# Patient Record
Sex: Male | Born: 1960 | Race: White | Hispanic: No | Marital: Single | Smoking: Never smoker
Health system: Southern US, Community
[De-identification: ages and names within clinical notes are randomized; demographics above are authoritative.]

## PROBLEM LIST (undated history)

## (undated) DIAGNOSIS — J069 Acute upper respiratory infection, unspecified: Secondary | ICD-10-CM

## (undated) DIAGNOSIS — E785 Hyperlipidemia, unspecified: Secondary | ICD-10-CM

## (undated) DIAGNOSIS — K219 Gastro-esophageal reflux disease without esophagitis: Secondary | ICD-10-CM

## (undated) DIAGNOSIS — J45909 Unspecified asthma, uncomplicated: Secondary | ICD-10-CM

## (undated) DIAGNOSIS — E669 Obesity, unspecified: Secondary | ICD-10-CM

## (undated) DIAGNOSIS — I10 Essential (primary) hypertension: Secondary | ICD-10-CM

## (undated) DIAGNOSIS — R0789 Other chest pain: Secondary | ICD-10-CM

## (undated) DIAGNOSIS — I2510 Atherosclerotic heart disease of native coronary artery without angina pectoris: Secondary | ICD-10-CM

## (undated) DIAGNOSIS — E876 Hypokalemia: Secondary | ICD-10-CM

## (undated) HISTORY — DX: Obesity, unspecified: E66.9

## (undated) HISTORY — PX: ADENOIDECTOMY: SUR15

## (undated) HISTORY — DX: Other chest pain: R07.89

## (undated) HISTORY — DX: Unspecified asthma, uncomplicated: J45.909

## (undated) HISTORY — PX: CARDIAC CATHETERIZATION: SHX172

## (undated) HISTORY — DX: Hypokalemia: E87.6

## (undated) HISTORY — DX: Acute upper respiratory infection, unspecified: J06.9

## (undated) HISTORY — PX: HERNIA REPAIR: SHX51

---

## 2000-09-23 ENCOUNTER — Emergency Department (HOSPITAL_COMMUNITY): Admission: EM | Admit: 2000-09-23 | Discharge: 2000-09-23 | Payer: Self-pay | Admitting: Emergency Medicine

## 2007-02-01 ENCOUNTER — Encounter: Admission: RE | Admit: 2007-02-01 | Discharge: 2007-02-01 | Payer: Self-pay | Admitting: Allergy and Immunology

## 2012-10-17 ENCOUNTER — Emergency Department (HOSPITAL_COMMUNITY): Payer: Non-veteran care

## 2012-10-17 ENCOUNTER — Encounter (HOSPITAL_COMMUNITY): Payer: Self-pay | Admitting: Emergency Medicine

## 2012-10-17 ENCOUNTER — Observation Stay (HOSPITAL_COMMUNITY)
Admission: EM | Admit: 2012-10-17 | Discharge: 2012-10-18 | Disposition: A | Discharge status: Home / Self Care | Payer: Non-veteran care | Attending: Internal Medicine | Admitting: Internal Medicine

## 2012-10-17 DIAGNOSIS — R0789 Other chest pain: Secondary | ICD-10-CM | POA: Diagnosis present

## 2012-10-17 DIAGNOSIS — Z79899 Other long term (current) drug therapy: Secondary | ICD-10-CM | POA: Diagnosis not present

## 2012-10-17 DIAGNOSIS — J309 Allergic rhinitis, unspecified: Secondary | ICD-10-CM | POA: Diagnosis not present

## 2012-10-17 DIAGNOSIS — J45909 Unspecified asthma, uncomplicated: Secondary | ICD-10-CM | POA: Insufficient documentation

## 2012-10-17 DIAGNOSIS — Z7982 Long term (current) use of aspirin: Secondary | ICD-10-CM | POA: Diagnosis not present

## 2012-10-17 DIAGNOSIS — R079 Chest pain, unspecified: Secondary | ICD-10-CM | POA: Diagnosis present

## 2012-10-17 DIAGNOSIS — E669 Obesity, unspecified: Secondary | ICD-10-CM | POA: Insufficient documentation

## 2012-10-17 DIAGNOSIS — I10 Essential (primary) hypertension: Secondary | ICD-10-CM | POA: Diagnosis not present

## 2012-10-17 DIAGNOSIS — Z6837 Body mass index (BMI) 37.0-37.9, adult: Secondary | ICD-10-CM | POA: Insufficient documentation

## 2012-10-17 DIAGNOSIS — E876 Hypokalemia: Secondary | ICD-10-CM | POA: Insufficient documentation

## 2012-10-17 DIAGNOSIS — K219 Gastro-esophageal reflux disease without esophagitis: Secondary | ICD-10-CM | POA: Diagnosis not present

## 2012-10-17 HISTORY — DX: Gastro-esophageal reflux disease without esophagitis: K21.9

## 2012-10-17 HISTORY — DX: Essential (primary) hypertension: I10

## 2012-10-17 LAB — CBC
HCT: 38.7 % — ABNORMAL LOW (ref 39.0–52.0)
MCV: 85.6 fL (ref 78.0–100.0)
RDW: 12.9 % (ref 11.5–15.5)
WBC: 8.3 10*3/uL (ref 4.0–10.5)

## 2012-10-17 LAB — POCT I-STAT, CHEM 8
Chloride: 104 mEq/L (ref 96–112)
Creatinine, Ser: 1.2 mg/dL (ref 0.50–1.35)
Glucose, Bld: 144 mg/dL — ABNORMAL HIGH (ref 70–99)
Hemoglobin: 13.9 g/dL (ref 13.0–17.0)
Potassium: 3.4 mEq/L — ABNORMAL LOW (ref 3.5–5.1)

## 2012-10-17 LAB — TROPONIN I: Troponin I: <0.3 ng/mL (ref <0.30)

## 2012-10-17 MED ORDER — ACETAMINOPHEN 650 MG RE SUPP: 650.0000 mg | Freq: Four times a day (QID) | RECTAL | Status: DC | PRN

## 2012-10-17 MED ORDER — MORPHINE SULFATE 4 MG/ML IJ SOLN: 4.0000 mg | Freq: Once | INTRAMUSCULAR | Status: DC

## 2012-10-17 MED ORDER — ENOXAPARIN SODIUM 40 MG/0.4ML ~~LOC~~ SOLN
40.0000 mg | SUBCUTANEOUS | Status: DC
  Administered 2012-10-17: 40 mg via SUBCUTANEOUS
  Filled 2012-10-17 (×2): qty 0.4

## 2012-10-17 MED ORDER — MORPHINE SULFATE 2 MG/ML IJ SOLN: 2.0000 mg | INTRAMUSCULAR | Status: DC | PRN

## 2012-10-17 MED ORDER — POTASSIUM CHLORIDE CRYS ER 20 MEQ PO TBCR
40.0000 meq | EXTENDED_RELEASE_TABLET | Freq: Once | ORAL | Status: AC
  Administered 2012-10-17: 40 meq via ORAL
  Filled 2012-10-17: qty 2

## 2012-10-17 MED ORDER — ONDANSETRON HCL 4 MG PO TABS: 4.0000 mg | ORAL_TABLET | Freq: Four times a day (QID) | ORAL | Status: DC | PRN

## 2012-10-17 MED ORDER — OMEGA-3 FATTY ACIDS 1000 MG PO CAPS: 1.0000 g | ORAL_CAPSULE | Freq: Every day | ORAL | Status: DC

## 2012-10-17 MED ORDER — ACETAMINOPHEN 325 MG PO TABS: 650.0000 mg | ORAL_TABLET | Freq: Four times a day (QID) | ORAL | Status: DC | PRN

## 2012-10-17 MED ORDER — IOHEXOL 350 MG/ML SOLN
80.0000 mL | Freq: Once | INTRAVENOUS | Status: AC | PRN
  Administered 2012-10-17: 80 mL via INTRAVENOUS

## 2012-10-17 MED ORDER — HYDROCHLOROTHIAZIDE 25 MG PO TABS
12.5000 mg | ORAL_TABLET | Freq: Every day | ORAL | Status: DC
  Filled 2012-10-17: qty 0.5

## 2012-10-17 MED ORDER — ONDANSETRON HCL 4 MG/2ML IJ SOLN: 4.0000 mg | Freq: Four times a day (QID) | INTRAMUSCULAR | Status: DC | PRN

## 2012-10-17 MED ORDER — ALUM & MAG HYDROXIDE-SIMETH 200-200-20 MG/5ML PO SUSP: 30.0000 mL | Freq: Four times a day (QID) | ORAL | Status: DC | PRN

## 2012-10-17 MED ORDER — PANTOPRAZOLE SODIUM 40 MG PO TBEC
40.0000 mg | DELAYED_RELEASE_TABLET | Freq: Every day | ORAL | Status: DC
  Administered 2012-10-17 – 2012-10-18 (×2): 40 mg via ORAL
  Filled 2012-10-17 (×2): qty 1

## 2012-10-17 MED ORDER — ASPIRIN EC 325 MG PO TBEC
325.0000 mg | DELAYED_RELEASE_TABLET | Freq: Every day | ORAL | Status: DC
  Administered 2012-10-18: 325 mg via ORAL
  Filled 2012-10-17: qty 1

## 2012-10-17 MED ORDER — HYDROCHLOROTHIAZIDE 12.5 MG PO CAPS
12.5000 mg | ORAL_CAPSULE | Freq: Every day | ORAL | Status: DC
  Administered 2012-10-17 – 2012-10-18 (×2): 12.5 mg via ORAL
  Filled 2012-10-17 (×3): qty 1

## 2012-10-17 MED ORDER — OMEGA-3-ACID ETHYL ESTERS 1 G PO CAPS
1.0000 g | ORAL_CAPSULE | Freq: Every day | ORAL | Status: DC
  Administered 2012-10-17 – 2012-10-18 (×2): 1 g via ORAL
  Filled 2012-10-17 (×2): qty 1

## 2012-10-17 MED ORDER — IOHEXOL 350 MG/ML SOLN
100.0000 mL | Freq: Once | INTRAVENOUS | Status: AC | PRN
  Administered 2012-10-17: 100 mL via INTRAVENOUS

## 2012-10-17 MED ORDER — NITROGLYCERIN 0.4 MG SL SUBL
0.4000 mg | SUBLINGUAL_TABLET | SUBLINGUAL | Status: DC | PRN
  Filled 2012-10-17: qty 25

## 2012-10-17 MED ORDER — LORATADINE 10 MG PO TABS
10.0000 mg | ORAL_TABLET | Freq: Every day | ORAL | Status: DC
  Administered 2012-10-17 – 2012-10-18 (×2): 10 mg via ORAL
  Filled 2012-10-17 (×2): qty 1

## 2012-10-17 MED ORDER — SODIUM CHLORIDE 0.9 % IJ SOLN
3.0000 mL | Freq: Two times a day (BID) | INTRAMUSCULAR | Status: DC
  Administered 2012-10-17 – 2012-10-18 (×3): 3 mL via INTRAVENOUS

## 2012-10-17 MED ORDER — ASPIRIN 81 MG PO CHEW
324.0000 mg | CHEWABLE_TABLET | Freq: Once | ORAL | Status: AC
  Administered 2012-10-17: 324 mg via ORAL
  Filled 2012-10-17: qty 4

## 2012-10-17 NOTE — ED Notes (Signed)
Pt states that he gave platelets on the 21 of feb and was seen at the [place] after that and had blood drawn for his physical. States takes 1/2 HCTZ for bp med. Pt aaox4 MAE w/ purpose able to ambulate w/o diff

## 2012-10-17 NOTE — ED Notes (Signed)
Report given Lelon Mast, RN. Nurse has no further questions upon report given. Pt being transported to floor by Beaulah Dinning, EMT

## 2012-10-17 NOTE — ED Notes (Signed)
Pt states he was unable to sleep last night and thought it was just indigestion and tried throwing up but was unable to. Pt alert and resting in bed currently. Pt does not show signs of acute distress.

## 2012-10-17 NOTE — ED Notes (Signed)
Tightness in chest was uncomfortable trying to sleep lasr night could not sleep on back  Or side  Some nausea no sob

## 2012-10-17 NOTE — H&P (Addendum)
[facility] History and Physical  Harold Warner ZOX:[id number] DOB: [birth year] DOA: 10/17/2012  Referring physician: Dr. Eber Hong PCP: Jyl Heinz, MD /Dr. Lupita Raider Specialists:  None  Chief Complaint: Chest pain.  HPI: Harold Warner is a 52 y.o. male with history of HTN,? Asthma, self diagnosed reflux presented to ED on 10/17/12 with chest pain. 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 He went to bed at approximately midnight and then woke up 20 minutes later with midsternal tightness/pressure like sensation, which he rated as 4-5/10 in severity, nonradiating, associated with mild dyspnea but no diaphoresis. He felt like something was stuck in his throat and tried to vomit but nothing came out. He was restless all night with continued chest discomfort. He tried Pepto-Bismol which did not help. This morning he called the [place] and was advised to come to the ED for further evaluation. In the ED, EKG and troponin x1 were negative. Chest x-ray showed abnormality of cardiac silhouette. CTA chest was thereby done and was negative for PE and no other acute findings. Chest pain has resolved.  Review of Systems:  all systems reviewed and apart from history of presenting illness, pertinent for intermittent heart burns (once every 2 months), relieved by TUMS. All other systems were negative.    Past Medical History  Diagnosis Date  . Hypertension   . Acid reflux    Past Surgical History  Procedure Laterality Date  . Hernia repair    . Adenoidectomy     Social History:  reports that he has never smoked. He does not have any smokeless tobacco history on file. He reports that  drinks alcohol. He reports that he does not use illicit drugs.  He is single. Independent of activities of daily living. Occasionally drinks a beer.   Allergies  Allergen Reactions  . Adhesive (Tape) Rash    Family History  Problem Relation Age of  Onset  . Hypertension Mother     Prior to Admission medications   Medication Sig Start Date End Date Taking? Authorizing Provider  aspirin EC 81 MG tablet Take 81 mg by mouth daily.   Yes Historical Provider, MD  cetirizine (ZYRTEC) 10 MG tablet Take 10 mg by mouth daily.   Yes Historical Provider, MD  fish oil-omega-3 fatty acids 1000 MG capsule Take 1 g by mouth daily.   Yes Historical Provider, MD  hydrochlorothiazide (HYDRODIURIL) 25 MG tablet Take 12.5 mg by mouth daily.   Yes Historical Provider, MD   Physical Exam: Filed Vitals:   10/17/12 0733 10/17/12 1039 10/17/12 1235 10/17/12 1306  BP: 140/81 130/74 133/88 145/90  Pulse: 96 88 84 87  Temp: 98.1 F (36.7 C)   98.3 F (36.8 C)  TempSrc: Oral   Oral  Resp: 17 15 18 16   Height:  5\' 7"  (1.702 m)    Weight:  109.77 kg (242 lb)    SpO2: 97% 96% 95% 96%     General exam: Moderately built and  obese male patient, lying comfortably supine on the gurney in no obvious distress.  Head, eyes and ENT: Nontraumatic and normocephalic. Pupils equally reacting to light and accommodation. Oral mucosa moist.  Neck: Supple. No JVD, carotid bruit or thyromegaly.  Lymphatics: No lymphadenopathy.  Respiratory system: Clear to auscultation. No increased work of breathing.  Cardiovascular system: S1 and S2 heard, RRR. No JVD, murmurs, gallops, clicks or pedal edema.  Gastrointestinal system: Abdomen is  obese/nondistended,  soft and nontender. Normal bowel sounds heard. No organomegaly or masses appreciated.  Central nervous system: Alert and oriented. No focal neurological deficits.  Extremities: Symmetric 5 x 5 power. Peripheral pulses symmetrically felt.  Skin: No rashes or acute findings.  Musculoskeletal system: Negative exam.  Psychiatry: Pleasant and cooperative.   Labs on Admission:  Basic Metabolic Panel:  Recent Labs Lab 10/17/12 0802  NA 142  K 3.4*  CL 104  GLUCOSE 144*  BUN 9  CREATININE 1.20   Liver  Function Tests: No results found for this basename: AST, ALT, ALKPHOS, BILITOT, PROT, ALBUMIN,  in the last 168 hours  Recent Labs Lab 10/17/12 0805  LIPASE 51   No results found for this basename: AMMONIA,  in the last 168 hours CBC:  Recent Labs Lab 10/17/12 0802 10/17/12 0805  WBC  --  8.3  HGB 13.9 14.0  HCT 41.0 38.7*  MCV  --  85.6  PLT  --  270   Cardiac Enzymes: No results found for this basename: CKTOTAL, CKMB, CKMBINDEX, TROPONINI,  in the last 168 hours  BNP (last 3 results) No results found for this basename: PROBNP,  in the last 8760 hours CBG: No results found for this basename: GLUCAP,  in the last 168 hours  Radiological Exams on Admission: Dg Chest 2 View  10/17/2012  *RADIOLOGY REPORT*  Clinical Data: Chest pain and shortness of breath.  CHEST - 2 VIEW  Comparison: Chest x-ray 02/01/2007.  Findings: Again noted is an unusual contour of the cardiac silhouette with prominence of the right atrial contour on the frontal projection.  No acute consolidative airspace disease.  No pleural effusions.  Pulmonary vasculature is normal.  The upper mediastinal contours are within normal limits.  IMPRESSION: 1.  No radiographic evidence of acute cardiopulmonary disease. 2.  Unusual appearance of the cardiac silhouette suggests right atrial dilatation.  This could be better evaluated with echocardiography if clinically indicated.   Original Report Authenticated By: Trudie Reed, M.D.    Ct Angio Chest Pe W/cm &/or Wo Cm  10/17/2012  *RADIOLOGY REPORT*  Clinical Data: Chest pain.  CT ANGIOGRAPHY CHEST  Technique:  Multidetector CT imaging of the chest using the standard protocol during bolus administration of intravenous contrast. Multiplanar reconstructed images including MIPs were obtained and reviewed to evaluate the vascular anatomy.  Contrast: 180 ml Omnipaque 300 IV.  Comparison: Chest x-ray earlier today.  Findings: Initial study demonstrated poor opacification of the  pulmonary arteries.  Therefore, the study was repeated.  No filling defects in the pulmonary arteries to suggest pulmonary emboli. Heart is normal size.  Prominence of the mediastinum on chest x-ray likely related to prominent mediastinal fat seen on this CT. No mediastinal, hilar, or axillary adenopathy.  Lungs are clear.  No effusions. Imaging into the upper abdomen shows no acute findings.  IMPRESSION: No evidence of pulmonary embolus.  No acute cardiopulmonary disease.  Prominence of the cardiac silhouette on chest x-ray related to prominent mediastinal fat.   Original Report Authenticated By: Charlett Nose, M.D.     EKG: Independently reviewed.  sinus rhythm without acute changes   Assessment/Plan Principal Problem:   Atypical chest pain Active Problems:   Obesity   GERD (gastroesophageal reflux disease)   HTN (hypertension)   Hypokalemia   1. Atypical chest pain: DD-ACS or GERD. Admit to telemetry for observation. Cycle cardiac enzymes. Continue aspirin. When necessary NTG. [facility] cardiology consulted for possible stress test-in versus outpatient. 2. Hypertension: Continue HCTZ. Mildly uncontrolled.  3. Hypokalemia: Likely secondary to HCTZ. Replete 4. GERD: PPI. 5. Obesity: Counseled regarding weight loss. 6. Asthma: Stable.     Code Status:  Full   Family Communication:  discussed with patient's mother at bedside.   Disposition Plan:  home when medically stable    Time spent: 45 minutes.  [facility] Pager [phone]  If 7PM-7AM, please contact night-coverage [email or web address] Password [facility] 10/17/2012, 1:41 PM

## 2012-10-17 NOTE — ED Notes (Signed)
Verified with Dr Hyacinth Meeker that we can hold morphine as PRN due to pt is not currently having chest pain.

## 2012-10-17 NOTE — ED Notes (Signed)
Patient transported to X-ray 

## 2012-10-17 NOTE — ED Provider Notes (Signed)
History     CSN: [id number]  Arrival date & time 10/17/12  0725   First MD Initiated Contact with Patient 10/17/12 [phone]      Chief Complaint  Patient presents with  . Chest Pain    (Consider location/radiation/quality/duration/timing/severity/associated sxs/prior treatment) HPI Comments: 52 yo male hx seasonal allergies, ?hypertension, hernia repair, non smoker, minimal ETOH, no illicit drugs presents with chest pain. Began last night after eating [place] food. Located lower sternal region, tight in nature. Is not exertional and non pleuritic. Patient had trouble sleeping with pain last night. Has not had this in the past. Pain does not radiate, no back pain. He has no history of ACS, never been stressed or cathed, no history of pulmonary embolism. No FH of early MI or pulmonary embolism. Patient has not had recent surgeries, and no unilateral leg swelling.   The history is provided by the patient. No language interpreter was used.    Past Medical History  Diagnosis Date  . Hypertension   . Acid reflux     Past Surgical History  Procedure Laterality Date  . Hernia repair      No family history on file.  History  Substance Use Topics  . Smoking status: Never Smoker   . Smokeless tobacco: Not on file  . Alcohol Use: Yes      Review of Systems  Constitutional: Negative for fever and chills.  HENT: Negative for sore throat and neck pain.   Eyes: Negative for visual disturbance.  Respiratory: Negative for cough and shortness of breath.   Cardiovascular: Positive for chest pain.  Gastrointestinal: Negative for nausea, vomiting, abdominal pain and diarrhea.  Genitourinary: Negative for dysuria and frequency.  Musculoskeletal: Negative for back pain.  Skin: Negative for rash.  Neurological: Negative for weakness, numbness and headaches.  Hematological: Negative for adenopathy.  Psychiatric/Behavioral: Negative for behavioral problems.    Allergies  Adhesive  Home  Medications   Current Outpatient Rx  Name  Route  Sig  Dispense  Refill  . aspirin EC 81 MG tablet   Oral   Take 81 mg by mouth daily.         . cetirizine (ZYRTEC) 10 MG tablet   Oral   Take 10 mg by mouth daily.         . fish oil-omega-3 fatty acids 1000 MG capsule   Oral   Take 1 g by mouth daily.         . hydrochlorothiazide (HYDRODIURIL) 25 MG tablet   Oral   Take 12.5 mg by mouth daily.           BP 133/88  Pulse 84  Temp(Src) 98.1 F (36.7 C) (Oral)  Resp 18  Ht 5\' 7"  (1.702 m)  Wt 242 lb (109.77 kg)  BMI 37.89 kg/m2  SpO2 95%  Physical Exam  Nursing note and vitals reviewed. Constitutional: He appears well-developed and well-nourished. No distress.  Obese male in no acute distress. Pulse 90's.  HENT:  Head: Normocephalic and atraumatic.  Mouth/Throat: Oropharynx is clear and moist. No oropharyngeal exudate.  Eyes: Conjunctivae and EOM are normal. Pupils are equal, round, and reactive to light. Right eye exhibits no discharge. Left eye exhibits no discharge. No scleral icterus.  Neck: Normal range of motion. Neck supple. No JVD present. No thyromegaly present.  Cardiovascular: Normal rate, regular rhythm, normal heart sounds and intact distal pulses.  Exam reveals no gallop and no friction rub.   No murmur heard. Pulmonary/Chest: Effort normal  and breath sounds normal. No respiratory distress. He has no wheezes. He has no rales.  Lungs are clear. Chest wall is non tender.  Abdominal: Soft. Bowel sounds are normal. He exhibits no distension and no mass. There is no tenderness.  Musculoskeletal: Normal range of motion. He exhibits no edema and no tenderness.  No unilateral leg swelling, no pitting edema.  Lymphadenopathy:    He has no cervical adenopathy.  Neurological: He is alert. Coordination normal.  Skin: Skin is warm and dry. No rash noted. No erythema.  Psychiatric: He has a normal mood and affect. His behavior is normal.    ED Course   Procedures (including critical care time)  Labs Reviewed  CBC - Abnormal; Notable for the following:    HCT 38.7 (*)    MCHC 36.2 (*)    All other components within normal limits  POCT I-STAT, CHEM 8 - Abnormal; Notable for the following:    Potassium 3.4 (*)    Glucose, Bld 144 (*)    All other components within normal limits  LIPASE, BLOOD  POCT I-STAT TROPONIN I   Dg Chest 2 View  10/17/2012  *RADIOLOGY REPORT*  Clinical Data: Chest pain and shortness of breath.  CHEST - 2 VIEW  Comparison: Chest x-ray 02/01/2007.  Findings: Again noted is an unusual contour of the cardiac silhouette with prominence of the right atrial contour on the frontal projection.  No acute consolidative airspace disease.  No pleural effusions.  Pulmonary vasculature is normal.  The upper mediastinal contours are within normal limits.  IMPRESSION: 1.  No radiographic evidence of acute cardiopulmonary disease. 2.  Unusual appearance of the cardiac silhouette suggests right atrial dilatation.  This could be better evaluated with echocardiography if clinically indicated.   Original Report Authenticated By: Trudie Reed, M.D.    Ct Angio Chest Pe W/cm &/or Wo Cm  10/17/2012  *RADIOLOGY REPORT*  Clinical Data: Chest pain.  CT ANGIOGRAPHY CHEST  Technique:  Multidetector CT imaging of the chest using the standard protocol during bolus administration of intravenous contrast. Multiplanar reconstructed images including MIPs were obtained and reviewed to evaluate the vascular anatomy.  Contrast: 180 ml Omnipaque 300 IV.  Comparison: Chest x-ray earlier today.  Findings: Initial study demonstrated poor opacification of the pulmonary arteries.  Therefore, the study was repeated.  No filling defects in the pulmonary arteries to suggest pulmonary emboli. Heart is normal size.  Prominence of the mediastinum on chest x-ray likely related to prominent mediastinal fat seen on this CT. No mediastinal, hilar, or axillary adenopathy.   Lungs are clear.  No effusions. Imaging into the upper abdomen shows no acute findings.  IMPRESSION: No evidence of pulmonary embolus.  No acute cardiopulmonary disease.  Prominence of the cardiac silhouette on chest x-ray related to prominent mediastinal fat.   Original Report Authenticated By: Charlett Nose, M.D.      1. Chest pain       MDM  52 yo male presents with chest tightness. Has obesity, age, and sex as ACS risk factors, and does use ASA daily. No risk factors for PE, would not CTA or order d-dimer on him as do not suspect this clinically. DDx: ACS, GERD, costochondritis, PUD, aortic dissection. Will give ASA, Nitro SL, labs, troponin I, and CXR. If CXR, enzymes are negative will need inpatient admission for serial enzymes and stress test.   The patient is a negative CT scan of the chest that does not show any signs of dissection or pulmonary  embolism, he is currently chest pain-free though it has been gradual improvement. I have discussed his care with the hospitalist who will admit the patient to telemetry for a rule out.  Trop neg  ED ECG REPORT  I personally interpreted this EKG   Date: 10/17/2012   Rate: 94  Rhythm: normal sinus rhythm  QRS Axis: normal  Intervals: normal  ST/T Wave abnormalities: normal  Conduction Disutrbances:none  Narrative Interpretation:   Old EKG Reviewed: none available    Vida Roller, MD 10/17/12 1241

## 2012-10-17 NOTE — ED Notes (Signed)
To ct

## 2012-10-17 NOTE — ED Notes (Signed)
Internal medicine at bedside

## 2012-10-17 NOTE — Consult Note (Signed)
Admit date: 10/17/2012 Referring Physician   Marcellus Scott Primary Physician  K. Clelia Croft, MD Primary Cardiologist  HWBSmith, III, MD Reason for Consultation  Chest Pain  ASSESSMENT: !. Chest pain, with MI ruled out 2. Hypertension, controlled. 3. Seasonal allergies  PLAN:  1. Stress cardiolite 2. Serial cardiac markers and EKG. If unremarkable by a.m. we will proceed with #1.   HPI: After the [employer] overtime basketball game he developed severe pressure in his chest that lasted through the night. He feels better now. He has never had similar discomfort. He denies dyspnea and exertional complaints but admits to significant sedentary lifestyle.   PMH:   Past Medical History  Diagnosis Date  . Hypertension   . Acid reflux      PSH:   Past Surgical History  Procedure Laterality Date  . Hernia repair    . Adenoidectomy      Allergies:  Adhesive Prior to Admit Meds:   Prescriptions prior to admission  Medication Sig Dispense Refill  . aspirin EC 81 MG tablet Take 81 mg by mouth daily.      . cetirizine (ZYRTEC) 10 MG tablet Take 10 mg by mouth daily.      . fish oil-omega-3 fatty acids 1000 MG capsule Take 1 g by mouth daily.      . hydrochlorothiazide (HYDRODIURIL) 25 MG tablet Take 12.5 mg by mouth daily.       Fam HX:    Family History  Problem Relation Age of Onset  . Hypertension Mother    Social HX:    History   Social History  . Marital Status: Single    Spouse Name: N/A    Number of Children: N/A  . Years of Education: N/A   Occupational History  . Not on file.   Social History Main Topics  . Smoking status: Never Smoker   . Smokeless tobacco: Not on file  . Alcohol Use: Yes  . Drug Use: No  . Sexually Active: Not on file   Other Topics Concern  . Not on file   Social History Narrative  . No narrative on file     Review of Systems: Colon polyps, obesity, no DM, no lipid problems, no claudication, and no lung or kidney  disease.  Physical Exam: Blood pressure 135/85, pulse 81, temperature 98.3 F (36.8 C), temperature source Oral, resp. rate 18, height 5\' 7"  (1.702 m), weight 109.77 kg (242 lb), SpO2 98.00%. Weight change:    Overall appearance is unremarkable. He is comfortable. Lying flat on his bed.  HEENT exam is unremarkable.  NECK exam reveals no JVD or unusual pulsatile masses. No carotid bruits are heard. The thyroid is not palpable.  CHEST exam is unremarkable. No wheezing or rales are heard. There is no chest wall tenderness  CARDIAC exam reveals no murmur, gallop, rub, or click. Rhythm is regular.  ABDOMINAL exam is unremarkable. No tenderness, ascites, or bruits are heard. No masses are noted.  EXTREMITIES reveal no edema. Pulses are 2+ and symmetric.  NEUROLOGICAL exam reveals no obvious motor sensory deficits. Cranial nerves are intact. Speech pattern is normal.   Labs:   Lab Results  Component Value Date   WBC 8.3 10/17/2012   HGB 14.0 10/17/2012   HCT 38.7* 10/17/2012   MCV 85.6 10/17/2012   PLT 270 10/17/2012    Recent Labs Lab 10/17/12 0802  NA 142  K 3.4*  CL 104  BUN 9  CREATININE 1.20  GLUCOSE  144*    Radiology:  Dg Chest 2 View  10/17/2012  *RADIOLOGY REPORT*  Clinical Data: Chest pain and shortness of breath.  CHEST - 2 VIEW  Comparison: Chest x-ray 02/01/2007.  Findings: Again noted is an unusual contour of the cardiac silhouette with prominence of the right atrial contour on the frontal projection.  No acute consolidative airspace disease.  No pleural effusions.  Pulmonary vasculature is normal.  The upper mediastinal contours are within normal limits.  IMPRESSION: 1.  No radiographic evidence of acute cardiopulmonary disease. 2.  Unusual appearance of the cardiac silhouette suggests right atrial dilatation.  This could be better evaluated with echocardiography if clinically indicated.   Original Report Authenticated By: Trudie Reed, M.D.    Ct Angio Chest Pe  W/cm &/or Wo Cm  10/17/2012  *RADIOLOGY REPORT*  Clinical Data: Chest pain.  CT ANGIOGRAPHY CHEST  Technique:  Multidetector CT imaging of the chest using the standard protocol during bolus administration of intravenous contrast. Multiplanar reconstructed images including MIPs were obtained and reviewed to evaluate the vascular anatomy.  Contrast: 180 ml Omnipaque 300 IV.  Comparison: Chest x-ray earlier today.  Findings: Initial study demonstrated poor opacification of the pulmonary arteries.  Therefore, the study was repeated.  No filling defects in the pulmonary arteries to suggest pulmonary emboli. Heart is normal size.  Prominence of the mediastinum on chest x-ray likely related to prominent mediastinal fat seen on this CT. No mediastinal, hilar, or axillary adenopathy.  Lungs are clear.  No effusions. Imaging into the upper abdomen shows no acute findings.  IMPRESSION: No evidence of pulmonary embolus.  No acute cardiopulmonary disease.  Prominence of the cardiac silhouette on chest x-ray related to prominent mediastinal fat.   Original Report Authenticated By: Charlett Nose, M.D.    EKG:   NSR and no acute changes    Lesleigh Noe 10/17/2012 4:10 PM

## 2012-10-17 NOTE — ED Notes (Signed)
Report taken from [place], [place]. Pt remains in Radiology

## 2012-10-17 NOTE — ED Notes (Signed)
Pt states he does not have chest pain currently; pt alert and mentating appropriately; pt states "I just couldn't sleep last night."

## 2012-10-18 ENCOUNTER — Encounter (HOSPITAL_COMMUNITY): Payer: Self-pay | Admitting: *Deleted

## 2012-10-18 ENCOUNTER — Observation Stay (HOSPITAL_COMMUNITY): Payer: Non-veteran care

## 2012-10-18 DIAGNOSIS — K219 Gastro-esophageal reflux disease without esophagitis: Secondary | ICD-10-CM

## 2012-10-18 DIAGNOSIS — R079 Chest pain, unspecified: Secondary | ICD-10-CM

## 2012-10-18 LAB — TROPONIN I: Troponin I: <0.3 ng/mL (ref <0.30)

## 2012-10-18 MED ORDER — TECHNETIUM TC 99M SESTAMIBI - CARDIOLITE
30.0000 | Freq: Once | INTRAVENOUS | Status: AC | PRN
  Administered 2012-10-18: 30 via INTRAVENOUS

## 2012-10-18 MED ORDER — PANTOPRAZOLE SODIUM 40 MG PO TBEC: 40.0000 mg | DELAYED_RELEASE_TABLET | Freq: Every day | ORAL | Status: DC

## 2012-10-18 MED ORDER — TECHNETIUM TC 99M SESTAMIBI - CARDIOLITE
10.0000 | Freq: Once | INTRAVENOUS | Status: AC | PRN
  Administered 2012-10-18: 10 via INTRAVENOUS

## 2012-10-18 NOTE — Progress Notes (Signed)
D/c orders received;IV removed with gauze on, pt remains in stable condition, pt meds and instructions reviewed and given to pt; pt d/c to home 

## 2012-10-18 NOTE — Progress Notes (Signed)
Patient Name: Harold Warner Date of Encounter: 10/18/2012    SUBJECTIVE:   He is doing well and denies chest pain overnight.  TELEMETRY:  NSR Filed Vitals:   10/17/12 1306 10/17/12 1359 10/17/12 2100 10/18/12 0600  BP: 145/90 135/85 139/92 131/78  Pulse: 87 81 80 75  Temp: 98.3 F (36.8 C) 98.3 F (36.8 C) 98.1 F (36.7 C) 98 F (36.7 C)  TempSrc: Oral Oral    Resp: 16 18 18 15   Height:      Weight:    108.364 kg (238 lb 14.4 oz)  SpO2: 96% 98% 98% 96%    Intake/Output Summary (Last 24 hours) at 10/18/12 1610 Last data filed at 10/17/12 1746  Gross per 24 hour  Intake    240 ml  Output      0 ml  Net    240 ml    LABS: Basic Metabolic Panel:  Recent Labs  96/04/54 0802  NA 142  K 3.4*  CL 104  GLUCOSE 144*  BUN 9  CREATININE 1.20   CBC:  Recent Labs  10/17/12 0802 10/17/12 0805  WBC  --  8.3  HGB 13.9 14.0  HCT 41.0 38.7*  MCV  --  85.6  PLT  --  270   Cardiac Enzymes:  Recent Labs  10/17/12 1627 10/17/12 1959 10/18/12 0150  TROPONINI <0.30 <0.30 <0.30   Radiology/Studies:  Unremarkable  ECG: normal Physical Exam: Blood pressure 131/78, pulse 75, temperature 98 F (36.7 C), temperature source Oral, resp. rate 15, height 5\' 7"  (1.702 m), weight 108.364 kg (238 lb 14.4 oz), SpO2 96.00%. Weight change:    Chest is clear and there are no wheezes  Cardiac is unremarkable   ASSESSMENT:  1. No evidence MI and denies recurrent chest pain.  Plan:  1. Nuclear this AM and home if he has no ischemia on nuclear.  Selinda Eon 10/18/2012, 8:22 AM

## 2012-10-18 NOTE — Discharge Summary (Signed)
Physician Discharge Summary  Patient ID: Harold Warner MRN: [id number] DOB/AGE: 1960/11/14 52 y.o.  Admit date: 10/17/2012 Discharge date: 10/18/2012  Primary Care Physician:  Jyl Heinz, MD  Discharge Diagnoses:    . Atypical chest pain . Obesity . GERD (gastroesophageal reflux disease) . HTN (hypertension) . Hypokalemia  Consults:  [facility]  Discharge Instructions:   Discharge Medications:   Medication List    TAKE these medications       aspirin EC 81 MG tablet  Take 81 mg by mouth daily.     cetirizine 10 MG tablet  Commonly known as:  ZYRTEC  Take 10 mg by mouth daily.     fish oil-omega-3 fatty acids 1000 MG capsule  Take 1 g by mouth daily.     hydrochlorothiazide 25 MG tablet  Commonly known as:  HYDRODIURIL  Take 12.5 mg by mouth daily.     pantoprazole 40 MG tablet  Commonly known as:  PROTONIX  Take 1 tablet (40 mg total) by mouth daily with breakfast.         Brief H and P: For complete details please refer to admission H and P, but in brief Harold Warner is a 52 y.o. male with history of HTN,? Asthma, self diagnosed reflux presented to ED on 10/17/12 with chest pain. 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 He went to bed at approximately midnight and then woke up 20 minutes later with midsternal tightness/pressure like sensation, which he rated as 4-5/10 in severity, nonradiating, associated with mild dyspnea but no diaphoresis. He felt like something was stuck in his throat and tried to vomit but nothing came out. He was restless all night with continued chest discomfort. He tried Pepto-Bismol which did not help. This morning he called the [place] and was advised to come to the ED for further evaluation. In the ED, EKG and troponin x1 were negative. Chest x-ray showed abnormality of cardiac silhouette. CTA chest was thereby done and was negative for PE and no other acute findings. Chest  pain has resolved.   Hospital Course:  Atypical chest pain: DD-ACS or GERD. Patient was admitted for observation, 3 sets of cardiac enzymes were normal.  [facility] consulted, EKG did not show any acute ST-T wave changes. CT angio chest was negative for acute PE. Patient underwent stress test today which showed no scintigraphic evidence of prior infarction or exercise- induced ischemia. Tthe patient was able to achieve 101% of maximum, age predicted heart rate. Normal wall motion. Ejection fraction - 68%. Hypertension: Continue HCTZ.  Hypokalemia: Likely secondary to HCTZ, replaced GERD: placed on PPI, patient had been taking tums PRN. Obesity: Counseled regarding weight loss. Asthma: Stable.   Day of Discharge BP 112/50  Pulse 75  Temp(Src) 98 F (36.7 C) (Oral)  Resp 15  Ht 5\' 7"  (1.702 m)  Wt 108.364 kg (238 lb 14.4 oz)  BMI 37.41 kg/m2  SpO2 96%  Physical Exam: General: Alert and awake oriented x3 not in any acute distress. HEENT: anicteric sclera, pupils reactive to light and accommodation CVS: S1-S2 clear no murmur rubs or gallops Chest: clear to auscultation bilaterally, no wheezing rales or rhonchi Abdomen: soft nontender, nondistended, normal bowel sounds, no organomegaly Extremities: no cyanosis, clubbing or edema noted bilaterally Neuro: Cranial nerves II-XII intact, no focal neurological deficits   The results of significant diagnostics from this hospitalization (including imaging, microbiology, ancillary and laboratory) are listed below for reference.  LAB RESULTS: Basic Metabolic Panel:  Recent Labs Lab 10/17/12 0802  NA 142  K 3.4*  CL 104  GLUCOSE 144*  BUN 9  CREATININE 1.20    Recent Labs Lab 10/17/12 0805  LIPASE 51   CBC:  Recent Labs Lab 10/17/12 0802 10/17/12 0805  WBC  --  8.3  HGB 13.9 14.0  HCT 41.0 38.7*  MCV  --  85.6  PLT  --  270   Cardiac Enzymes:  Recent Labs Lab 10/17/12 1959 10/18/12 0150  TROPONINI <0.30  <0.30   BNP: No components found with this basename: POCBNP,  CBG: No results found for this basename: GLUCAP,  in the last 168 hours  Significant Diagnostic Studies:  Dg Chest 2 View  10/17/2012  *RADIOLOGY REPORT*  Clinical Data: Chest pain and shortness of breath.  CHEST - 2 VIEW  Comparison: Chest x-ray 02/01/2007.  Findings: Again noted is an unusual contour of the cardiac silhouette with prominence of the right atrial contour on the frontal projection.  No acute consolidative airspace disease.  No pleural effusions.  Pulmonary vasculature is normal.  The upper mediastinal contours are within normal limits.  IMPRESSION: 1.  No radiographic evidence of acute cardiopulmonary disease. 2.  Unusual appearance of the cardiac silhouette suggests right atrial dilatation.  This could be better evaluated with echocardiography if clinically indicated.   Original Report Authenticated By: Trudie Reed, M.D.    Ct Angio Chest Pe W/cm &/or Wo Cm  10/17/2012  *RADIOLOGY REPORT*  Clinical Data: Chest pain.  CT ANGIOGRAPHY CHEST  Technique:  Multidetector CT imaging of the chest using the standard protocol during bolus administration of intravenous contrast. Multiplanar reconstructed images including MIPs were obtained and reviewed to evaluate the vascular anatomy.  Contrast: 180 ml Omnipaque 300 IV.  Comparison: Chest x-ray earlier today.  Findings: Initial study demonstrated poor opacification of the pulmonary arteries.  Therefore, the study was repeated.  No filling defects in the pulmonary arteries to suggest pulmonary emboli. Heart is normal size.  Prominence of the mediastinum on chest x-ray likely related to prominent mediastinal fat seen on this CT. No mediastinal, hilar, or axillary adenopathy.  Lungs are clear.  No effusions. Imaging into the upper abdomen shows no acute findings.  IMPRESSION: No evidence of pulmonary embolus.  No acute cardiopulmonary disease.  Prominence of the cardiac silhouette on  chest x-ray related to prominent mediastinal fat.   Original Report Authenticated By: Charlett Nose, M.D.        Disposition and Follow-up:     Discharge Orders   Future Appointments Provider Department Dept Phone   10/19/2012 7:05 AM [facility] [phone]   10/19/2012 8:05 AM [facility] [phone]   Future Orders Complete By Expires     Diet - low sodium heart healthy  As directed     Increase activity slowly  As directed         DISPOSITION: home DIET: heart healthy diet  ACTIVITY: as tolerated   DISCHARGE FOLLOW-UP Follow-up Information   Follow up with ARMOUR, ROSS B, MD. Schedule an appointment as soon as possible for a visit in 2 weeks. (for hospital follow-up)    Contact information:   [address] [place] [address] [phone]       Time spent on Discharge: 35 mins  Signed:   RAI,RIPUDEEP M.D. [facility] 10/18/2012, 12:30 PM Pager: [phone]

## 2012-10-19 ENCOUNTER — Encounter (HOSPITAL_COMMUNITY): Payer: Non-veteran care

## 2014-02-11 ENCOUNTER — Encounter: Payer: Self-pay | Admitting: Interventional Cardiology

## 2014-02-11 ENCOUNTER — Encounter: Payer: Self-pay | Admitting: *Deleted

## 2014-02-11 DIAGNOSIS — E876 Hypokalemia: Secondary | ICD-10-CM | POA: Insufficient documentation

## 2014-02-11 DIAGNOSIS — J45909 Unspecified asthma, uncomplicated: Secondary | ICD-10-CM | POA: Insufficient documentation

## 2017-08-01 ENCOUNTER — Encounter (HOSPITAL_BASED_OUTPATIENT_CLINIC_OR_DEPARTMENT_OTHER): Payer: Self-pay | Admitting: *Deleted

## 2017-08-01 ENCOUNTER — Emergency Department (HOSPITAL_BASED_OUTPATIENT_CLINIC_OR_DEPARTMENT_OTHER)
Admission: EM | Admit: 2017-08-01 | Discharge: 2017-08-01 | Disposition: A | Discharge status: Home / Self Care | Payer: Worker's Compensation | Attending: Emergency Medicine | Admitting: Emergency Medicine

## 2017-08-01 ENCOUNTER — Emergency Department (HOSPITAL_BASED_OUTPATIENT_CLINIC_OR_DEPARTMENT_OTHER): Payer: Worker's Compensation

## 2017-08-01 ENCOUNTER — Other Ambulatory Visit: Payer: Self-pay

## 2017-08-01 DIAGNOSIS — J45909 Unspecified asthma, uncomplicated: Secondary | ICD-10-CM | POA: Insufficient documentation

## 2017-08-01 DIAGNOSIS — Z042 Encounter for examination and observation following work accident: Secondary | ICD-10-CM | POA: Insufficient documentation

## 2017-08-01 DIAGNOSIS — M545 Low back pain, unspecified: Secondary | ICD-10-CM

## 2017-08-01 DIAGNOSIS — W009XXA Unspecified fall due to ice and snow, initial encounter: Secondary | ICD-10-CM | POA: Insufficient documentation

## 2017-08-01 DIAGNOSIS — Z79899 Other long term (current) drug therapy: Secondary | ICD-10-CM | POA: Insufficient documentation

## 2017-08-01 DIAGNOSIS — I10 Essential (primary) hypertension: Secondary | ICD-10-CM | POA: Diagnosis not present

## 2017-08-01 DIAGNOSIS — Z7982 Long term (current) use of aspirin: Secondary | ICD-10-CM | POA: Diagnosis not present

## 2017-08-01 MED ORDER — METHOCARBAMOL 500 MG PO TABS: 500.0000 mg | ORAL_TABLET | Freq: Two times a day (BID) | ORAL | 0 refills | Status: DC

## 2017-08-01 MED ORDER — METHOCARBAMOL 500 MG PO TABS
500.0000 mg | ORAL_TABLET | Freq: Once | ORAL | Status: AC
  Administered 2017-08-01: 500 mg via ORAL
  Filled 2017-08-01: qty 1

## 2017-08-01 NOTE — ED Provider Notes (Signed)
[facility] EMERGENCY DEPARTMENT Provider Note   CSN: [id number] Arrival date & time: 08/01/17  1759     History   Chief Complaint Chief Complaint  Patient presents with  . Fall    HPI Harold Warner is a 56 y.o. male.  HPI  [age] y.o. male with a hx of HTN, Asthma, presents to the Emergency Department today due to right lower back pain. This occurred x 12 hours ago at work. Notes slipping on ice and landing on right lower back. No head trauma or LOC. No headache or visual changes. Notes focal pain to right lower back. No numbness/tingling. No saddle anesthesia. No loss of bowel or bladder function. Rates pain 3/10. Describes as tightness. No CP/SOB. No other symptoms noted   Past Medical History:  Diagnosis Date  . Acid reflux   . Asthma   . Atypical chest pain   . Hypertension   . Hypokalemia   . Hypopotassemia   . Obesity     Patient Active Problem List   Diagnosis Date Noted  . Asthma   . Hypopotassemia   . Atypical chest pain 10/17/2012  . Obesity 10/17/2012  . GERD (gastroesophageal reflux disease) 10/17/2012  . HTN (hypertension) 10/17/2012  . Hypokalemia 10/17/2012    Past Surgical History:  Procedure Laterality Date  . ADENOIDECTOMY    . HERNIA REPAIR         Home Medications    Prior to Admission medications   Medication Sig Start Date End Date Taking? Authorizing Provider  montelukast (SINGULAIR) 10 MG tablet Take 10 mg by mouth at bedtime.   Yes [provider]  aspirin EC 81 MG tablet Take 81 mg by mouth daily.    [provider]  cetirizine (ZYRTEC) 10 MG tablet Take 10 mg by mouth daily.    [provider]  fish oil-omega-3 fatty acids 1000 MG capsule Take 1 g by mouth daily.    [provider]  hydrochlorothiazide (HYDRODIURIL) 25 MG tablet Take 12.5 mg by mouth daily.    [provider]  pantoprazole (PROTONIX) 40 MG tablet Take 1 tablet (40 mg total) by mouth daily with breakfast.  10/18/12   Rai, Delene Ruffiniipudeep K, MD    Family History Family History  Problem Relation Age of Onset  . Hypertension Mother     Social History Social History   Tobacco Use  . Smoking status: Never Smoker  Substance Use Topics  . Alcohol use: No  . Drug use: No     Allergies   Adhesive [tape]   Review of Systems Review of Systems ROS reviewed and all are negative for acute change except as noted in the HPI.  Physical Exam Updated Vital Signs BP (!) 135/92 (BP Location: Right Arm)   Pulse 82   Temp 98.5 F (36.9 C) (Oral)   Resp 18   Ht 5\' 7"  (1.702 m)   Wt 112 kg (247 lb)   SpO2 97%   BMI 38.69 kg/m   Physical Exam  Constitutional: He is oriented to person, place, and time. He appears well-developed and well-nourished. No distress.  HENT:  Head: Normocephalic and atraumatic.  Right Ear: Tympanic membrane, external ear and ear canal normal.  Left Ear: Tympanic membrane, external ear and ear canal normal.  Nose: Nose normal.  Mouth/Throat: Uvula is midline, oropharynx is clear and moist and mucous membranes are normal. No trismus in the jaw. No oropharyngeal exudate, posterior oropharyngeal erythema or tonsillar abscesses.  Eyes: EOM  are normal. Pupils are equal, round, and reactive to light.  Neck: Normal range of motion. Neck supple. No tracheal deviation present.  Cardiovascular: Normal rate, regular rhythm, S1 normal, S2 normal, normal heart sounds, intact distal pulses and normal pulses.  Pulmonary/Chest: Effort normal and breath sounds normal. No respiratory distress. He has no decreased breath sounds. He has no wheezes. He has no rhonchi. He has no rales.  Abdominal: Normal appearance and bowel sounds are normal. There is no tenderness.  Musculoskeletal: Normal range of motion.  TTP right lower lumbar musculature. No midline tenderness. Ambulates without gait abnormality   Neurological: He is alert and oriented to person, place, and time.  Skin: Skin is warm and  dry.  Psychiatric: He has a normal mood and affect. His speech is normal and behavior is normal. Thought content normal.  Nursing note and vitals reviewed.    ED Treatments / Results  Labs (all labs ordered are listed, but only abnormal results are displayed) Labs Reviewed - No data to display  EKG  EKG Interpretation None       Radiology Dg Lumbar Spine Complete  Result Date: 08/01/2017 CLINICAL DATA:  Larey SeatFell on ice.  Lumbar spine pain. EXAM: LUMBAR SPINE - COMPLETE 4+ VIEW COMPARISON:  None. FINDINGS: There is no evidence of lumbar spine fracture. Alignment is normal. Intervertebral disc spaces are maintained. Lower lumbar facet arthropathy without pars defects. IMPRESSION: Negative. Electronically Signed   By: Elsie StainJohn T Curnes M.D.   On: 08/01/2017 18:53    Procedures Procedures (including critical care time)  Medications Ordered in ED Medications - No data to display   Initial Impression / Assessment and Plan / ED Course  I have reviewed the triage vital signs and the nursing notes.  Pertinent labs & imaging results that were available during my care of the patient were reviewed by me and considered in my medical decision making (see chart for details).  Final Clinical Impressions(s) / ED Diagnoses   {I have reviewed and evaluated the relevant imaging studies.  {I have reviewed the relevant previous healthcare records.  {I obtained HPI from historian.   ED Course:  Assessment: Patient is a 56 y.o. male with a hx of HTN, Asthma, presents to the Emergency Department today due to right lower back pain. This occurred x 12 hours ago at work. Notes slipping on ice and landing on right lower back. No head trauma or LOC. No headache or visual changes. Notes focal pain to right lower back. No numbness/tingling. No saddle anesthesia. No loss of bowel or bladder function. Rates pain 3/10. Describes as tightness. No CP/SOB. No neurological deficits appreciated. Patient is ambulatory. No  warning symptoms of back pain including: fecal incontinence, urinary retention or overflow incontinence, night sweats, waking from sleep with back pain, unexplained fevers or weight loss, h/o cancer, IVDU, recent trauma. No concern for cauda equina, epidural abscess, or other serious cause of back pain. Conservative measures such as rest, ice/heat and pain medicine indicated with PCP follow-up if no improvement with conservative management.   Disposition/Plan:  DC Home Additional Verbal discharge instructions given and discussed with patient.  Pt Instructed to f/u with PCP in the next week for evaluation and treatment of symptoms. Return precautions given Pt acknowledges and agrees with plan  Supervising Physician Arby BarrettePfeiffer, [place], MD  Final diagnoses:  Acute right-sided low back pain without sciatica    ED Discharge Orders    None       Audry PiliMohr, Ethyl Vila, PA-C 08/01/17 2043  Arby Barrette, MD 08/03/17 [phone]

## 2017-08-01 NOTE — Discharge Instructions (Signed)
Please read and follow all provided instructions.  Your diagnoses today include:  1. Acute right-sided low back pain without sciatica     Tests performed today include: Vital signs - see below for your results today  Medications prescribed:   Take any prescribed medications only as directed.  Home care instructions:  Follow any educational materials contained in this packet Please rest, use ice or heat on your back for the next several days Do not lift, push, pull anything more than 10 pounds for the next week  Follow-up instructions: Please follow-up with your primary care provider in the next 1 week for further evaluation of your symptoms.   Return instructions:  SEEK IMMEDIATE MEDICAL ATTENTION IF YOU HAVE: New numbness, tingling, weakness, or problem with the use of your arms or legs Severe back pain not relieved with medications Loss control of your bowels or bladder Increasing pain in any areas of the body (such as chest or abdominal pain) Shortness of breath, dizziness, or fainting.  Worsening nausea (feeling sick to your stomach), vomiting, fever, or sweats Any other emergent concerns regarding your health   Additional Information:  Your vital signs today were: BP (!) 135/92 (BP Location: Right Arm)    Pulse 82    Temp 98.5 F (36.9 C) (Oral)    Resp 18    Ht 5\' 7"  (1.702 m)    Wt 112 kg (247 lb)    SpO2 97%    BMI 38.69 kg/m  If your blood pressure (BP) was elevated above 135/85 this visit, please have this repeated by your doctor within one month. --------------

## 2017-08-01 NOTE — ED Triage Notes (Signed)
Pt c/o fall from standing landing on ice x 12 hrs ago at work ,  C/o right lower back pain

## 2017-09-26 ENCOUNTER — Telehealth: Payer: Self-pay | Admitting: Allergy and Immunology

## 2017-09-26 NOTE — Telephone Encounter (Signed)
Pt called and would like to talk with dr Lucie Leatherkozlow   Has some questions and would like for you to call him tomorrow morning about 8.00 or 8.30 am.  [phone].

## 2017-10-10 NOTE — Telephone Encounter (Signed)
Called patient no answer at number provided no message left.

## 2017-10-10 NOTE — Telephone Encounter (Signed)
Please contact patient and inform him that this message was during my vacation. What is the detail about his issue?

## 2017-10-15 NOTE — Telephone Encounter (Signed)
Called number provided no answer unable to leave message

## 2018-06-04 ENCOUNTER — Encounter: Payer: Self-pay | Admitting: Allergy and Immunology

## 2018-06-04 ENCOUNTER — Ambulatory Visit: Payer: Self-pay | Admitting: Allergy and Immunology

## 2018-06-04 VITALS — BP 142/86 | HR 80 | Temp 98.1°F | Resp 20 | Ht 67.5 in | Wt 247.0 lb

## 2018-06-04 DIAGNOSIS — J3089 Other allergic rhinitis: Secondary | ICD-10-CM

## 2018-06-04 DIAGNOSIS — J4540 Moderate persistent asthma, uncomplicated: Secondary | ICD-10-CM

## 2018-06-04 DIAGNOSIS — K219 Gastro-esophageal reflux disease without esophagitis: Secondary | ICD-10-CM

## 2018-06-04 MED ORDER — ALBUTEROL SULFATE HFA 108 (90 BASE) MCG/ACT IN AERS: INHALATION_SPRAY | RESPIRATORY_TRACT | 2 refills | Status: DC

## 2018-06-04 MED ORDER — BUDESONIDE-FORMOTEROL FUMARATE 160-4.5 MCG/ACT IN AERO: INHALATION_SPRAY | RESPIRATORY_TRACT | 5 refills | Status: DC

## 2018-06-04 NOTE — Patient Instructions (Addendum)
  1.  Continue Symbicort 160 - 2 inhalations 1-2 times per day depending on asthma activity  2.  Continue montelukast 10 mg daily  3.  Continue ranitidine 150 mg 1-2 times per day depending on reflux activity  4.  Use albuterol MDI 2 inhalations every 4-6 hours if needed  5.  Use OTC cetirizine 10mg  1 tablet 1 time per day if needed  6.  Return to clinic in 6 months or earlier if problem  7.  Consider consolidating all caffeine and chocolate consumption to treat reflux

## 2018-06-04 NOTE — Progress Notes (Signed)
Dear Dr. Mack Guise,  Thank you for referring Billey Chang Yamashiro to the [facility] [place] on 06/04/2018.   Below is a summation of this patient's evaluation and recommendations.  Thank you for your referral. I will keep you informed about this patient's response to treatment.   If you have any questions please do not hesitate to contact me.   Sincerely,  Jessica Priest, MD Allergy / Immunology [facility]   ______________________________________________________________________    NEW PATIENT NOTE  Referring Provider: Jyl Heinz, MD Primary Provider: Jyl Heinz, MD Date of office visit: 06/04/2018    Subjective:   Chief Complaint:  Harold Warner (DOB: 1961-04-29) is a 57 y.o. male who presents to the clinic on 06/04/2018 with a chief complaint of Cough (since July 2019 zpack x2 ) .     HPI: Harold Warner presents to this clinic in evaluation of asthma and allergic rhinitis and reflux.  I have not seen him in this clinic since 2016.  Apparently he did quite well while consistently using Symbicort and montelukast and therapy directed against reflux and rarely required the use of systemic steroids or antibiotics for respiratory tract issue and rarely required the use of a short acting bronchodilator.  His requirement for Symbicort was not a daily requirement and there were days when he would not use this medication but still apparently did quite well.  Unfortunately, this summer he had a prolonged event of wheezing and coughing and he visited with Dr. Blanchard Mane at the [facility] and was treated with 2 systemic steroids and 2 antibiotics and restarted his Symbicort and he has done very well since that point in time.  Currently he is using Symbicort 160 - 2 inhalations twice a day and albuterol MDI twice a day.  He does not really know if he has a requirement for albuterol but it is just part of his daily  schedule that he uses this medication.  He is had very little issues with his nose.  He does continue on montelukast.  He does not really use a nasal steroid at this point.  He has used some Nasonex in the past.  Other than this rather prolonged event that occurred this summer he has really done relatively well and feels as though most of his respiratory tract issues are under pretty good control at this point.  He is very careful about exposures and uses a mask at work at the [employer] and uses a mask whenever he mows the grass or if he knows that he will be exposed to particulate matter.  He does have reflux disease which he is currently treated with ranitidine.  He feels as though this is resulting in very good control of his reflux and it only gets out of control when he eats chocolate or spicy food.  He does consume 2 Pepsi's per day.  He did receive the flu vaccine this year.  Past Medical History:  Diagnosis Date  . Acid reflux   . Asthma   . Atypical chest pain   . Hypertension   . Hypokalemia   . Hypopotassemia   . Obesity   . Recurrent upper respiratory infection (URI)     Past Surgical History:  Procedure Laterality Date  . ADENOIDECTOMY    . HERNIA REPAIR      Allergies as of 06/04/2018      Reactions   Adhesive [tape]  Rash      Medication List      aspirin EC 81 MG tablet Take 81 mg by mouth daily.   cetirizine 10 MG tablet Commonly known as:  ZYRTEC Take 10 mg by mouth daily.   hydrochlorothiazide 25 MG tablet Commonly known as:  HYDRODIURIL Take 12.5 mg by mouth daily.   montelukast 10 MG tablet Commonly known as:  SINGULAIR Take 10 mg by mouth at bedtime.   ranitidine 150 MG tablet Commonly known as:  ZANTAC Take 150 mg by mouth 2 (two) times daily.       Review of systems negative except as noted in HPI / PMHx or noted below:  Review of Systems  Constitutional: Negative.   HENT: Negative.   Eyes: Negative.   Respiratory: Negative.     Cardiovascular: Negative.   Gastrointestinal: Negative.   Genitourinary: Negative.   Musculoskeletal: Negative.   Skin: Negative.   Neurological: Negative.   Endo/Heme/Allergies: Negative.   Psychiatric/Behavioral: Negative.     Family History  Problem Relation Age of Onset  . Hypertension Mother   . Allergic rhinitis Neg Hx   . Asthma Neg Hx   . Eczema Neg Hx   . Urticaria Neg Hx   . Angioedema Neg Hx     Social History   Socioeconomic History  . Marital status: Single    Spouse name: Not on file  . Number of children: Not on file  . Years of education: Not on file  . Highest education level: Not on file  Occupational History  . Not on file  Social Needs  . Financial resource strain: Not on file  . Food insecurity:    Worry: Not on file    Inability: Not on file  . Transportation needs:    Medical: Not on file    Non-medical: Not on file  Tobacco Use  . Smoking status: Never Smoker  . Smokeless tobacco: Never Used  Substance and Sexual Activity  . Alcohol use: No  . Drug use: No  . Sexual activity: Not Currently  Lifestyle  . Physical activity:    Days per week: Not on file    Minutes per session: Not on file  . Stress: Not on file  Relationships  . Social connections:    Talks on phone: Not on file    Gets together: Not on file    Attends religious service: Not on file    Active member of club or organization: Not on file    Attends meetings of clubs or organizations: Not on file    Relationship status: Not on file  . Intimate partner violence:    Fear of current or ex partner: Not on file    Emotionally abused: Not on file    Physically abused: Not on file    Forced sexual activity: Not on file  Other Topics Concern  . Not on file  Social History Narrative  . Not on file    Environmental and Social history  Lives in a house with a dry environment, no animals located inside the household, carpet in the bedroom, no plastic on the bed, no  plastic on the pillow, no smokers located inside the household.  Objective:   Vitals:   06/04/18 1334  BP: (!) 142/86  Pulse: 80  Resp: 20  Temp: 98.1 F (36.7 C)   Height: 5' 7.5" (171.5 cm) Weight: 247 lb (112 kg)  Physical Exam  HENT:  Head: Normocephalic.  Right Ear: Tympanic  membrane, external ear and ear canal normal.  Left Ear: Tympanic membrane, external ear and ear canal normal.  Nose: Nose normal. No mucosal edema or rhinorrhea.  Mouth/Throat: Uvula is midline, oropharynx is clear and moist and mucous membranes are normal. No oropharyngeal exudate.  Eyes: Conjunctivae are normal.  Neck: Trachea normal. No tracheal tenderness present. No tracheal deviation present. No thyromegaly present.  Cardiovascular: Normal rate, regular rhythm, S1 normal, S2 normal and normal heart sounds.  No murmur heard. Pulmonary/Chest: Breath sounds normal. No stridor. No respiratory distress. He has no wheezes. He has no rales.  Musculoskeletal: He exhibits no edema.  Lymphadenopathy:       Head (right side): No tonsillar adenopathy present.       Head (left side): No tonsillar adenopathy present.    He has no cervical adenopathy.  Neurological: He is alert.  Skin: No rash noted. He is not diaphoretic. No erythema. Nails show no clubbing.    Diagnostics: Allergy skin tests were not performed.   Spirometry was performed and demonstrated an FEV1 of 3.78 @ 111 % of predicted. FEV1/FVC = 0.86  Results of a CT angio chest obtained 17 October 2012 identified the following:  No filling defects in the pulmonary arteries to suggest pulmonary emboli. Heart is normal size.  Prominence of the mediastinum on chest x-ray likely related to prominent mediastinal fat seen on this CT. No mediastinal, hilar, or axillary adenopathy.  Lungs are clear.  No effusions. Imaging into the upper abdomen shows no acute findings.  Results of a myocardial nuclear medicine study obtained 18 October 2012  identified the following:  1.  No scintigraphic evidence of prior infarction or exercise- induced ischemia.  Note, the patient was able to achieve 101% of maximum, age predicted heart rate. 2.  Normal wall motion.  Ejection fraction - 68%.  Assessment and Plan:    1. Asthma, moderate persistent, well-controlled   2. Perennial allergic rhinitis   3. Gastroesophageal reflux disease, esophagitis presence not specified     1.  Continue Symbicort 160 - 2 inhalations 1-2 times per day depending on asthma activity  2.  Continue montelukast 10 mg daily  3.  Continue ranitidine 150 mg 1-2 times per day depending on reflux activity  4.  Use albuterol MDI 2 inhalations every 4-6 hours if needed  5.  Use OTC cetirizine 10mg  1 tablet 1 time per day if needed  6.  Return to clinic in 6 months or earlier if problem  7.  Consider consolidating all caffeine and chocolate consumption to treat reflux  Overall Harold Warner appears to be doing relatively well while utilizing a combination of anti-inflammatory medications for his airway and therapy directed against reflux.  He appears to have a very good understanding of his disease process and how the medications work.  We will be available to see him in this clinic should he develop a significant problem in the future but otherwise he will return for a scheduled appointment in 6 months or earlier if there is a problem.  Jessica Priest, MD Allergy / Immunology  [facility]

## 2018-06-05 ENCOUNTER — Encounter: Payer: Self-pay | Admitting: Allergy and Immunology

## 2018-06-05 NOTE — Addendum Note (Signed)
Addended by: Mliss Fritz I on: 06/05/2018 07:40 AM   Modules accepted: Orders

## 2018-06-18 ENCOUNTER — Ambulatory Visit: Payer: Self-pay | Admitting: Allergy & Immunology

## 2018-12-03 ENCOUNTER — Ambulatory Visit: Payer: Self-pay | Admitting: Allergy and Immunology

## 2019-07-29 ENCOUNTER — Ambulatory Visit: Payer: Self-pay | Admitting: Allergy and Immunology

## 2019-11-06 ENCOUNTER — Ambulatory Visit: Payer: Self-pay

## 2019-11-06 ENCOUNTER — Ambulatory Visit: Payer: Non-veteran care | Attending: Internal Medicine

## 2019-11-06 DIAGNOSIS — Z23 Encounter for immunization: Secondary | ICD-10-CM

## 2019-11-06 NOTE — Progress Notes (Signed)
   Covid-19 Vaccination Clinic  Name:  Harold Warner    MRN: [id number] DOB: 05-22-1961  11/06/2019  [name] was observed post Covid-19 immunization for 15 minutes without incident. He was provided with Vaccine Information Sheet and instruction to access the V-Safe system.   [name] was instructed to call 911 with any severe reactions post vaccine: Marland Kitchen Difficulty breathing  . Swelling of face and throat  . A fast heartbeat  . A bad rash all over body  . Dizziness and weakness   Immunizations Administered    Name Date Dose VIS Date Route   Pfizer COVID-19 Vaccine 11/06/2019  4:31 PM 0.3 mL 08/01/2019 Intramuscular   Manufacturer: [employer], [employer]   Lot: [id number]   NDC: [address]-7

## 2019-12-02 ENCOUNTER — Ambulatory Visit: Payer: Non-veteran care | Attending: Internal Medicine

## 2019-12-02 DIAGNOSIS — Z23 Encounter for immunization: Secondary | ICD-10-CM

## 2019-12-02 NOTE — Progress Notes (Signed)
   Covid-19 Vaccination Clinic  Name:  Harold Warner    MRN: [id number] DOB: 1961/04/24  12/02/2019  [name] was observed post Covid-19 immunization for 15 minutes without incident. He was provided with Vaccine Information Sheet and instruction to access the V-Safe system.   [name] was instructed to call 911 with any severe reactions post vaccine: Marland Kitchen Difficulty breathing  . Swelling of face and throat  . A fast heartbeat  . A bad rash all over body  . Dizziness and weakness   Immunizations Administered    Name Date Dose VIS Date Route   Pfizer COVID-19 Vaccine 12/02/2019  4:26 PM 0.3 mL 08/01/2019 Intramuscular   Manufacturer: [employer], [employer]   Lot: [id number]   NDC: [address]-3

## 2020-07-31 ENCOUNTER — Ambulatory Visit (HOSPITAL_COMMUNITY)
Admission: EM | Admit: 2020-07-31 | Discharge: 2020-07-31 | Disposition: A | Discharge status: Home / Self Care | Payer: Worker's Compensation | Attending: Physician Assistant | Admitting: Physician Assistant

## 2020-07-31 ENCOUNTER — Other Ambulatory Visit: Payer: Self-pay

## 2020-07-31 ENCOUNTER — Encounter (HOSPITAL_COMMUNITY): Payer: Self-pay

## 2020-07-31 DIAGNOSIS — S76211A Strain of adductor muscle, fascia and tendon of right thigh, initial encounter: Secondary | ICD-10-CM

## 2020-07-31 DIAGNOSIS — S46911A Strain of unspecified muscle, fascia and tendon at shoulder and upper arm level, right arm, initial encounter: Secondary | ICD-10-CM

## 2020-07-31 MED ORDER — NAPROXEN 500 MG PO TABS: 500.0000 mg | ORAL_TABLET | Freq: Two times a day (BID) | ORAL | 0 refills | Status: AC

## 2020-07-31 NOTE — Discharge Instructions (Signed)
Apply ice and heat to shoulder 15 minutes 4 times per day Take medication as prescribed.  Follow up with occupational health.

## 2020-07-31 NOTE — ED Provider Notes (Signed)
[facility]    CSN: [id number] Arrival date & time: 07/31/20  1313      History   Chief Complaint Chief Complaint  Patient presents with   Groin Pain   Shoulder Pain    HPI Harold Warner is a 59 y.o. male.   Patient here c/w R shoulder and R groin pain x 3 days ago . He was at work lifting ~40 lbs tote of magazines (works at [employer]) and felt pull of R shoulder and sharp pain in groin.  States he has had b/l hernia repair with mesh several years ago.    Shoulder - admits pain, tenderness, RROM, weakness, denies swelling, ecchymosis.  Denies prior history of shoulder injury.  Groin - admits pain, tenderness R inguinal canal, denies mass, bulge, testicular pain/swelling.  He has taken OTC pain medication w/o relief.     Past Medical History:  Diagnosis Date   Acid reflux    Asthma    Atypical chest pain    Hypertension    Hypokalemia    Hypopotassemia    Obesity    Recurrent upper respiratory infection (URI)     Patient Active Problem List   Diagnosis Date Noted   Asthma    Hypopotassemia    Atypical chest pain 10/17/2012   Obesity 10/17/2012   GERD (gastroesophageal reflux disease) 10/17/2012   HTN (hypertension) 10/17/2012   Hypokalemia 10/17/2012    Past Surgical History:  Procedure Laterality Date   ADENOIDECTOMY     HERNIA REPAIR         Home Medications    Prior to Admission medications   Medication Sig Start Date End Date Taking? Authorizing Provider  albuterol (PROVENTIL HFA;VENTOLIN HFA) 108 (90 Base) MCG/ACT inhaler Take 2 puffs every 4-6 hours if needed 06/04/18   Kozlow, Alvira Philips, MD  aspirin EC 81 MG tablet Take 81 mg by mouth daily.    [provider]  budesonide-formoterol [facility]) 160-4.5 MCG/ACT inhaler Take 2 puffs 1-2 times daily 06/04/18   Kozlow, Alvira Philips, MD  cetirizine (ZYRTEC) 10 MG tablet Take 10 mg by mouth daily.    [provider]  hydrochlorothiazide (HYDRODIURIL) 25 MG  tablet Take 12.5 mg by mouth daily.    [provider]  montelukast (SINGULAIR) 10 MG tablet Take 10 mg by mouth at bedtime.    [provider]  naproxen (NAPROSYN) 500 MG tablet Take 1 tablet (500 mg total) by mouth 2 (two) times daily for 10 days. 07/31/20 08/10/20  Evern Core, PA-C  ranitidine (ZANTAC) 150 MG tablet Take 150 mg by mouth 2 (two) times daily.    [provider]    Family History Family History  Problem Relation Age of Onset   Hypertension Mother    Allergic rhinitis Neg Hx    Asthma Neg Hx    Eczema Neg Hx    Urticaria Neg Hx    Angioedema Neg Hx     Social History Social History   Tobacco Use   Smoking status: Never Smoker   Smokeless tobacco: Never Used  Substance Use Topics   Alcohol use: No   Drug use: No     Allergies   Adhesive [tape]   Review of Systems Review of Systems  Constitutional: Negative for chills and fever.  HENT: Negative for ear pain and sore throat.   Respiratory: Negative for cough and shortness of breath.   Cardiovascular: Negative for chest pain and palpitations.  Gastrointestinal: Negative for abdominal pain, nausea  and vomiting.  Genitourinary: Negative for dysuria, hematuria, scrotal swelling and testicular pain.  Musculoskeletal: Positive for arthralgias and myalgias. Negative for back pain, gait problem, neck pain and neck stiffness.  Skin: Negative for color change and rash.  Neurological: Positive for weakness. Negative for numbness.  Hematological: Negative for adenopathy. Does not bruise/bleed easily.  Psychiatric/Behavioral: Negative for sleep disturbance.  All other systems reviewed and are negative.    Physical Exam Triage Vital Signs ED Triage Vitals  Enc Vitals Group     BP 07/31/20 1502 (!) 153/98     Pulse Rate 07/31/20 1502 88     Resp 07/31/20 1502 15     Temp 07/31/20 1502 98.1 F (36.7 C)     Temp Source 07/31/20 1502 Oral     SpO2 07/31/20 1502 94 %      Weight --      Height --      Head Circumference --      Peak Flow --      Pain Score 07/31/20 1459 5     Pain Loc --      Pain Edu? --      Excl. in GC? --    No data found.  Updated Vital Signs BP (!) 153/98 (BP Location: Left Wrist)    Pulse 88    Temp 98.1 F (36.7 C) (Oral)    Resp 15    SpO2 94%   Visual Acuity Right Eye Distance:   Left Eye Distance:   Bilateral Distance:    Right Eye Near:   Left Eye Near:    Bilateral Near:     Physical Exam Vitals and nursing note reviewed.  Constitutional:      Appearance: Normal appearance. He is well-developed.  HENT:     Head: Normocephalic and atraumatic.     Nose: Nose normal.     Mouth/Throat:     Mouth: Mucous membranes are moist.  Eyes:     General: No scleral icterus.    Conjunctiva/sclera: Conjunctivae normal.  Pulmonary:     Effort: Pulmonary effort is normal. No respiratory distress.  Abdominal:     Hernia: There is no hernia in the left inguinal area or right inguinal area.  Genitourinary:    Penis: Normal.      Testes:        Right: Mass or tenderness not present.        Left: Mass or tenderness not present.     Epididymis:     Right: Normal.     Left: Normal.  Musculoskeletal:     Right shoulder: Tenderness present. No swelling, deformity, laceration or bony tenderness. Decreased range of motion (abduction reduced to 130 degrees). Decreased strength (4+/5 ).     Cervical back: Normal range of motion and neck supple. No rigidity.     Right hip: No deformity, tenderness or bony tenderness. Normal range of motion. Normal strength.     Comments: Positive empty can and full can test Negative lift off, Yergason, and whipple test  Lymphadenopathy:     Lower Body: No right inguinal adenopathy. No left inguinal adenopathy.  Skin:    General: Skin is warm and dry.     Capillary Refill: Capillary refill takes less than 2 seconds.  Neurological:     General: No focal deficit present.     Mental Status: He is  alert and oriented to person, place, and time.     Gait: Gait normal.  Psychiatric:  Mood and Affect: Mood normal.      UC Treatments / Results  Labs (all labs ordered are listed, but only abnormal results are displayed) Labs Reviewed - No data to display  EKG   Radiology No results found.  Procedures Procedures (including critical care time)  Medications Ordered in UC Medications - No data to display  Initial Impression / Assessment and Plan / UC Course  I have reviewed the triage vital signs and the nursing notes.  Pertinent labs & imaging results that were available during my care of the patient were reviewed by me and considered in my medical decision making (see chart for details).     Follow up with occupational health Final Clinical Impressions(s) / UC Diagnoses   Final diagnoses:  Strain of right shoulder, initial encounter  Inguinal strain, right, initial encounter     Discharge Instructions     Apply ice and heat to shoulder 15 minutes 4 times per day Take medication as prescribed.  Follow up with occupational health.      ED Prescriptions    Medication Sig Dispense Auth. Provider   naproxen (NAPROSYN) 500 MG tablet Take 1 tablet (500 mg total) by mouth 2 (two) times daily for 10 days. 20 tablet Evern Core, PA-C     PDMP not reviewed this encounter.   Evern Core, PA-C 07/31/20 1605

## 2020-07-31 NOTE — ED Triage Notes (Signed)
Pt c/o of a pulling pain in groin and right shoulder X 3 days. Pt denies having pain anywhere else on his body. Pt states he was lifting a tub with magazines in it when he started to feel the pulling pain.

## 2020-08-24 ENCOUNTER — Other Ambulatory Visit: Payer: Self-pay | Admitting: Family Medicine

## 2020-08-24 ENCOUNTER — Ambulatory Visit: Payer: Self-pay

## 2020-08-24 ENCOUNTER — Other Ambulatory Visit: Payer: Self-pay

## 2020-08-24 DIAGNOSIS — M25511 Pain in right shoulder: Secondary | ICD-10-CM

## 2020-08-25 ENCOUNTER — Encounter (HOSPITAL_COMMUNITY): Payer: Self-pay

## 2020-08-27 ENCOUNTER — Ambulatory Visit: Payer: Self-pay

## 2020-09-08 ENCOUNTER — Ambulatory Visit: Payer: Non-veteran care

## 2020-09-14 ENCOUNTER — Encounter: Payer: Self-pay | Admitting: Allergy and Immunology

## 2020-09-14 ENCOUNTER — Ambulatory Visit (INDEPENDENT_AMBULATORY_CARE_PROVIDER_SITE_OTHER): Payer: No Typology Code available for payment source | Admitting: Allergy and Immunology

## 2020-09-14 ENCOUNTER — Other Ambulatory Visit: Payer: Self-pay

## 2020-09-14 VITALS — BP 126/80 | HR 106 | Temp 98.1°F | Resp 16 | Ht 67.0 in | Wt 259.6 lb

## 2020-09-14 DIAGNOSIS — U071 COVID-19: Secondary | ICD-10-CM

## 2020-09-14 DIAGNOSIS — J3089 Other allergic rhinitis: Secondary | ICD-10-CM

## 2020-09-14 DIAGNOSIS — K219 Gastro-esophageal reflux disease without esophagitis: Secondary | ICD-10-CM

## 2020-09-14 DIAGNOSIS — J4540 Moderate persistent asthma, uncomplicated: Secondary | ICD-10-CM

## 2020-09-14 MED ORDER — ALBUTEROL SULFATE HFA 108 (90 BASE) MCG/ACT IN AERS: INHALATION_SPRAY | RESPIRATORY_TRACT | 2 refills | Status: DC

## 2020-09-14 MED ORDER — FAMOTIDINE 40 MG PO TABS: 40.0000 mg | ORAL_TABLET | Freq: Two times a day (BID) | ORAL | 2 refills | Status: DC

## 2020-09-14 MED ORDER — BUDESONIDE-FORMOTEROL FUMARATE 160-4.5 MCG/ACT IN AERO: INHALATION_SPRAY | RESPIRATORY_TRACT | 5 refills | Status: DC

## 2020-09-14 MED ORDER — MONTELUKAST SODIUM 10 MG PO TABS: 10.0000 mg | ORAL_TABLET | Freq: Every day | ORAL | 5 refills | Status: DC

## 2020-09-14 MED ORDER — DM-GUAIFENESIN ER 30-600 MG PO TB12: 2.0000 | ORAL_TABLET | Freq: Two times a day (BID) | ORAL | 1 refills | Status: DC

## 2020-09-14 NOTE — Patient Instructions (Signed)
  1.  Increase famotidine to 40 mg twice a day  2.  Consistently use Symbicort 160-2 inhalations twice a day  3.  Consistently use montelukast 10 mg - 1 tablet once a day  4.  Start prednisone 10 mg - 1 tablet 1 time per day for 10 days  5.  If needed:   A.  Albuterol HFA -2 inhalations every 4-6 hours  B.  Mucinex DM-2 tablets twice a day  6.  Further evaluation?  Go to emergency room if worse.

## 2020-09-14 NOTE — Progress Notes (Signed)
Harold Warner - Harold Warner - Harold Warner - Harold Warner - Harold Warner   Follow-up Note  Referring Provider: Jyl Heinz, MD Primary Provider: Anson Fret, MD Date of Office Visit: 09/14/2020  Subjective:   Harold Warner (DOB: Jul 10, 1961) is a 60 y.o. male [facility] in re-evaluation of the following:  HPI: Harold Warner presents to this clinic in evaluation of a respiratory event that occurred over the course of the past several days.  I have not seen him in this clinic since 04 June 2018.  We see him in his clinic for an issue with asthma and allergic rhinitis and reflux.  Apparently he was doing relatively well but developed some type of respiratory tract infection on 30 August 2020 and received therapy at the [facility] which included systemic steroids and apparently was doing relatively well with that treatment until this past Friday. 5 days ago, he felt really rundown.  This continued through the weekend and then he started coughing yesterday.  He has unrelenting coughing associated with posttussive emesis.  He does not really have that much shortness of breath or chest tightness but it just the coughing that is very disturbing for him.  He does not think that he has had any fever, significant upper airway symptoms, anosmia, ugly nasal discharge, sputum production, or chest pain.  He has received 2 Pfizer Covid vaccines.  Allergies as of 09/14/2020      Reactions   Adhesive [tape] Rash      Medication List      albuterol 108 (90 Base) MCG/ACT inhaler Commonly known as: VENTOLIN HFA Take 2 puffs every 4-6 hours if needed   aspirin EC 81 MG tablet Take 81 mg by mouth daily.   budesonide-formoterol 160-4.5 MCG/ACT inhaler Commonly known as: Symbicort Take 2 puffs 1-2 times daily   cetirizine 10 MG tablet Commonly known as: ZYRTEC Take 10 mg by mouth daily.   famotidine 10 MG tablet Commonly known as: PEPCID Take 10 mg by mouth 2  (two) times daily.   hydrochlorothiazide 25 MG tablet Commonly known as: HYDRODIURIL Take 12.5 mg by mouth daily.   montelukast 10 MG tablet Commonly known as: SINGULAIR Take 10 mg by mouth at bedtime.       Past Medical History:  Diagnosis Date  . Acid reflux   . Asthma   . Atypical chest pain   . Hypertension   . Hypokalemia   . Hypopotassemia   . Obesity   . Recurrent upper respiratory infection (URI)     Past Surgical History:  Procedure Laterality Date  . ADENOIDECTOMY    . HERNIA REPAIR      Review of systems negative except as noted in HPI / PMHx or noted below:  Review of Systems  Constitutional: Negative.   HENT: Negative.   Eyes: Negative.   Respiratory: Negative.   Cardiovascular: Negative.   Gastrointestinal: Negative.   Genitourinary: Negative.   Musculoskeletal: Negative.   Skin: Negative.   Neurological: Negative.   Endo/Heme/Allergies: Negative.   Psychiatric/Behavioral: Negative.      Objective:   Vitals:   09/14/20 1123  BP: 126/80  Pulse: (!) 106  Resp: 16  Temp: 98.1 F (36.7 C)  SpO2: 94%   Height: 5\' 7"  (170.2 cm)  Weight: 259 lb 9.6 oz (117.8 kg)   Physical Exam Constitutional:      Appearance: He is not diaphoretic.  HENT:     Head: Normocephalic.  Right Ear: Tympanic membrane, ear canal and external ear normal.     Left Ear: Tympanic membrane, ear canal and external ear normal.     Nose: Nose normal. No mucosal edema or rhinorrhea.     Mouth/Throat:     Mouth: Oropharynx is clear and moist and mucous membranes are normal.     Pharynx: Uvula midline. No oropharyngeal exudate.  Eyes:     Conjunctiva/sclera: Conjunctivae normal.  Neck:     Thyroid: No thyromegaly.     Trachea: Trachea normal. No tracheal tenderness or tracheal deviation.  Cardiovascular:     Rate and Rhythm: Normal rate and regular rhythm.     Heart sounds: Normal heart sounds, S1 normal and S2 normal. No murmur heard.   Pulmonary:      Effort: No respiratory distress.     Breath sounds: Normal breath sounds. No stridor. No wheezing or rales.  Musculoskeletal:        General: No edema.  Lymphadenopathy:     Head:     Right side of head: No tonsillar adenopathy.     Left side of head: No tonsillar adenopathy.     Cervical: No cervical adenopathy.  Skin:    Findings: No erythema or rash.     Nails: There is no clubbing.  Neurological:     Mental Status: He is alert.     Diagnostics:    Spirometry was not performed  The patient had an Asthma Control Test with the following results: ACT Total Score: 10.    Harold Warner Covid Swab POSITIVE  Assessment and Plan:   1. COVID-19 virus infection   2. Not well controlled moderate persistent asthma   3. Other allergic rhinitis   4. Gastroesophageal reflux disease, unspecified whether esophagitis present     1.  Increase famotidine to 40 mg twice a day  2.  Consistently use Symbicort 160-2 inhalations twice a day  3.  Consistently use montelukast 10 mg - 1 tablet once a day  4.  Start prednisone 10 mg - 1 tablet 1 time per day for 10 days  5.  If needed:   A.  Albuterol HFA -2 inhalations every 4-6 hours  B.  Mucinex DM-2 tablets twice a day  6.  Further evaluation?  Go to emergency room if worse.  Harold Warner is infected with Covid-19.  He is a vaccinated individual and currently he looks clinically stable with a relatively good oxygenation.  We are going to treat him with anti-inflammatory agents as noted above for his airway given the fact that he has posttussive emesis we will increase his famotidine to 40 mg twice a day.  We have given him the phone number for the Covid clinic should he get worse.  We have also given him a pamphlet with Covid testing sites so his mom, who lives with him, can get tested.  Harold Schimke, MD Allergy / Immunology [facility]

## 2020-09-15 ENCOUNTER — Encounter: Payer: Self-pay | Admitting: Allergy and Immunology

## 2020-09-23 ENCOUNTER — Telehealth: Payer: Self-pay

## 2020-09-23 NOTE — Telephone Encounter (Signed)
Patient was notified and verbalized understanding will call back if patients job will require a Covid testing

## 2020-09-23 NOTE — Telephone Encounter (Signed)
Dr Kozlow please advise 

## 2020-09-23 NOTE — Telephone Encounter (Signed)
Patient called to see if he should get COVID Tested again before going back to work tomorrow? Today makes 10 days since he had COVID and his letter from [place] states he can go back to work on tomorrow.   Please Advise.

## 2020-09-23 NOTE — Telephone Encounter (Signed)
Please inform Harold Warner that tomorrow will be 14 days from the onset of his symptoms and that should be okay for him to return to work.  If his work requires him to have a negative Covid swab before returning to work then he is welcome to come and get a Covid swab in our clinic.

## 2020-09-24 ENCOUNTER — Ambulatory Visit (INDEPENDENT_AMBULATORY_CARE_PROVIDER_SITE_OTHER): Payer: No Typology Code available for payment source | Admitting: Allergy

## 2020-09-24 ENCOUNTER — Other Ambulatory Visit: Payer: Self-pay

## 2020-09-24 DIAGNOSIS — U071 COVID-19: Secondary | ICD-10-CM

## 2020-09-24 DIAGNOSIS — Z1152 Encounter for screening for COVID-19: Secondary | ICD-10-CM

## 2020-09-24 NOTE — Patient Instructions (Addendum)
Rapid Covid antigen test is positive.  Test done 09/24/2020 in the office today.   He has been advised that he could remain positive for weeks after initial infection even if asymptomatic.Marland Kitchen  He will need to see what his job requires for when he can return back to work.

## 2020-09-24 NOTE — Progress Notes (Signed)
Patient had Covid in January.  Per patient needs Covid test to be able to return to work on Monday.  He presents to the clinic today for rapid Covid test.

## 2020-09-28 NOTE — Telephone Encounter (Signed)
Pt still has deep cough and headache, he thinks from coughing, no fever and has phlegm in his throat in the morning. Pt is still taking prednisone, he has two 5mg  from the and 10 days worth from Dr. [place]. Pt would like to know if there's anything else he can do or if he has to wait it out.

## 2020-09-28 NOTE — Telephone Encounter (Signed)
Called and spoke to patient and he stated that he is still coughing badly and wants to know what to do to relive his cough. I asked if patient was taking Symbicort 2 inhalations BID and he denied that he was. Patient stated that he was taking liquid mucinex and still doing the prednisone. I informed patient that the prednisone helps open things up so that mucus can exit the body. Patient expressed understanding. Please advise on what can help patient with cough.

## 2020-09-28 NOTE — Telephone Encounter (Signed)
Yes he needs to be taking his Symbicort 2 puffs twice a day as directed by Dr. Lucie Leather as well as the singulair daily.   Use albuterol as needed.   Since he is still symptomatic he may benefit from the post-covid care clinic.

## 2020-09-28 NOTE — Telephone Encounter (Signed)
Called and spoke to patient to inform him to stay on the regimen per Dr. Lucie Leather. Patient agreed.

## 2020-09-30 ENCOUNTER — Telehealth: Payer: Self-pay | Admitting: Allergy

## 2020-09-30 NOTE — Telephone Encounter (Signed)
Patient called and stated that he is still having a persistent raspy cough since post COVID. He asked about the Monoclonal antibodies infusion but I advised that since he tested positive 09/14/20 it may be too late to get the infusion. He is using his albuterol and Symbicort and has finished the prednisone as prescribed by the [place] and is starting the round of Prednisone as prescribed by Dr. Lucie Leather today. He does state that his symptoms are improving he just has the lingering cough. His work is aware and the letter that was given to them has sufficed. Please advise any other recommendations.

## 2020-09-30 NOTE — Telephone Encounter (Signed)
Called patient.  Instructed patient on Dr. Delorse Lek instructions.  Patient was appreciative and stated he would get more mucinex

## 2020-09-30 NOTE — Telephone Encounter (Signed)
Yes would continue supportive care at home.  Teas with lemon and honey may help cough and soothe throat and help with voice changes.  He could also try cough medications like delsym or mucinex dm.

## 2020-10-05 ENCOUNTER — Other Ambulatory Visit: Payer: Self-pay

## 2020-10-05 MED ORDER — HYDROCOD POLST-CPM POLST ER 10-8 MG/5ML PO SUER: 5.0000 mL | Freq: Two times a day (BID) | ORAL | 0 refills | Status: DC | PRN

## 2020-10-05 NOTE — Telephone Encounter (Signed)
Tried calling pt at number listed and the lady who answered hung up on me

## 2020-10-05 NOTE — Telephone Encounter (Signed)
Please see if Dr. Lucie Leather has any recommendations for this pt as he normally sees him.  I provided the test when Dr. Lucie Leather was out of office.   It appears since he is still having issues post-covid that he may benefit from evaluation at the post-covid care clinic.   He has already been on 2 rounds of prednisone at this time.

## 2020-10-05 NOTE — Telephone Encounter (Signed)
Please sign rx that is pended to this encounter thank you

## 2020-10-05 NOTE — Telephone Encounter (Signed)
Please make sure that Harold Warner is performing the following:  1.  famotidine to 40 mg twice a day   2.  Symbicort 160-2 inhalations twice a day   3.  montelukast 10 mg - 1 tablet once a day  We can add Tussionex -2.5-5.0 mL every 12 hours as needed for cough.  Narcotic warning.  60 mL.  No refill  Have him obtain a chest x-ray.

## 2020-10-05 NOTE — Telephone Encounter (Signed)
Pt informed of the tussionex and pt is doing all meds as listed please sign order for tussionex thank you

## 2020-10-05 NOTE — Telephone Encounter (Signed)
Patient called and said that he is still coughing and he is throwing up from coughing. He feels clamming And was tested on 09/24/2020 and still had covid. He is taking all the meds and taking mucinex dm [facility] [place] [phone].

## 2020-10-05 NOTE — Telephone Encounter (Signed)
Please advise 

## 2020-10-08 ENCOUNTER — Telehealth: Payer: Self-pay

## 2020-10-08 NOTE — Telephone Encounter (Signed)
Patient called and stated that he was feeling better and wanted to know if we could do a rapid Covid test because patient states that he is feeling better and wants to go back to work. I advised patient that we do rapid Covid test in office for those who presume to have the symptoms. I informed patient that he could try [facility] or contact his PCP at the [facility] for a rapid test. Patient verbalized understanding and agreed to do so.

## 2020-10-19 ENCOUNTER — Telehealth: Payer: Self-pay

## 2020-10-19 NOTE — Telephone Encounter (Signed)
Patient called stating he received a bill from our office and he didn't understand why because he was supposed to have a [facility] referral.  I spoke with Fredric Mare and she stated she informed him we have never had a [facility] referral for him the day of his visit. She also explained to him that he would be self pay as he doesn't have insurance and he could try contacting the [facility] to see if they could cover the visit.   I informed the patient of this and he agreed he was told that & he would call the [facility] today to see what they  Could do.

## 2020-10-21 NOTE — Telephone Encounter (Signed)
I highly doubt they will back date but let me know if a referral does come in.  Thank you, Candice

## 2021-01-29 ENCOUNTER — Other Ambulatory Visit: Payer: Self-pay | Admitting: Allergy

## 2021-01-29 MED ORDER — PAXLOVID 20 X 150 MG & 10 X 100MG PO TBPK: 3.0000 | ORAL_TABLET | Freq: Two times a day (BID) | ORAL | 0 refills | Status: AC

## 2021-01-29 MED ORDER — PREDNISONE 10 MG PO TABS: ORAL_TABLET | ORAL | 0 refills | Status: AC

## 2021-01-29 NOTE — Progress Notes (Signed)
Message relayed by Dr Dellis Anes who took call.  Pt tested positive for Covid.   eRX for the following sent to pharamcy: Paxlovid (pt reports denies kidney disease) and prednisone 20mg  bid x2 day, 10mg  bid x 2day, 10mg  x 1 day and stop.

## 2021-06-19 ENCOUNTER — Encounter (HOSPITAL_COMMUNITY): Payer: Self-pay | Admitting: Emergency Medicine

## 2021-06-19 ENCOUNTER — Emergency Department (HOSPITAL_COMMUNITY): Payer: No Typology Code available for payment source

## 2021-06-19 ENCOUNTER — Inpatient Hospital Stay (HOSPITAL_COMMUNITY)
Admission: EM | Admit: 2021-06-19 | Discharge: 2021-06-22 | DRG: 246 | Disposition: A | Discharge status: Home / Self Care | Payer: No Typology Code available for payment source | Attending: Internal Medicine | Admitting: Internal Medicine

## 2021-06-19 ENCOUNTER — Other Ambulatory Visit: Payer: Self-pay

## 2021-06-19 ENCOUNTER — Inpatient Hospital Stay (HOSPITAL_COMMUNITY)
Admission: EM | Disposition: A | Discharge status: Home / Self Care | Payer: Self-pay | Source: Home / Self Care | Attending: Internal Medicine

## 2021-06-19 DIAGNOSIS — Z23 Encounter for immunization: Secondary | ICD-10-CM | POA: Diagnosis not present

## 2021-06-19 DIAGNOSIS — K219 Gastro-esophageal reflux disease without esophagitis: Secondary | ICD-10-CM | POA: Diagnosis present

## 2021-06-19 DIAGNOSIS — Z7982 Long term (current) use of aspirin: Secondary | ICD-10-CM | POA: Diagnosis not present

## 2021-06-19 DIAGNOSIS — I2109 ST elevation (STEMI) myocardial infarction involving other coronary artery of anterior wall: Secondary | ICD-10-CM | POA: Diagnosis present

## 2021-06-19 DIAGNOSIS — Z7951 Long term (current) use of inhaled steroids: Secondary | ICD-10-CM

## 2021-06-19 DIAGNOSIS — I2102 ST elevation (STEMI) myocardial infarction involving left anterior descending coronary artery: Secondary | ICD-10-CM | POA: Diagnosis not present

## 2021-06-19 DIAGNOSIS — Z8249 Family history of ischemic heart disease and other diseases of the circulatory system: Secondary | ICD-10-CM

## 2021-06-19 DIAGNOSIS — E876 Hypokalemia: Secondary | ICD-10-CM | POA: Diagnosis present

## 2021-06-19 DIAGNOSIS — Z955 Presence of coronary angioplasty implant and graft: Secondary | ICD-10-CM | POA: Diagnosis not present

## 2021-06-19 DIAGNOSIS — I10 Essential (primary) hypertension: Secondary | ICD-10-CM | POA: Diagnosis not present

## 2021-06-19 DIAGNOSIS — I252 Old myocardial infarction: Secondary | ICD-10-CM

## 2021-06-19 DIAGNOSIS — E785 Hyperlipidemia, unspecified: Secondary | ICD-10-CM

## 2021-06-19 DIAGNOSIS — I110 Hypertensive heart disease with heart failure: Secondary | ICD-10-CM | POA: Diagnosis present

## 2021-06-19 DIAGNOSIS — R159 Full incontinence of feces: Secondary | ICD-10-CM | POA: Diagnosis not present

## 2021-06-19 DIAGNOSIS — I4901 Ventricular fibrillation: Secondary | ICD-10-CM

## 2021-06-19 DIAGNOSIS — I469 Cardiac arrest, cause unspecified: Secondary | ICD-10-CM | POA: Diagnosis not present

## 2021-06-19 DIAGNOSIS — I2510 Atherosclerotic heart disease of native coronary artery without angina pectoris: Secondary | ICD-10-CM

## 2021-06-19 DIAGNOSIS — I213 ST elevation (STEMI) myocardial infarction of unspecified site: Secondary | ICD-10-CM | POA: Diagnosis not present

## 2021-06-19 DIAGNOSIS — R7303 Prediabetes: Secondary | ICD-10-CM | POA: Diagnosis present

## 2021-06-19 DIAGNOSIS — Z79899 Other long term (current) drug therapy: Secondary | ICD-10-CM

## 2021-06-19 DIAGNOSIS — Z20822 Contact with and (suspected) exposure to covid-19: Secondary | ICD-10-CM | POA: Diagnosis present

## 2021-06-19 DIAGNOSIS — I5041 Acute combined systolic (congestive) and diastolic (congestive) heart failure: Secondary | ICD-10-CM | POA: Diagnosis present

## 2021-06-19 DIAGNOSIS — Z6841 Body Mass Index (BMI) 40.0 and over, adult: Secondary | ICD-10-CM | POA: Diagnosis not present

## 2021-06-19 DIAGNOSIS — Z91048 Other nonmedicinal substance allergy status: Secondary | ICD-10-CM

## 2021-06-19 DIAGNOSIS — G4733 Obstructive sleep apnea (adult) (pediatric): Secondary | ICD-10-CM | POA: Diagnosis present

## 2021-06-19 DIAGNOSIS — R079 Chest pain, unspecified: Secondary | ICD-10-CM | POA: Diagnosis not present

## 2021-06-19 DIAGNOSIS — I462 Cardiac arrest due to underlying cardiac condition: Secondary | ICD-10-CM | POA: Diagnosis not present

## 2021-06-19 HISTORY — PX: CORONARY/GRAFT ACUTE MI REVASCULARIZATION: CATH118305

## 2021-06-19 HISTORY — PX: LEFT HEART CATH AND CORONARY ANGIOGRAPHY: CATH118249

## 2021-06-19 LAB — LIPID PANEL
Cholesterol: 192 mg/dL (ref 0–200)
HDL: 42 mg/dL (ref >40)
LDL Cholesterol: 102 mg/dL — ABNORMAL HIGH (ref 0–99)
Total CHOL/HDL Ratio: 4.6 RATIO
Triglycerides: 240 mg/dL — ABNORMAL HIGH (ref <150)
VLDL: 48 mg/dL — ABNORMAL HIGH (ref 0–40)

## 2021-06-19 LAB — CBC WITH DIFFERENTIAL/PLATELET
Abs Immature Granulocytes: 0.2 10*3/uL — ABNORMAL HIGH (ref 0.00–0.07)
Basophils Absolute: 0.2 10*3/uL — ABNORMAL HIGH (ref 0.0–0.1)
Basophils Relative: 1 %
Eosinophils Absolute: 0.5 10*3/uL (ref 0.0–0.5)
Eosinophils Relative: 3 %
HCT: 50.7 % (ref 39.0–52.0)
Hemoglobin: 17.2 g/dL — ABNORMAL HIGH (ref 13.0–17.0)
Lymphocytes Relative: 50 %
Lymphs Abs: 8 10*3/uL — ABNORMAL HIGH (ref 0.7–4.0)
MCH: 30.5 pg (ref 26.0–34.0)
MCHC: 33.9 g/dL (ref 30.0–36.0)
MCV: 89.9 fL (ref 80.0–100.0)
Monocytes Absolute: 1 10*3/uL (ref 0.1–1.0)
Monocytes Relative: 6 %
Myelocytes: 1 %
Neutro Abs: 6.2 10*3/uL (ref 1.7–7.7)
Neutrophils Relative %: 39 %
Platelets: 327 10*3/uL (ref 150–400)
RBC: 5.64 MIL/uL (ref 4.22–5.81)
RDW: 12.2 % (ref 11.5–15.5)
WBC: 15.9 10*3/uL — ABNORMAL HIGH (ref 4.0–10.5)
nRBC: 0 % (ref 0.0–0.2)
nRBC: 0 /100 WBC

## 2021-06-19 LAB — COMPREHENSIVE METABOLIC PANEL
ALT: 34 U/L (ref 0–44)
AST: 46 U/L — ABNORMAL HIGH (ref 15–41)
Albumin: 3.9 g/dL (ref 3.5–5.0)
Alkaline Phosphatase: 90 U/L (ref 38–126)
Anion gap: 16 — ABNORMAL HIGH (ref 5–15)
BUN: 9 mg/dL (ref 8–23)
CO2: 17 mmol/L — ABNORMAL LOW (ref 22–32)
Calcium: 9.1 mg/dL (ref 8.9–10.3)
Chloride: 104 mmol/L (ref 98–111)
Creatinine, Ser: 1.38 mg/dL — ABNORMAL HIGH (ref 0.61–1.24)
GFR, Estimated: 58 mL/min — ABNORMAL LOW (ref >60)
Glucose, Bld: 201 mg/dL — ABNORMAL HIGH (ref 70–99)
Potassium: 4.2 mmol/L (ref 3.5–5.1)
Sodium: 137 mmol/L (ref 135–145)
Total Bilirubin: 1.8 mg/dL — ABNORMAL HIGH (ref 0.3–1.2)
Total Protein: 7 g/dL (ref 6.5–8.1)

## 2021-06-19 LAB — POCT ACTIVATED CLOTTING TIME
Activated Clotting Time: 347 seconds
Activated Clotting Time: 561 seconds

## 2021-06-19 LAB — RESP PANEL BY RT-PCR (FLU A&B, COVID) ARPGX2
Influenza A by PCR: NEGATIVE
Influenza B by PCR: NEGATIVE
SARS Coronavirus 2 by RT PCR: NEGATIVE

## 2021-06-19 LAB — HEMOGLOBIN A1C
Hgb A1c MFr Bld: 6.1 % — ABNORMAL HIGH (ref 4.8–5.6)
Mean Plasma Glucose: 128.37 mg/dL

## 2021-06-19 LAB — PROTIME-INR
INR: 1 (ref 0.8–1.2)
Prothrombin Time: 13.2 seconds (ref 11.4–15.2)

## 2021-06-19 LAB — TROPONIN I (HIGH SENSITIVITY): Troponin I (High Sensitivity): 39 ng/L — ABNORMAL HIGH (ref <18)

## 2021-06-19 LAB — GLUCOSE, CAPILLARY: Glucose-Capillary: 177 mg/dL — ABNORMAL HIGH (ref 70–99)

## 2021-06-19 LAB — APTT: aPTT: 29 seconds (ref 24–36)

## 2021-06-19 SURGERY — CORONARY/GRAFT ACUTE MI REVASCULARIZATION
Anesthesia: LOCAL

## 2021-06-19 MED ORDER — HEPARIN (PORCINE) 25000 UT/250ML-% IV SOLN
1300.0000 [IU]/h | INTRAVENOUS | Status: DC
  Administered 2021-06-19: 1300 [IU]/h via INTRAVENOUS

## 2021-06-19 MED ORDER — SODIUM CHLORIDE 0.9 % IV SOLN: 250.0000 mL | INTRAVENOUS | Status: DC | PRN

## 2021-06-19 MED ORDER — ACETAMINOPHEN 325 MG PO TABS: 650.0000 mg | ORAL_TABLET | ORAL | Status: DC | PRN

## 2021-06-19 MED ORDER — HEPARIN (PORCINE) IN NACL 1000-0.9 UT/500ML-% IV SOLN
INTRAVENOUS | Status: DC | PRN
  Administered 2021-06-19 (×2): 500 mL

## 2021-06-19 MED ORDER — ASPIRIN 81 MG PO CHEW
81.0000 mg | CHEWABLE_TABLET | Freq: Every day | ORAL | Status: DC
  Administered 2021-06-20 – 2021-06-22 (×3): 81 mg via ORAL
  Filled 2021-06-19 (×3): qty 1

## 2021-06-19 MED ORDER — IOHEXOL 350 MG/ML SOLN
INTRAVENOUS | Status: DC | PRN
  Administered 2021-06-19: 115 mL via INTRA_ARTERIAL

## 2021-06-19 MED ORDER — HEPARIN SODIUM (PORCINE) 1000 UNIT/ML IJ SOLN
INTRAMUSCULAR | Status: AC
  Filled 2021-06-19: qty 1

## 2021-06-19 MED ORDER — SODIUM CHLORIDE 0.9 % IV SOLN: INTRAVENOUS | Status: DC

## 2021-06-19 MED ORDER — LABETALOL HCL 5 MG/ML IV SOLN: 10.0000 mg | INTRAVENOUS | Status: AC | PRN

## 2021-06-19 MED ORDER — TICAGRELOR 90 MG PO TABS
90.0000 mg | ORAL_TABLET | Freq: Two times a day (BID) | ORAL | Status: DC
  Administered 2021-06-20 – 2021-06-22 (×5): 90 mg via ORAL
  Filled 2021-06-19 (×5): qty 1

## 2021-06-19 MED ORDER — AMIODARONE HCL IN DEXTROSE 360-4.14 MG/200ML-% IV SOLN
60.0000 mg/h | INTRAVENOUS | Status: DC
  Administered 2021-06-19: 60 mg/h via INTRAVENOUS
  Filled 2021-06-19: qty 200

## 2021-06-19 MED ORDER — MIDAZOLAM HCL 2 MG/2ML IJ SOLN
INTRAMUSCULAR | Status: AC
  Filled 2021-06-19: qty 2

## 2021-06-19 MED ORDER — HEPARIN SODIUM (PORCINE) 5000 UNIT/ML IJ SOLN
4000.0000 [IU] | Freq: Once | INTRAMUSCULAR | Status: AC
  Administered 2021-06-19: 4000 [IU] via INTRAVENOUS

## 2021-06-19 MED ORDER — MIDAZOLAM HCL 2 MG/2ML IJ SOLN
INTRAMUSCULAR | Status: DC | PRN
  Administered 2021-06-19: 1 mg via INTRAVENOUS

## 2021-06-19 MED ORDER — LIDOCAINE HCL (PF) 1 % IJ SOLN
INTRAMUSCULAR | Status: AC
  Filled 2021-06-19: qty 30

## 2021-06-19 MED ORDER — ASPIRIN 81 MG PO CHEW
324.0000 mg | CHEWABLE_TABLET | Freq: Once | ORAL | Status: AC
  Administered 2021-06-19: 324 mg via ORAL

## 2021-06-19 MED ORDER — SODIUM CHLORIDE 0.9% FLUSH
3.0000 mL | Freq: Two times a day (BID) | INTRAVENOUS | Status: DC
  Administered 2021-06-20 – 2021-06-22 (×6): 3 mL via INTRAVENOUS

## 2021-06-19 MED ORDER — VERAPAMIL HCL 2.5 MG/ML IV SOLN
INTRAVENOUS | Status: AC
  Filled 2021-06-19: qty 2

## 2021-06-19 MED ORDER — HYDRALAZINE HCL 20 MG/ML IJ SOLN: 10.0000 mg | INTRAMUSCULAR | Status: AC | PRN

## 2021-06-19 MED ORDER — ATORVASTATIN CALCIUM 80 MG PO TABS
80.0000 mg | ORAL_TABLET | Freq: Every day | ORAL | Status: DC
  Administered 2021-06-20 – 2021-06-22 (×4): 80 mg via ORAL
  Filled 2021-06-19 (×4): qty 1

## 2021-06-19 MED ORDER — TICAGRELOR 90 MG PO TABS
ORAL_TABLET | ORAL | Status: AC
  Filled 2021-06-19: qty 2

## 2021-06-19 MED ORDER — ONDANSETRON HCL 4 MG/2ML IJ SOLN
4.0000 mg | Freq: Four times a day (QID) | INTRAMUSCULAR | Status: DC | PRN
  Administered 2021-06-20: 4 mg via INTRAVENOUS
  Filled 2021-06-19: qty 2

## 2021-06-19 MED ORDER — VERAPAMIL HCL 2.5 MG/ML IV SOLN
INTRAVENOUS | Status: DC | PRN
  Administered 2021-06-19: 10 mL via INTRA_ARTERIAL

## 2021-06-19 MED ORDER — FUROSEMIDE 10 MG/ML IJ SOLN
20.0000 mg | Freq: Every day | INTRAMUSCULAR | Status: DC
  Administered 2021-06-20 – 2021-06-21 (×3): 20 mg via INTRAVENOUS
  Filled 2021-06-19 (×3): qty 2

## 2021-06-19 MED ORDER — HEPARIN SODIUM (PORCINE) 1000 UNIT/ML IJ SOLN
INTRAMUSCULAR | Status: DC | PRN
  Administered 2021-06-19: 5000 [IU] via INTRAVENOUS
  Administered 2021-06-19: 4000 [IU] via INTRAVENOUS

## 2021-06-19 MED ORDER — HEPARIN (PORCINE) IN NACL 1000-0.9 UT/500ML-% IV SOLN
INTRAVENOUS | Status: AC
  Filled 2021-06-19: qty 1000

## 2021-06-19 MED ORDER — TICAGRELOR 90 MG PO TABS
ORAL_TABLET | ORAL | Status: DC | PRN
  Administered 2021-06-19: 180 mg via ORAL

## 2021-06-19 MED ORDER — CHLORHEXIDINE GLUCONATE CLOTH 2 % EX PADS
6.0000 | MEDICATED_PAD | Freq: Every day | CUTANEOUS | Status: DC
  Administered 2021-06-19 – 2021-06-20 (×2): 6 via TOPICAL

## 2021-06-19 MED ORDER — AMIODARONE IV BOLUS ONLY 150 MG/100ML
150.0000 mg | Freq: Once | INTRAVENOUS | Status: AC
  Administered 2021-06-19: 150 mg via INTRAVENOUS

## 2021-06-19 MED ORDER — SODIUM CHLORIDE 0.9% FLUSH: 3.0000 mL | INTRAVENOUS | Status: DC | PRN

## 2021-06-19 MED ORDER — AMIODARONE HCL IN DEXTROSE 360-4.14 MG/200ML-% IV SOLN
30.0000 mg/h | INTRAVENOUS | Status: DC
  Administered 2021-06-20: 30 mg/h via INTRAVENOUS

## 2021-06-19 MED ORDER — MORPHINE SULFATE (PF) 2 MG/ML IV SOLN
INTRAVENOUS | Status: AC
  Filled 2021-06-19: qty 1

## 2021-06-19 MED ORDER — MORPHINE SULFATE (PF) 2 MG/ML IV SOLN
2.0000 mg | INTRAVENOUS | Status: DC | PRN
Start: 2021-06-19 — End: 2021-06-22
  Administered 2021-06-19: 2 mg via INTRAVENOUS

## 2021-06-19 MED ORDER — FENTANYL CITRATE (PF) 100 MCG/2ML IJ SOLN
INTRAMUSCULAR | Status: AC
  Filled 2021-06-19: qty 2

## 2021-06-19 MED ORDER — FENTANYL CITRATE (PF) 100 MCG/2ML IJ SOLN
INTRAMUSCULAR | Status: DC | PRN
  Administered 2021-06-19: 25 ug via INTRAVENOUS

## 2021-06-19 SURGICAL SUPPLY — 21 items
BALLN SAPPHIRE 2.5X12 (BALLOONS) ×2
BALLN SAPPHIRE 2.5X20 (BALLOONS) ×2
BALLN SAPPHIRE ~~LOC~~ 2.75X18 (BALLOONS) ×1 IMPLANT
BALLOON SAPPHIRE 2.5X12 (BALLOONS) IMPLANT
BALLOON SAPPHIRE 2.5X20 (BALLOONS) IMPLANT
CATH DIAG 6FR JR4 (CATHETERS) ×1 IMPLANT
CATH DIAG 6FR PIGTAIL ANGLED (CATHETERS) ×1 IMPLANT
CATH INFINITI 6F FL3.5 (CATHETERS) ×1 IMPLANT
CATH LAUNCHER 6FR EBU3.5 (CATHETERS) ×1 IMPLANT
DEVICE RAD COMP TR BAND LRG (VASCULAR PRODUCTS) ×1 IMPLANT
GLIDESHEATH SLEND SS 6F .021 (SHEATH) ×1 IMPLANT
GUIDEWIRE INQWIRE 1.5J.035X260 (WIRE) IMPLANT
GUIDEWIRE VAS SION BLUE 190 (WIRE) ×1 IMPLANT
INQWIRE 1.5J .035X260CM (WIRE) ×2
KIT ENCORE 26 ADVANTAGE (KITS) ×1 IMPLANT
KIT HEART LEFT (KITS) ×2 IMPLANT
PACK CARDIAC CATHETERIZATION (CUSTOM PROCEDURE TRAY) ×2 IMPLANT
STENT ONYX FRONTIER 2.75X30 (Permanent Stent) ×1 IMPLANT
TRANSDUCER W/STOPCOCK (MISCELLANEOUS) ×2 IMPLANT
TUBING CIL FLEX 10 FLL-RA (TUBING) ×2 IMPLANT
WIRE EMERALD 3MM-J .035X260CM (WIRE) ×1 IMPLANT

## 2021-06-19 NOTE — ED Provider Notes (Signed)
[facility] EMERGENCY DEPARTMENT Provider Note   CSN: [id number] Arrival date & time: 06/19/21  2055     History Chief Complaint  Patient presents with   Chest Pain    Harold Warner is a 60 y.o. male. Level 5 caveat due to cardiac arrest.   Chest Pain Patient came in with chest pain.  Had had chest pain for the last 3 days.  Anterior chest.  EKG showed some changes.  Discussed with cardiology initially and they thought could tolerate a cardiac work-up.  However brought back to the room and went into a V. fib arrest.  1 shock and epi brought him back.  Started on amiodarone bolus.  Woke up.  Was incontinent of stool     Past Medical History:  Diagnosis Date   Acid reflux    Asthma    Atypical chest pain    Hypertension    Hypokalemia    Hypopotassemia    Obesity    Recurrent upper respiratory infection (URI)     Patient Active Problem List   Diagnosis Date Noted   STEMI (ST elevation myocardial infarction) (HCC) 06/19/2021   Asthma    Hypopotassemia    Atypical chest pain 10/17/2012   Obesity 10/17/2012   GERD (gastroesophageal reflux disease) 10/17/2012   HTN (hypertension) 10/17/2012   Hypokalemia 10/17/2012    Past Surgical History:  Procedure Laterality Date   ADENOIDECTOMY     HERNIA REPAIR         Family History  Problem Relation Age of Onset   Hypertension Mother    Allergic rhinitis Neg Hx    Asthma Neg Hx    Eczema Neg Hx    Urticaria Neg Hx    Angioedema Neg Hx     Social History   Tobacco Use   Smoking status: Never   Smokeless tobacco: Never  Substance Use Topics   Alcohol use: No   Drug use: No    Home Medications Prior to Admission medications   Medication Sig Start Date End Date Taking? Authorizing Provider  albuterol (VENTOLIN HFA) 108 (90 Base) MCG/ACT inhaler Take 2 puffs every 4-6 hours if needed 09/14/20   Kozlow, Alvira Philips, MD  aspirin EC 81 MG tablet Take 81 mg by mouth daily. Patient not taking:  Reported on 09/14/2020    [provider]  budesonide-formoterol [facility]) 160-4.5 MCG/ACT inhaler Take 2 puffs 1-2 times daily 09/14/20   Kozlow, Alvira Philips, MD  cetirizine (ZYRTEC) 10 MG tablet Take 10 mg by mouth daily.    [provider]  chlorpheniramine-HYDROcodone (TUSSIONEX PENNKINETIC ER) 10-8 MG/5ML SUER Take 5 mLs by mouth every 12 (twelve) hours as needed for cough. 10/05/20   Kozlow, Alvira Philips, MD  dextromethorphan-guaiFENesin (MUCINEX DM) 30-600 MG 12hr tablet Take 2 tablets by mouth 2 (two) times daily. 09/14/20   Kozlow, Alvira Philips, MD  famotidine (PEPCID) 40 MG tablet Take 1 tablet (40 mg total) by mouth 2 (two) times daily. 09/14/20 10/14/20  Kozlow, Alvira Philips, MD  hydrochlorothiazide (HYDRODIURIL) 25 MG tablet Take 12.5 mg by mouth daily.    [provider]  montelukast (SINGULAIR) 10 MG tablet Take 1 tablet (10 mg total) by mouth at bedtime. 09/14/20   Kozlow, Alvira Philips, MD  ranitidine (ZANTAC) 150 MG tablet Take 150 mg by mouth 2 (two) times daily. Patient not taking: Reported on 09/14/2020    [provider]    Allergies    Adhesive [tape]  Review of Systems  Review of Systems  Unable to perform ROS: Patient unresponsive  Cardiovascular:  Positive for chest pain.   Physical Exam Updated Vital Signs BP 110/68   Pulse 89   Temp 98.2 F (36.8 C) (Oral)   Resp (!) 21   Ht 5\' 7"  (1.702 m)   Wt 135 kg   SpO2 98%   BMI 46.61 kg/m   Physical Exam Vitals and nursing note reviewed.  Constitutional:      Appearance: He is diaphoretic.  Cardiovascular:     Rate and Rhythm: Tachycardia present.  Chest:     Chest wall: No tenderness.  Abdominal:     Tenderness: There is no abdominal tenderness.  Musculoskeletal:     Cervical back: Normal range of motion.     Right lower leg: No edema.     Left lower leg: No edema.  Neurological:     Mental Status: He is alert.     Comments: Patient initially unresponsive.  Improved and was more awake and making  jokes.  Psychiatric:        Mood and Affect: Mood normal.    ED Results / Procedures / Treatments   Labs (all labs ordered are listed, but only abnormal results are displayed) Labs Reviewed  HEMOGLOBIN A1C - Abnormal; Notable for the following components:      Result Value   Hgb A1c MFr Bld 6.1 (*)    All other components within normal limits  CBC WITH DIFFERENTIAL/PLATELET - Abnormal; Notable for the following components:   WBC 15.9 (*)    Hemoglobin 17.2 (*)    Lymphs Abs 8.0 (*)    Basophils Absolute 0.2 (*)    Abs Immature Granulocytes 0.20 (*)    All other components within normal limits  COMPREHENSIVE METABOLIC PANEL - Abnormal; Notable for the following components:   CO2 17 (*)    Glucose, Bld 201 (*)    Creatinine, Ser 1.38 (*)    AST 46 (*)    Total Bilirubin 1.8 (*)    GFR, Estimated 58 (*)    Anion gap 16 (*)    All other components within normal limits  LIPID PANEL - Abnormal; Notable for the following components:   Triglycerides 240 (*)    VLDL 48 (*)    LDL Cholesterol 102 (*)    All other components within normal limits  GLUCOSE, CAPILLARY - Abnormal; Notable for the following components:   Glucose-Capillary 177 (*)    All other components within normal limits  TROPONIN I (HIGH SENSITIVITY) - Abnormal; Notable for the following components:   Troponin I (High Sensitivity) 39 (*)    All other components within normal limits  RESP PANEL BY RT-PCR (FLU A&B, COVID) ARPGX2  MRSA NEXT GEN BY PCR, NASAL  PROTIME-INR  APTT  CBC  POCT ACTIVATED CLOTTING TIME  POCT ACTIVATED CLOTTING TIME  TROPONIN I (HIGH SENSITIVITY)    EKG EKG Interpretation  Date/Time:  Sunday June 19 2021 21:04:07 EDT Ventricular Rate:  90 PR Interval:  164 QRS Duration: 92 QT Interval:  340 QTC Calculation: 415 R Axis:   38 Text Interpretation: Normal sinus rhythm Marked ST abnormality, possible inferior subendocardial injury Abnormal ECG When compared to prior, significant  concern for ischemia. Spoke to cardiology who reviewed this ECG and does not feel this is a STEMI at this time. Confirmed by [id number] (Theda Belfast) on 06/19/2021 9:43:09 PM  Radiology CARDIAC CATHETERIZATION  Result Date: 06/19/2021   Mid LM lesion is 20% stenosed.  Prox LAD lesion is 99% stenosed.   A stent was successfully placed.   Post intervention, there is a 0% residual stenosis.   LV end diastolic pressure is severely elevated. 1.  High-grade proximal LAD lesion treated with 1 drug-eluting stent. 2.  Elevated LVEDP of 27 mmHg. Recommendations: DAPT for 1 year, continue amiodarone until the morning and start beta-blocker and high-dose atorvastatin.  An echocardiogram will be performed in the morning.  Telemetric monitoring for 48 hours.   DG Chest Port 1 View  Result Date: 06/19/2021 CLINICAL DATA:  Chest pain EXAM: PORTABLE CHEST 1 VIEW.  Patient is rotated. COMPARISON:  Chest x-ray 10/17/2012, CT chest 10/17/2012 FINDINGS: The heart and mediastinal contours are unchanged. No focal consolidation. No pulmonary edema. No pleural effusion. No pneumothorax. No acute osseous abnormality. IMPRESSION: No active disease. Electronically Signed   By: Tish Frederickson M.D.   On: 06/19/2021 22:12    Procedures Procedures   Medications Ordered in ED Medications  0.9 %  sodium chloride infusion ( Intravenous New Bag/Given 06/19/21 2301)  amiodarone (NEXTERONE PREMIX) 360-4.14 MG/200ML-% (1.8 mg/mL) IV infusion (60 mg/hr Intravenous Infusion Verify 06/19/21 2300)  amiodarone (NEXTERONE PREMIX) 360-4.14 MG/200ML-% (1.8 mg/mL) IV infusion (has no administration in time range)  Chlorhexidine Gluconate Cloth 2 % PADS 6 each (6 each Topical Given 06/19/21 2320)  aspirin chewable tablet 324 mg (324 mg Oral Given 06/19/21 2144)  heparin injection 4,000 Units (4,000 Units Intravenous Given 06/19/21 2147)  amiodarone (NEXTERONE) IV bolus only 150 mg/100 mL (0 mg Intravenous Stopped 06/19/21 2147)    ED  Course  I have reviewed the triage vital signs and the nursing notes.  Pertinent labs & imaging results that were available during my care of the patient were reviewed by me and considered in my medical decision making (see chart for details).    MDM Rules/Calculators/A&P                           Patient came with chest pain.  Abnormal but nondiagnostic EKG initially.  Then had V. fib arrest.  Defibrillated at 200 J.  Heparin bolus given.  Aspirin given.  Heparin drip started.  Taken emergently to Cath Lab.  Did have mental status back to normal after the arrest.  CRITICAL CARE Performed by: Benjiman Core Total critical care time: 30 minutes Critical care time was exclusive of separately billable procedures and treating other patients. Critical care was necessary to treat or prevent imminent or life-threatening deterioration. Critical care was time spent personally by me on the following activities: development of treatment plan with patient and/or surrogate as well as nursing, discussions with consultants, evaluation of patient's response to treatment, examination of patient, obtaining history from patient or surrogate, ordering and performing treatments and interventions, ordering and review of laboratory studies, ordering and review of radiographic studies, pulse oximetry and re-evaluation of patient's condition.  Final Clinical Impression(s) / ED Diagnoses Final diagnoses:  ST elevation myocardial infarction (STEMI), unspecified artery [facility]  Ventricular fibrillation [facility])    Rx / DC Orders ED Discharge Orders          Ordered    AMB Referral to Cardiac Rehabilitation - Phase II        06/19/21 2306             Benjiman Core, MD 06/19/21 2339

## 2021-06-19 NOTE — ED Notes (Signed)
PT w/ purposeful movement, including tugging at BVM and kicking this RN while inserting IO.

## 2021-06-19 NOTE — ED Notes (Signed)
EMT Crawford continuing high quality CPR after defibrillation

## 2021-06-19 NOTE — ED Provider Notes (Signed)
I walked in the room to find patient unresponsive.  He appeared to have just gone unresponsive and was starting to have tonic type movements.  I called for additional help.  He did not have a pulse and was apneic.  Compressions were immediately started by ed staff at my direction.  He was carefully lowered to the floor protecting his head out of the chair he was in to allow for safe and effective compressions.  He didn't fall.  I began ventilating with a BVM attached to high flow oxygen.  Dr. Rush Landmark to bedside.  I/O was established by Charity fundraiser.  Pads were placed, initial rhythm was V. fib.  He received 1 shock and CPR was immediately resumed.  After this, before a second cycle could be completed, he had spontaneous movements.  He was found to have a pulse at this point.  He reacted to IO placement and flushing, was able to open his eyes and follow commands.  Patient moved to bed on back board.  Patient taken to room 28 for further evaluation.    Cristina Gong, Cordelia Poche 06/19/21 2145    Tegeler, Canary Brim, MD 06/22/21 [phone]

## 2021-06-19 NOTE — ED Provider Notes (Signed)
I was given EKG from triage and I was concerned about the appearance.  When compared to prior, some of the ST changes appear new.  Although initially it did not appear to meet STEMI criteria, I went to assess the patient.  He reports that yesterday he had some chest pressure that improved.  He reports today, the chest pressure waxed and waned but was more severe in his central chest.  He reports it was very exertional and not pleuritic.  It went down his left arm with some tingling.  He reports some shortness of breath, lightheadedness, and diaphoresis.  He has never had this before and reports no history of heart disease.  He denies any family history of heart disease either.  He denies any recent trauma or any fevers, chills, ingestion, or cough.  Denies any abdominal pain or back pain.  Denies any new leg pain or leg swelling.  Denies any other complaints.  I spoke to the STEMI doctor on-call to review the EKG.  After discussion with him, he did not feel that the patient met STEMI criteria either at this time.  He agreed with aspirin, nitro if needed, and consulting the cardiology fellow to see the patient.  If symptoms were to change or escalate, we would call back.  As I was walking back to tell the patient the plan, patient was in cardiac arrest.  Patient reportedly was getting further evaluated in triage when he laid on the ground and went unresponsive.  CPR was started and he had agonal breathing.  Patient was found to be in V. fib arrest and received 1 shock.  IO was in the process of being placed for access.  After defibrillation and more compressions, patient began to wake up and respond.  ROSC was achieved and patient taken to exam room for further management.  Care transferred to Dr. Alvino Chapel for further ED care prior to cardiology management.  I spoke to the STEMI doctor again who agrees with activating code STEMI now.  Subsequent EKG does appear to show STEMI.  Anticipate cardiology  admission after going to the Cath Lab.   Xuan Mateus, Gwenyth Allegra, MD 06/19/21 [phone]

## 2021-06-19 NOTE — ED Notes (Signed)
Pt noted to be in vfib, MD tegeler ordered defibrillation at 200J. 200J delivered via defibrillation. CPR resumed immediately following defibrillation

## 2021-06-19 NOTE — H&P (Signed)
Cardiology Admission History and Physical:   Patient ID: Harold Warner MRN: [id number]; DOB: 22-Oct-1960   Admission date: 06/19/2021  PCP:  Anson Fret, MD   [facility] Providers Cardiologist:  None        Chief Complaint:  Chest pain  Patient Profile:   Harold Warner is a 60 y.o. male with pmh sx for HTN and obesity who is being seen 06/19/2021 for the evaluation of STEMI.   History of Present Illness:   Harold Warner is a 60 y.o. male with pmh sx for HTN and obesity who is being seen 06/19/2021 for the evaluation of STEMI. He came to the ED today because of chest pain. He stated that he had chest pressure yesterday which improved on its own. Then the chest pressure recurred today, and it was more severe in the central pain. He felt SOB as well which was exertional. Pain was radiating to left side; was sweating as well diffusely. In the ED triage, he had diffuse ST depressions and AVR elevation concerning for left main disease. At that time, the patient did not meet STEMI criteria. However subsequently, patient became unresponsive, agonal breathing and was in cardiac arrest. CPR was started- he was in Vfib he was defibrillated with 200 J. After 1 round of compression, patient began to wake up and respond.  ROSC was achieved. He was following commands and his breathing was back to baseline. Subsequent EKG showed ST elevations in V1-V2 concerning for LAD occlusion. STEMI was activated. He was given Heparin bolus, Amio bolus and aspirin load. Then [facility] was taken to the [facility].    Past Medical History:  Diagnosis Date   Acid reflux    Asthma    Atypical chest pain    Hypertension    Hypokalemia    Hypopotassemia    Obesity    Recurrent upper respiratory infection (URI)     Past Surgical History:  Procedure Laterality Date   ADENOIDECTOMY     HERNIA REPAIR       Medications Prior to Admission: Prior to Admission medications   Medication Sig Start Date End Date  Taking? Authorizing Provider  albuterol (VENTOLIN HFA) 108 (90 Base) MCG/ACT inhaler Take 2 puffs every 4-6 hours if needed 09/14/20   Kozlow, Alvira Philips, MD  aspirin EC 81 MG tablet Take 81 mg by mouth daily. Patient not taking: Reported on 09/14/2020    [provider]  budesonide-formoterol [facility]) 160-4.5 MCG/ACT inhaler Take 2 puffs 1-2 times daily 09/14/20   Kozlow, Alvira Philips, MD  cetirizine (ZYRTEC) 10 MG tablet Take 10 mg by mouth daily.    [provider]  chlorpheniramine-HYDROcodone (TUSSIONEX PENNKINETIC ER) 10-8 MG/5ML SUER Take 5 mLs by mouth every 12 (twelve) hours as needed for cough. 10/05/20   Kozlow, Alvira Philips, MD  dextromethorphan-guaiFENesin (MUCINEX DM) 30-600 MG 12hr tablet Take 2 tablets by mouth 2 (two) times daily. 09/14/20   Kozlow, Alvira Philips, MD  famotidine (PEPCID) 40 MG tablet Take 1 tablet (40 mg total) by mouth 2 (two) times daily. 09/14/20 10/14/20  Kozlow, Alvira Philips, MD  hydrochlorothiazide (HYDRODIURIL) 25 MG tablet Take 12.5 mg by mouth daily.    [provider]  montelukast (SINGULAIR) 10 MG tablet Take 1 tablet (10 mg total) by mouth at bedtime. 09/14/20   Kozlow, Alvira Philips, MD  ranitidine (ZANTAC) 150 MG tablet Take 150 mg by mouth 2 (two) times daily. Patient not taking: Reported on 09/14/2020    [provider]  Allergies:    Allergies  Allergen Reactions   Adhesive [Tape] Rash    Social History:   Social History   Socioeconomic History   Marital status: Unknown    Spouse name: Not on file   Number of children: Not on file   Years of education: Not on file   Highest education level: Not on file  Occupational History   Not on file  Tobacco Use   Smoking status: Never   Smokeless tobacco: Never  Substance and Sexual Activity   Alcohol use: No   Drug use: No   Sexual activity: Not Currently  Other Topics Concern   Not on file  Social History Narrative   ** Merged History Encounter **       Social Determinants of Health    Financial Resource Strain: Not on file  Food Insecurity: Not on file  Transportation Needs: Not on file  Physical Activity: Not on file  Stress: Not on file  Social Connections: Not on file  Intimate Partner Violence: Not on file    Family History:  No sudden cardiac deaths.  The patient's family history includes Hypertension in his mother. There is no history of Allergic rhinitis, Asthma, Eczema, Urticaria, or Angioedema.    ROS:  Please see the history of present illness.  All other ROS reviewed and negative.     Physical Exam/Data:   Vitals:   06/19/21 2059 06/19/21 2118 06/19/21 2118  BP: (!) 155/100  (!) 145/93  Pulse: 93  86  Resp: 18  15  Temp: 98.3 F (36.8 C)    TempSrc: Oral    SpO2: 99%  99%  Weight:  135 kg   Height:  5\' 7"  (1.702 m)     Intake/Output Summary (Last 24 hours) at 06/19/2021 2156 Last data filed at 06/19/2021 2147 Gross per 24 hour  Intake 100 ml  Output --  Net 100 ml   Last 3 Weights 06/19/2021 09/14/2020 06/04/2018  Weight (lbs) 297 lb 9.9 oz 259 lb 9.6 oz 247 lb  Weight (kg) 135 kg 117.754 kg 112.038 kg     Body mass index is 46.61 kg/m.  General:  Well nourished, well developed, in moderate distress HEENT: normal Neck: no JVD Vascular: No carotid bruits; Distal pulses 2+ bilaterally   Cardiac:  normal S1, S2; RRR; no murmur  Lungs:  clear to auscultation bilaterally, no wheezing, rhonchi or rales  Abd: soft, nontender, no hepatomegaly  Ext: no edema Musculoskeletal:  No deformities, BUE and BLE strength normal and equal Skin: warm and dry  Neuro:  CNs 2-12 intact, no focal abnormalities noted Psych:  Normal affect    EKG:  The ECG that was done showed ST elevation in V1-V2 and AVR. ST depressions noted diffusely as well.    Laboratory Data:  High Sensitivity Troponin:  No results for input(s): TROPONINIHS in the last 720 hours.    ChemistryNo results for input(s): NA, K, CL, CO2, GLUCOSE, BUN, CREATININE, CALCIUM, MG,  GFRNONAA, GFRAA, ANIONGAP in the last 168 hours.  No results for input(s): PROT, ALBUMIN, AST, ALT, ALKPHOS, BILITOT in the last 168 hours. Lipids No results for input(s): CHOL, TRIG, HDL, LABVLDL, LDLCALC, CHOLHDL in the last 168 hours. HematologyNo results for input(s): WBC, RBC, HGB, HCT, MCV, MCH, MCHC, RDW, PLT in the last 168 hours. Thyroid No results for input(s): TSH, FREET4 in the last 168 hours. BNPNo results for input(s): BNP, PROBNP in the last 168 hours.  DDimer No results for input(s):  DDIMER in the last 168 hours.   Radiology/Studies:  No results found.   Assessment and Plan:   # Anterior STEMI # Cardiac Arrest/Vfib  -Patient presenting with chest pain and concerning EKG changes. Had a subsequent code requiring 1 round of compression/defibrillation -Will do Amio bolus followed by Amio Drip -Heparin bolus followed by heparin drip -Aspirin 325 followed by Aspirin 81 mg daily -Atorvastatin 80 mg -Low dose BB -Echo in AM -Emergent LHC. Suspect Left main. If that's the case, will do IABP and consult CTS. If LAD culprit- will consider PCI.   For questions or updates, please contact [facility] Please consult [email or web address] for contact info under     Signed, Hermelinda Dellen, MD  06/19/2021 9:56 PM

## 2021-06-19 NOTE — ED Notes (Signed)
Paged Stemi 

## 2021-06-19 NOTE — ED Notes (Signed)
Arrived to Rm 6 in response to code blue in triage area. Pt noted to be on the floor pulseless and apneic. CPR initiated immediately by triage EMT Joselyn Glassman. MD Tegeler also present. Pt placed immediately on Zoll.

## 2021-06-19 NOTE — ED Triage Notes (Signed)
Patient reports central chest pain with mild SOB , nausea and diaphoresis onset yesterday , his cardiologist is Dr. Mendel Ryder .

## 2021-06-19 NOTE — Progress Notes (Signed)
ANTICOAGULATION CONSULT NOTE - Initial Consult  Pharmacy Consult for heparin Indication: chest pain/ACS  Allergies  Allergen Reactions   Adhesive [Tape] Rash    Patient Measurements: Height: 5\' 7"  (170.2 cm) Weight: 135 kg (297 lb 9.9 oz) IBW/kg (Calculated) : 66.1 Heparin Dosing Weight: 98.3 kg  Vital Signs: Temp: 98.3 F (36.8 C) (10/30 2059) Temp Source: Oral (10/30 2059) BP: 145/93 (10/30 2118) Pulse Rate: 86 (10/30 2118)  Labs: No results for input(s): HGB, HCT, PLT, APTT, LABPROT, INR, HEPARINUNFRC, HEPRLOWMOCWT, CREATININE, CKTOTAL, CKMB, TROPONINIHS in the last 72 hours.  CrCl cannot be calculated (Patient's most recent lab result is older than the maximum 21 days allowed.).   Medical History: Past Medical History:  Diagnosis Date   Acid reflux    Asthma    Atypical chest pain    Hypertension    Hypokalemia    Hypopotassemia    Obesity    Recurrent upper respiratory infection (URI)     Medications: see MAR  Assessment: 60 yo M - Vfib arrest in triage 2/2 STEMI likely > heparin consult per cards. No baseline CBC - MD told me to dose and give heparin either way.   No AC PTA from my review.   Goal of Therapy:  Heparin level 0.3-0.7 units/ml Monitor platelets by anticoagulation protocol: Yes   Plan:  Give 4000 units bolus x 1 Start heparin infusion at 1300 units/hr Check anti-Xa level in 6 hours and daily while on heparin Continue to monitor H&H and platelets  66, PharmD, [facility] Emergency Medicine Clinical Pharmacist ED RPh Phone: [phone] Main RX: [phone]

## 2021-06-19 NOTE — ED Notes (Addendum)
Responded to triage along with other ED staff for a code blue alarm. Pt was on the floor and compressions were started by EMT. Zoll and pads were placed on patient. Shock was delivered by RN with MD order. Compressions were resumed by this EMT. Pt had approximately 3 minutes of hands on CPR. Pt had pulses and spontaneous movement. Pt was then moved to backboard and lifted by multiple staff members. Pt was placed on ED stretcher and moved to room. All STEMI procedures were followed and pt was placed in radiolucent pads. Repeat EKG was performed in room. Appropriate sites were shaved for cath procedure for precautions. Pt placed in gown and all clothes given to pts mother. Pt was taken to cath lab by Pattricia Boss, RN, Onalee Hua, Rapid Response RN, and myself.

## 2021-06-19 NOTE — ED Notes (Addendum)
PT noted to be unresponsive by triage provider. PT was in a recliner at the time, and was noted to have absent pulse. Myself and other triage staff assisted PT from recliner to floor where I initiated chest compressions. Other ED staff arrived a moment later and Zoll Pads were applied to PT. One round of defibrillation was administered and chest compressions were resumed while I ventilated PT with a BVM. ROSC obtained about a minute later.

## 2021-06-19 NOTE — ED Notes (Signed)
MD Tegeler verbal order for IO placement

## 2021-06-20 ENCOUNTER — Encounter (HOSPITAL_COMMUNITY): Payer: Self-pay | Admitting: Internal Medicine

## 2021-06-20 ENCOUNTER — Other Ambulatory Visit (HOSPITAL_COMMUNITY): Payer: Self-pay

## 2021-06-20 ENCOUNTER — Inpatient Hospital Stay (HOSPITAL_COMMUNITY): Payer: No Typology Code available for payment source

## 2021-06-20 DIAGNOSIS — I5041 Acute combined systolic (congestive) and diastolic (congestive) heart failure: Secondary | ICD-10-CM

## 2021-06-20 DIAGNOSIS — I2102 ST elevation (STEMI) myocardial infarction involving left anterior descending coronary artery: Secondary | ICD-10-CM | POA: Diagnosis not present

## 2021-06-20 DIAGNOSIS — R079 Chest pain, unspecified: Secondary | ICD-10-CM | POA: Diagnosis not present

## 2021-06-20 DIAGNOSIS — I469 Cardiac arrest, cause unspecified: Secondary | ICD-10-CM | POA: Diagnosis not present

## 2021-06-20 DIAGNOSIS — I10 Essential (primary) hypertension: Secondary | ICD-10-CM | POA: Diagnosis not present

## 2021-06-20 DIAGNOSIS — G4733 Obstructive sleep apnea (adult) (pediatric): Secondary | ICD-10-CM

## 2021-06-20 DIAGNOSIS — R7303 Prediabetes: Secondary | ICD-10-CM

## 2021-06-20 LAB — BASIC METABOLIC PANEL
Anion gap: 12 (ref 5–15)
BUN: 8 mg/dL (ref 8–23)
CO2: 23 mmol/L (ref 22–32)
Calcium: 9 mg/dL (ref 8.9–10.3)
Chloride: 103 mmol/L (ref 98–111)
Creatinine, Ser: 1.15 mg/dL (ref 0.61–1.24)
GFR, Estimated: >60 mL/min (ref >60)
Glucose, Bld: 142 mg/dL — ABNORMAL HIGH (ref 70–99)
Potassium: 3.7 mmol/L (ref 3.5–5.1)
Sodium: 138 mmol/L (ref 135–145)

## 2021-06-20 LAB — ECHOCARDIOGRAM COMPLETE
Area-P 1/2: 5.27 cm2
Height: 67 in
Weight: 4761.94 oz

## 2021-06-20 LAB — CBC
HCT: 47.1 % (ref 39.0–52.0)
Hemoglobin: 15.9 g/dL (ref 13.0–17.0)
MCH: 30 pg (ref 26.0–34.0)
MCHC: 33.8 g/dL (ref 30.0–36.0)
MCV: 88.9 fL (ref 80.0–100.0)
Platelets: 296 10*3/uL (ref 150–400)
RBC: 5.3 MIL/uL (ref 4.22–5.81)
RDW: 12.4 % (ref 11.5–15.5)
WBC: 16.8 10*3/uL — ABNORMAL HIGH (ref 4.0–10.5)
nRBC: 0 % (ref 0.0–0.2)

## 2021-06-20 LAB — MRSA NEXT GEN BY PCR, NASAL: MRSA by PCR Next Gen: NOT DETECTED

## 2021-06-20 MED ORDER — ENOXAPARIN SODIUM 60 MG/0.6ML IJ SOSY
60.0000 mg | PREFILLED_SYRINGE | INTRAMUSCULAR | Status: DC
  Administered 2021-06-20 – 2021-06-22 (×3): 60 mg via SUBCUTANEOUS
  Filled 2021-06-20 (×3): qty 0.6

## 2021-06-20 MED ORDER — METOPROLOL TARTRATE 12.5 MG HALF TABLET: 12.5000 mg | ORAL_TABLET | Freq: Two times a day (BID) | ORAL | Status: DC

## 2021-06-20 MED ORDER — METOPROLOL SUCCINATE ER 25 MG PO TB24
12.5000 mg | ORAL_TABLET | Freq: Every day | ORAL | Status: DC
  Administered 2021-06-20 – 2021-06-21 (×2): 12.5 mg via ORAL
  Filled 2021-06-20 (×2): qty 1

## 2021-06-20 MED ORDER — PERFLUTREN LIPID MICROSPHERE
1.0000 mL | INTRAVENOUS | Status: AC | PRN
  Administered 2021-06-20: 2 mL via INTRAVENOUS
  Filled 2021-06-20: qty 10

## 2021-06-20 MED ORDER — POTASSIUM CHLORIDE CRYS ER 20 MEQ PO TBCR
40.0000 meq | EXTENDED_RELEASE_TABLET | Freq: Once | ORAL | Status: AC
  Administered 2021-06-20: 40 meq via ORAL
  Filled 2021-06-20: qty 2

## 2021-06-20 MED ORDER — HEART ATTACK BOUNCING BOOK
Freq: Once | Status: AC
  Filled 2021-06-20: qty 1

## 2021-06-20 MED ORDER — INFLUENZA VAC SPLIT QUAD 0.5 ML IM SUSY: 0.5000 mL | PREFILLED_SYRINGE | INTRAMUSCULAR | Status: DC

## 2021-06-20 MED FILL — Lidocaine HCl Local Preservative Free (PF) Inj 1%: INTRAMUSCULAR | Qty: 30 | Status: AC

## 2021-06-20 NOTE — Progress Notes (Signed)
  Echocardiogram 2D Echocardiogram has been performed.  Janalyn Harder 06/20/2021, 11:24 AM

## 2021-06-20 NOTE — Progress Notes (Signed)
CARDIAC REHAB PHASE I   PRE:  Rate/Rhythm: 73 SR  BP:  Sitting: 103/65      SaO2: 96 RA  MODE:  Ambulation: 270 ft   POST:  Rate/Rhythm: 97 SR  BP:  Sitting: 109/73    SaO2: 96 RA   Pt ambulated 225ft in hallway standby assist with steady gait. Pt denies CP, SOB, or dizziness. Pt assisted to BR than returned to recliner. Pt educated on importance of ASA, Brilinta, statin, and NTG. Pt given MI book along with heart healthy and diabetic diets. Reviewed site care and restrictions. Pt overwhelmed by education. Provided support and encouragement. Will f/u tomorrow. Referred to [facility] [place].  [id number] Reynold Bowen, RN BSN 06/20/2021 2:08 PM

## 2021-06-20 NOTE — Progress Notes (Signed)
   06/19/21 2135  Clinical Encounter Type  Visited With Patient;Family;Patient and family together;Health care provider  Visit Type Initial;Psychological support;Spiritual support;Social support;Critical Care;ED  Referral From Nurse   [facility] responded to CODE STEMI in ED; pt. lying in bed with medical staff attending to him; [facility] located pt.'s mother Velna Hatchet in waiting room and brought her for brief visit w/pt. before he was taken to the cath lab. Brought pt.'s mother to [facility] waiting room and made staff aware of her presence there.  Mother shared that she and pt. live together and that recently there have been some significant stresses in family relationships which she wonders may have contributed to pt.'s sudden illness today.  CH excused himself at length but mother is aware of chaplains' availability if needed.

## 2021-06-20 NOTE — Care Management (Signed)
06-20-21 1554 Case Manager called the [facility] to make them aware that the patient has been hospitalized. Awaiting call back from the [place]. Patient is uninsured at this time. Case Manager will follow for medication needs once stable to transition home.

## 2021-06-20 NOTE — Progress Notes (Signed)
Progress Note  Patient Name: Harold Warner Date of Encounter: 06/20/2021  [facility] Cardiologist: Wayne Both, III,  (2014)  Subjective   2 to 3-day history of chest discomfort.  Eventually came to the emergency room yesterday after becoming very weak while out shopping for groceries.  Wife drove him to the emergency room.  In ER he had VF arrest and was successfully resuscitated with shock and 1 round of epi and CPR.  This morning, he feels well.  Says he has still some burning in his chest but nothing like he had had been.  Has obstructive sleep apnea.  Inpatient Medications    Scheduled Meds:  aspirin  81 mg Oral Daily   atorvastatin  80 mg Oral Daily   Chlorhexidine Gluconate Cloth  6 each Topical Daily   enoxaparin (LOVENOX) injection  60 mg Subcutaneous Q24H   furosemide  20 mg Intravenous Daily   [START ON 06/21/2021] influenza vac split quadrivalent PF  0.5 mL Intramuscular Tomorrow-1000   metoprolol tartrate  12.5 mg Oral BID   sodium chloride flush  3 mL Intravenous Q12H   ticagrelor  90 mg Oral BID   Continuous Infusions:  sodium chloride 20 mL/hr at 06/20/21 0600   sodium chloride     PRN Meds: sodium chloride, acetaminophen, morphine injection, ondansetron (ZOFRAN) IV, sodium chloride flush   Vital Signs    Vitals:   06/20/21 0400 06/20/21 0500 06/20/21 0600 06/20/21 0747  BP: 109/68 107/64 105/69   Pulse: 78 75 76   Resp:      Temp: 98.2 F (36.8 C)   98.8 F (37.1 C)  TempSrc: Oral   Oral  SpO2: 97% 98% 98%   Weight:      Height:        Intake/Output Summary (Last 24 hours) at 06/20/2021 0958 Last data filed at 06/20/2021 0600 Gross per 24 hour  Intake 503.36 ml  Output 750 ml  Net -246.64 ml   Last 3 Weights 06/19/2021 09/14/2020 06/04/2018  Weight (lbs) 297 lb 9.9 oz 259 lb 9.6 oz 247 lb  Weight (kg) 135 kg 117.754 kg 112.038 kg      Telemetry    Normal sinus rhythm.- Personally Reviewed  ECG    Normal sinus rhythm  with nonspecific ST abnormality.  No Q waves are noted.- Personally Reviewed  Physical Exam  Morbid obesity GEN: No acute distress.   Neck: No JVD Cardiac: RRR, no murmurs, rubs, or gallops.  Radial cath site is unremarkable Respiratory: Clear to auscultation bilaterally. GI: Soft, nontender, non-distended  MS: No edema; No deformity. Neuro:  Nonfocal  Psych: Normal affect   Labs    High Sensitivity Troponin:   Recent Labs  Lab 06/19/21 2138 06/20/21 0050  TROPONINIHS 39* 24,053*     Chemistry Recent Labs  Lab 06/19/21 2138 06/20/21 0050  NA 137 138  K 4.2 3.7  CL 104 103  CO2 17* 23  GLUCOSE 201* 142*  BUN 9 8  CREATININE 1.38* 1.15  CALCIUM 9.1 9.0  PROT 7.0  --   ALBUMIN 3.9  --   AST 46*  --   ALT 34  --   ALKPHOS 90  --   BILITOT 1.8*  --   GFRNONAA 58* >60  ANIONGAP 16* 12    Lipids  Recent Labs  Lab 06/19/21 2138  CHOL 192  TRIG 240*  HDL 42  LDLCALC 102*  CHOLHDL 4.6    Hematology Recent Labs  Lab  06/19/21 2138 06/20/21 0050  WBC 15.9* 16.8*  RBC 5.64 5.30  HGB 17.2* 15.9  HCT 50.7 47.1  MCV 89.9 88.9  MCH 30.5 30.0  MCHC 33.9 33.8  RDW 12.2 12.4  PLT 327 296   Thyroid No results for input(s): TSH, FREET4 in the last 168 hours.  BNPNo results for input(s): BNP, PROBNP in the last 168 hours.  DDimer No results for input(s): DDIMER in the last 168 hours.   Radiology    CARDIAC CATHETERIZATION  Result Date: 06/19/2021   Mid LM lesion is 20% stenosed.   Prox LAD lesion is 99% stenosed.   A stent was successfully placed.   Post intervention, there is a 0% residual stenosis.   LV end diastolic pressure is severely elevated. 1.  High-grade proximal LAD lesion treated with 1 drug-eluting stent. 2.  Elevated LVEDP of 27 mmHg. Recommendations: DAPT for 1 year, continue amiodarone until the morning and start beta-blocker and high-dose atorvastatin.  An echocardiogram will be performed in the morning.  Telemetric monitoring for 48 hours.    DG Chest Port 1 View  Result Date: 06/19/2021 CLINICAL DATA:  Chest pain EXAM: PORTABLE CHEST 1 VIEW.  Patient is rotated. COMPARISON:  Chest x-ray 10/17/2012, CT chest 10/17/2012 FINDINGS: The heart and mediastinal contours are unchanged. No focal consolidation. No pulmonary edema. No pleural effusion. No pneumothorax. No acute osseous abnormality. IMPRESSION: No active disease. Electronically Signed   By: Tish Frederickson M.D.   On: 06/19/2021 22:12    Cardiac Studies   Cardiac cath with PCI 06/19/2021: Diagnostic Dominance: Right Intervention   LVSED 27 mmHg   Patient Profile     60 y.o. male HTN , morbid obesity, OSA, anxiety and depression, admitted with anterior STEMI symptomatic for > 18 hours, and VF arrest in ER treated with shock and CPR with ROSC after 10 minutes. .   Assessment & Plan    Acute anterior ST elevation MI complicated by ventricular fibrillation cardiac arrest: Post CPR, emergent mechanical revascularization with proximal LAD stent.  Initial troponin was less than 40 and first troponin post stenting greater than 25,000.  Minimal residual chest discomfort.  Dual antiplatelet therapy, statin therapy, and otherwise aggressive control of comorbid conditions/risk factors. Acute combined systolic and diastolic heart failure: Awaiting echo result.  Beta-blocker, ARB, +/- diuretic therapy. Ventricular fibrillation with successful ROSC after defibrillation, epinephrine, and brief CPR: We will discontinue IV amiodarone.  Start Toprol-XL 12.5 mg/day. Obstructive sleep apnea: Resume CPAP Morbid obesity: Cardiac rehab and aerobic activity.  Works at the [employer].  Job is sedentary. Hyperlipidemia: Aggressive lipid-lowering. Prediabetes mellitus: May consider SGLT2 depending upon EF.       For questions or updates, please contact [facility] Please consult [email or web address] for contact info under        Signed, Lesleigh Noe, MD  06/20/2021, 9:58 AM

## 2021-06-20 NOTE — Discharge Instructions (Addendum)

## 2021-06-20 NOTE — TOC Benefit Eligibility Note (Signed)
Patient Advocate Encounter  Insurance verification completed.    The patient is uninsured  Miracle Criado, CPhT Pharmacy Patient Advocate Specialist [facility] Patient Advocate Team Direct Number: [phone]  Fax: [phone]        

## 2021-06-21 ENCOUNTER — Encounter (HOSPITAL_COMMUNITY): Payer: Self-pay | Admitting: Internal Medicine

## 2021-06-21 DIAGNOSIS — I2102 ST elevation (STEMI) myocardial infarction involving left anterior descending coronary artery: Secondary | ICD-10-CM | POA: Diagnosis not present

## 2021-06-21 DIAGNOSIS — R7303 Prediabetes: Secondary | ICD-10-CM | POA: Diagnosis not present

## 2021-06-21 DIAGNOSIS — G4733 Obstructive sleep apnea (adult) (pediatric): Secondary | ICD-10-CM | POA: Diagnosis not present

## 2021-06-21 LAB — CBC
HCT: 46.5 % (ref 39.0–52.0)
Hemoglobin: 15.6 g/dL (ref 13.0–17.0)
MCH: 30.2 pg (ref 26.0–34.0)
MCHC: 33.5 g/dL (ref 30.0–36.0)
MCV: 89.9 fL (ref 80.0–100.0)
Platelets: 254 10*3/uL (ref 150–400)
RBC: 5.17 MIL/uL (ref 4.22–5.81)
RDW: 12.6 % (ref 11.5–15.5)
WBC: 13.1 10*3/uL — ABNORMAL HIGH (ref 4.0–10.5)
nRBC: 0 % (ref 0.0–0.2)

## 2021-06-21 LAB — BASIC METABOLIC PANEL
Anion gap: 8 (ref 5–15)
BUN: 9 mg/dL (ref 8–23)
CO2: 23 mmol/L (ref 22–32)
Calcium: 8.7 mg/dL — ABNORMAL LOW (ref 8.9–10.3)
Chloride: 105 mmol/L (ref 98–111)
Creatinine, Ser: 1.12 mg/dL (ref 0.61–1.24)
GFR, Estimated: >60 mL/min (ref >60)
Glucose, Bld: 125 mg/dL — ABNORMAL HIGH (ref 70–99)
Potassium: 4 mmol/L (ref 3.5–5.1)
Sodium: 136 mmol/L (ref 135–145)

## 2021-06-21 LAB — TROPONIN I (HIGH SENSITIVITY): Troponin I (High Sensitivity): >24000 ng/L (ref <18)

## 2021-06-21 LAB — MAGNESIUM: Magnesium: 2.1 mg/dL (ref 1.7–2.4)

## 2021-06-21 MED ORDER — METOPROLOL SUCCINATE ER 25 MG PO TB24
25.0000 mg | ORAL_TABLET | Freq: Every day | ORAL | Status: DC
  Administered 2021-06-22: 25 mg via ORAL
  Filled 2021-06-21: qty 1

## 2021-06-21 MED ORDER — LOSARTAN POTASSIUM 25 MG PO TABS
12.5000 mg | ORAL_TABLET | Freq: Every day | ORAL | Status: DC
  Administered 2021-06-21: 12.5 mg via ORAL
  Filled 2021-06-21: qty 1

## 2021-06-21 NOTE — Progress Notes (Signed)
Progress Note  Patient Name: Harold Warner Date of Encounter: 06/21/2021  [facility] Cardiologist: Darci Needle, III, MD  Subjective   The patient feels well this morning.  No chest pain or shortness of breath.  Has already walked without difficulty.  Inpatient Medications    Scheduled Meds:  aspirin  81 mg Oral Daily   atorvastatin  80 mg Oral Daily   Chlorhexidine Gluconate Cloth  6 each Topical Daily   enoxaparin (LOVENOX) injection  60 mg Subcutaneous Q24H   furosemide  20 mg Intravenous Daily   influenza vac split quadrivalent PF  0.5 mL Intramuscular Tomorrow-1000   metoprolol succinate  12.5 mg Oral Daily   sodium chloride flush  3 mL Intravenous Q12H   ticagrelor  90 mg Oral BID   Continuous Infusions:  sodium chloride Stopped (06/20/21 1325)   sodium chloride     PRN Meds: sodium chloride, acetaminophen, morphine injection, ondansetron (ZOFRAN) IV, sodium chloride flush   Vital Signs    Vitals:   06/21/21 0500 06/21/21 0600 06/21/21 0700 06/21/21 0800  BP: 106/70 109/67 106/70 102/67  Pulse: 81 87 (!) 101 81  Resp: (!) 21 (!) 22  14  Temp:    98.2 F (36.8 C)  TempSrc:    Oral  SpO2: 95% 95% 96% 95%  Weight:      Height:        Intake/Output Summary (Last 24 hours) at 06/21/2021 0923 Last data filed at 06/21/2021 0800 Gross per 24 hour  Intake 442.96 ml  Output 2145 ml  Net -1702.04 ml   Last 3 Weights 06/19/2021 09/14/2020 06/04/2018  Weight (lbs) 297 lb 9.9 oz 259 lb 9.6 oz 247 lb  Weight (kg) 135 kg 117.754 kg 112.038 kg      Telemetry   Normal sinus rhythm without any malignant arrhythmias - Personally Reviewed  ECG    Will repeat EKG today.- Personally Reviewed  Physical Exam  Morbid obesity GEN: No acute distress.   Neck: No JVD Cardiac: RRR, no murmurs, rubs, or gallops.  Radial cath site is unremarkable Respiratory: Clear to auscultation bilaterally. GI: Soft, nontender, non-distended  MS: No edema; No  deformity. Neuro:  Nonfocal  Psych: Normal affect   Labs    High Sensitivity Troponin:   Recent Labs  Lab 06/19/21 2138 06/20/21 0050  TROPONINIHS 39* 24,053*     Chemistry Recent Labs  Lab 06/19/21 2138 06/20/21 0050 06/21/21 0128  NA 137 138 136  K 4.2 3.7 4.0  CL 104 103 105  CO2 17* 23 23  GLUCOSE 201* 142* 125*  BUN 9 8 9   CREATININE 1.38* 1.15 1.12  CALCIUM 9.1 9.0 8.7*  MG  --   --  2.1  PROT 7.0  --   --   ALBUMIN 3.9  --   --   AST 46*  --   --   ALT 34  --   --   ALKPHOS 90  --   --   BILITOT 1.8*  --   --   GFRNONAA 58* >60 >60  ANIONGAP 16* 12 8    Lipids  Recent Labs  Lab 06/19/21 2138  CHOL 192  TRIG 240*  HDL 42  LDLCALC 102*  CHOLHDL 4.6    Hematology Recent Labs  Lab 06/19/21 2138 06/20/21 0050 06/21/21 0128  WBC 15.9* 16.8* 13.1*  RBC 5.64 5.30 5.17  HGB 17.2* 15.9 15.6  HCT 50.7 47.1 46.5  MCV 89.9 88.9  89.9  MCH 30.5 30.0 30.2  MCHC 33.9 33.8 33.5  RDW 12.2 12.4 12.6  PLT 327 296 254   Thyroid No results for input(s): TSH, FREET4 in the last 168 hours.  BNPNo results for input(s): BNP, PROBNP in the last 168 hours.  DDimer No results for input(s): DDIMER in the last 168 hours.   Radiology    CARDIAC CATHETERIZATION  Result Date: 06/19/2021   Mid LM lesion is 20% stenosed.   Prox LAD lesion is 99% stenosed.   A stent was successfully placed.   Post intervention, there is a 0% residual stenosis.   LV end diastolic pressure is severely elevated. 1.  High-grade proximal LAD lesion treated with 1 drug-eluting stent. 2.  Elevated LVEDP of 27 mmHg. Recommendations: DAPT for 1 year, continue amiodarone until the morning and start beta-blocker and high-dose atorvastatin.  An echocardiogram will be performed in the morning.  Telemetric monitoring for 48 hours.   DG Chest Port 1 View  Result Date: 06/19/2021 CLINICAL DATA:  Chest pain EXAM: PORTABLE CHEST 1 VIEW.  Patient is rotated. COMPARISON:  Chest x-ray 10/17/2012, CT chest  10/17/2012 FINDINGS: The heart and mediastinal contours are unchanged. No focal consolidation. No pulmonary edema. No pleural effusion. No pneumothorax. No acute osseous abnormality. IMPRESSION: No active disease. Electronically Signed   By: Tish Frederickson M.D.   On: 06/19/2021 22:12   ECHOCARDIOGRAM COMPLETE  Result Date: 06/20/2021    ECHOCARDIOGRAM REPORT   Patient Name:   Harold Warner [facility] Date of Exam: 06/20/2021 Medical Rec #:  [id number]          Height:       67.0 in Accession #:    [id number]         Weight:       297.6 lb Date of Birth:  1961/07/14          BSA:          2.394 m Patient Age:    60 years           BP:           111/74 mmHg Patient Gender: M                  HR:           76 bpm. Exam Location:  Inpatient Procedure: 2D Echo, Cardiac Doppler, Color Doppler and Intracardiac            Opacification Agent Indications:    122-I22.9 Subsequent ST elevation (STEM) and non-ST elevation                 (NSTEMI) myocardial infarction  History:        Patient has no prior history of Echocardiogram examinations.                 CAD; Risk Factors:Hypertension.  Sonographer:    Sheralyn Boatman RDCS Referring Phys: [id number] Charlies Constable [facility]  Sonographer Comments: Technically difficult study due to poor echo windows and patient is morbidly obese. Image acquisition challenging due to patient body habitus. Patient is post stent placement. Extremely difficult windows. Nearly non- diagnostic study. IMPRESSIONS  1. Left ventricular ejection fraction, by estimation, is 55 to 60%. The left ventricle has normal function. Left ventricular endocardial border not optimally defined to evaluate regional wall motion. Left ventricular diastolic function could not be evaluated.  2. Right ventricular systolic function was not well visualized. The right ventricular size is not well visualized.  3.  The mitral valve was not well visualized. No evidence of mitral valve regurgitation. No evidence of mitral stenosis.  4. The  aortic valve was not assessed. Aortic valve regurgitation is not visualized. No aortic stenosis is present.Essentiall non diagnostic study due to poor windows and morbid obesity. u FINDINGS  Left Ventricle: Left ventricular ejection fraction, by estimation, is 55 to 60%. The left ventricle has normal function. Left ventricular endocardial border not optimally defined to evaluate regional wall motion. Definity contrast agent was given IV to delineate the left ventricular endocardial borders. The left ventricular internal cavity size was normal in size. There is no left ventricular hypertrophy. Left ventricular diastolic function could not be evaluated. Right Ventricle: The right ventricular size is not well visualized. Right vetricular wall thickness was not assessed. Right ventricular systolic function was not well visualized. Left Atrium: Left atrial size was not well visualized. Right Atrium: Right atrial size was not well visualized. Pericardium: There is no evidence of pericardial effusion. Mitral Valve: The mitral valve was not well visualized. No evidence of mitral valve regurgitation. No evidence of mitral valve stenosis. Tricuspid Valve: The tricuspid valve is not well visualized. Tricuspid valve regurgitation is not demonstrated. No evidence of tricuspid stenosis. Aortic Valve: The aortic valve was not assessed. Aortic valve regurgitation is not visualized. No aortic stenosis is present. Pulmonic Valve: The pulmonic valve was not well visualized. Pulmonic valve regurgitation is not visualized. No evidence of pulmonic stenosis. Aorta: The aortic root is normal in size and structure. Venous: The inferior vena cava was not well visualized. IAS/Shunts: No atrial level shunt detected by color flow Doppler.   Diastology LV e' medial:    5.55 cm/s LV E/e' medial:  10.3 LV e' lateral:   5.78 cm/s LV E/e' lateral: 9.9  IVC IVC diam: 2.80 cm LEFT ATRIUM           Index LA Vol (A2C): 19.9 ml 8.31 ml/m LA Vol (A4C):  46.8 ml 19.55 ml/m  AORTIC VALVE LVOT Vmax:   88.10 cm/s LVOT Vmean:  55.500 cm/s LVOT VTI:    0.136 m MITRAL VALVE MV Area (PHT): 5.27 cm    SHUNTS MV Decel Time: 144 msec    Systemic VTI: 0.14 m MV E velocity: 57.30 cm/s MV A velocity: 75.00 cm/s MV E/A ratio:  0.76 Armanda Magic MD Electronically signed by Armanda Magic MD Signature Date/Time: 06/20/2021/11:39:24 AM    Final     Cardiac Studies   Cath images reviewed.  Good stent result in proximal LAD.  LVEDP was 27. LVEF by echo 55%.  Study was technically difficult due to patient's obesity.  Patient Profile     60 y.o. male morbid obesity, obstructive sleep apnea, presumed hyperlipidemia, primary hypertension, asthma, and acid reflux who presented with anterior ST segment elevation MI treated with proximal LAD stent.  Assessment & Plan    Anterior ST elevation MI: Successful treatment with proximal LAD stent.  Dual antiplatelet therapy x12 months.  Monotherapy with P2 Y 12 inhibitor thereafter.  Aggressive risk factor modification.  Cardiac rehab. Acute combined systolic and diastolic heart failure.  Surprisingly LVEF is greater than 50%.  Continue beta-blocker therapy.  Depending upon blood pressure, may start low-dose ARB.  Will DC IV Lasix. Hyperlipidemia: Target LDL less than 55.  Continue high intensity statin therapy.  Will need close follow-up.  Dissipate discharge in a.m., 06/22/2021.  For questions or updates, please contact [facility] Please consult [email or web address] for contact info under  Signed, Lesleigh Noe, MD  06/21/2021, 9:23 AM

## 2021-06-21 NOTE — Care Management (Signed)
06-21-21 1233 Case Manager spoke with the [facility] and the patients PCP is Anson Fret at the [facility]. Mallie Darting is the CSW and she can be reached at [phone] ext [phone]. Any new Rx's will need to be sent to the [facility] and escribed to the [facility]. Case Manager will follow for MATCH needs.

## 2021-06-21 NOTE — Progress Notes (Signed)
CARDIAC REHAB PHASE I   Discussed pts EF with him along with answering some questions after yesterdays education. Offered to walk with pt, pt requesting time for rest right now. Will f/u to encourage ambulation as time allows.  [id number] Reynold Bowen, RN BSN 06/21/2021 11:08 AM

## 2021-06-21 NOTE — Progress Notes (Signed)
CARDIAC REHAB PHASE I   PRE:  Rate/Rhythm: 84 SR  BP:  Sitting: 113/82      SaO2: 95 RA  MODE:  Ambulation: 900 ft   POST:  Rate/Rhythm: 108 ST  BP:  Sitting: 120/86    SaO2: 97 RA   Pt ambulated 934ft in hallway independently with steady gait. Pt denies CP, SOB, or dizziness. Reinforced importance of ASA and Brilinta. Reviewed site care, restrictions, and exercise guidelines. Referred to [facility] [place].   [id number] Reynold Bowen, RN BSN 06/21/2021 1:51 PM

## 2021-06-21 NOTE — Plan of Care (Signed)
  Problem: Cardiac: Goal: Ability to achieve and maintain adequate cardiopulmonary perfusion will improve Outcome: Progressing   Problem: Clinical Measurements: Goal: Ability to maintain clinical measurements within normal limits will improve Outcome: Progressing   Problem: Clinical Measurements: Goal: Respiratory complications will improve Outcome: Progressing   Problem: Nutrition: Goal: Adequate nutrition will be maintained Outcome: Progressing

## 2021-06-22 ENCOUNTER — Telehealth: Payer: Self-pay

## 2021-06-22 ENCOUNTER — Other Ambulatory Visit (HOSPITAL_COMMUNITY): Payer: Self-pay

## 2021-06-22 DIAGNOSIS — I4901 Ventricular fibrillation: Secondary | ICD-10-CM

## 2021-06-22 DIAGNOSIS — E785 Hyperlipidemia, unspecified: Secondary | ICD-10-CM

## 2021-06-22 LAB — CBC
HCT: 45 % (ref 39.0–52.0)
Hemoglobin: 15.2 g/dL (ref 13.0–17.0)
MCH: 30 pg (ref 26.0–34.0)
MCHC: 33.8 g/dL (ref 30.0–36.0)
MCV: 88.8 fL (ref 80.0–100.0)
Platelets: 268 10*3/uL (ref 150–400)
RBC: 5.07 MIL/uL (ref 4.22–5.81)
RDW: 12.6 % (ref 11.5–15.5)
WBC: 12.5 10*3/uL — ABNORMAL HIGH (ref 4.0–10.5)
nRBC: 0 % (ref 0.0–0.2)

## 2021-06-22 LAB — BASIC METABOLIC PANEL
Anion gap: 11 (ref 5–15)
BUN: 13 mg/dL (ref 8–23)
CO2: 22 mmol/L (ref 22–32)
Calcium: 8.8 mg/dL — ABNORMAL LOW (ref 8.9–10.3)
Chloride: 103 mmol/L (ref 98–111)
Creatinine, Ser: 1.19 mg/dL (ref 0.61–1.24)
GFR, Estimated: >60 mL/min (ref >60)
Glucose, Bld: 112 mg/dL — ABNORMAL HIGH (ref 70–99)
Potassium: 3.7 mmol/L (ref 3.5–5.1)
Sodium: 136 mmol/L (ref 135–145)

## 2021-06-22 LAB — MAGNESIUM: Magnesium: 2 mg/dL (ref 1.7–2.4)

## 2021-06-22 MED ORDER — TICAGRELOR 90 MG PO TABS
90.0000 mg | ORAL_TABLET | Freq: Two times a day (BID) | ORAL | 0 refills | Status: DC
Start: 2021-06-22 — End: 2021-07-05

## 2021-06-22 MED ORDER — METOPROLOL SUCCINATE ER 25 MG PO TB24: 25.0000 mg | ORAL_TABLET | Freq: Every day | ORAL | 0 refills | Status: DC

## 2021-06-22 MED ORDER — ATORVASTATIN CALCIUM 80 MG PO TABS
80.0000 mg | ORAL_TABLET | Freq: Every day | ORAL | 0 refills | Status: DC
Start: 2021-06-22 — End: 2021-07-05

## 2021-06-22 MED ORDER — LOSARTAN POTASSIUM 25 MG PO TABS
25.0000 mg | ORAL_TABLET | Freq: Every day | ORAL | Status: DC
  Administered 2021-06-22: 25 mg via ORAL
  Filled 2021-06-22: qty 1

## 2021-06-22 MED ORDER — NITROGLYCERIN 0.4 MG SL SUBL
0.4000 mg | SUBLINGUAL_TABLET | SUBLINGUAL | 2 refills | Status: DC | PRN
  Filled 2021-06-22: qty 25, 7d supply, fill #0

## 2021-06-22 MED ORDER — TICAGRELOR 90 MG PO TABS
90.0000 mg | ORAL_TABLET | Freq: Two times a day (BID) | ORAL | 0 refills | Status: DC
  Filled 2021-06-22: qty 60, 30d supply, fill #0

## 2021-06-22 MED ORDER — METOPROLOL SUCCINATE ER 25 MG PO TB24
25.0000 mg | ORAL_TABLET | Freq: Every day | ORAL | 0 refills | Status: DC
  Filled 2021-06-22: qty 14, 14d supply, fill #0

## 2021-06-22 MED ORDER — ATORVASTATIN CALCIUM 80 MG PO TABS
80.0000 mg | ORAL_TABLET | Freq: Every day | ORAL | 0 refills | Status: AC
  Filled 2021-06-22: qty 14, 14d supply, fill #0

## 2021-06-22 NOTE — Progress Notes (Signed)
[id number] Cardiac Rehab Pt states that he has walked in hall, denies any SOB or pain. Completed discharge education with pt. We discussed Brlinita, exercise guidelines, proper use of sl NTG, calling MD, 911 and Outpt. CRP. He voices understanding.Referral to [place] [facility]. [facility].

## 2021-06-22 NOTE — Discharge Summary (Addendum)
The patient has been seen in conjunction with Laverda Page, NP-C. All aspects of care have been considered and discussed. The patient has been personally interviewed, examined, and all clinical data has been reviewed.  Ambulated without difficulty. Tolerating current meds without trouble.   Discharge Summary    Patient ID: Harold Warner MRN: [id number]; DOB: 08/19/1961  Admit date: 06/19/2021 Discharge date: 06/22/2021  PCP:  Anson Fret, MD   [facility] Providers Cardiologist:  Lesleigh Noe, MD     Discharge Diagnoses    Principal Problem:   STEMI (ST elevation myocardial infarction) [facility]) Active Problems:   HTN (hypertension)   Obstructive sleep apnea   Prediabetes   Hyperlipidemia   Ventricular fibrillation [facility])    Diagnostic Studies/Procedures    Cath: 06/19/21  Mid LM lesion is 20% stenosed.   Prox LAD lesion is 99% stenosed.   A stent was successfully placed.   Post intervention, there is a 0% residual stenosis.   LV end diastolic pressure is severely elevated.   1.  High-grade proximal LAD lesion treated with 1 drug-eluting stent. 2.  Elevated LVEDP of 27 mmHg.   Recommendations: DAPT for 1 year, continue amiodarone until the morning and start beta-blocker and high-dose atorvastatin.  An echocardiogram will be performed in the morning.  Telemetric monitoring for 48 hours.   Diagnostic Dominance: Right Intervention    Echo: 06/20/21  IMPRESSIONS     1. Left ventricular ejection fraction, by estimation, is 55 to 60%. The  left ventricle has normal function. Left ventricular endocardial border  not optimally defined to evaluate regional wall motion. Left ventricular  diastolic function could not be  evaluated.   2. Right ventricular systolic function was not well visualized. The right  ventricular size is not well visualized.   3. The mitral valve was not well visualized. No evidence of mitral valve  regurgitation. No  evidence of mitral stenosis.   4. The aortic valve was not assessed. Aortic valve regurgitation is not  visualized. No aortic stenosis is present.Essentiall non diagnostic study  due to poor windows and morbid obesity. u   FINDINGS   Left Ventricle: Left ventricular ejection fraction, by estimation, is 55  to 60%. The left ventricle has normal function. Left ventricular  endocardial border not optimally defined to evaluate regional wall motion.  Definity contrast agent was given IV to  delineate the left ventricular endocardial borders. The left ventricular  internal cavity size was normal in size. There is no left ventricular  hypertrophy. Left ventricular diastolic function could not be evaluated.   Right Ventricle: The right ventricular size is not well visualized. Right  vetricular wall thickness was not assessed. Right ventricular systolic  function was not well visualized.   Left Atrium: Left atrial size was not well visualized.   Right Atrium: Right atrial size was not well visualized.   Pericardium: There is no evidence of pericardial effusion.   Mitral Valve: The mitral valve was not well visualized. No evidence of  mitral valve regurgitation. No evidence of mitral valve stenosis.   Tricuspid Valve: The tricuspid valve is not well visualized. Tricuspid  valve regurgitation is not demonstrated. No evidence of tricuspid  stenosis.   Aortic Valve: The aortic valve was not assessed. Aortic valve  regurgitation is not visualized. No aortic stenosis is present.   Pulmonic Valve: The pulmonic valve was not well visualized. Pulmonic valve  regurgitation is not visualized. No evidence of pulmonic stenosis.   Aorta: The  aortic root is normal in size and structure.   Venous: The inferior vena cava was not well visualized.   IAS/Shunts: No atrial level shunt detected by color flow Doppler.  _____________   History of Present Illness     Harold Warner is a 60 y.o. male  with past medical history of hypertension and obesity who presented on 06/19/2021 for evaluation of STEMI.  Presented to the ED that day with complaints of chest pain.  Stated the pain started the day prior and actually improved.  Pain returned that morning and was more severe in nature.  He also felt short of breath.  Pain radiated into the left side of his chest and he felt diaphoretic.  He presented to the ED and while in triage EKG noted diffuse ST depressions and elevation in aVR concerning for left main disease.  At that time he did not initially meet STEMI criteria.  However subsequently he became unresponsive with agonal breathing and cardiac arrested.  CPR was started.  He was defibrillated in the setting of ventricular fibrillation and received 1 round of CPR before achieving ROSC.  He was alert and able to follow commands and taken to the Cath Lab emergently.  Subsequent EKG showed ST elevation in V1 and V2 concerning for LAD occlusion.  He was given IV heparin, amiodarone and aspirin.  Hospital Course     Anterior STEMI: Underwent cardiac catheterization on 10/30 as noted above.  He was found to have high-grade proximal LAD lesion treated with PCI/DES x1.  Also with 20% mid left main stenosis to be treated medically.  He was placed on DAPT with aspirin/Brilinta for at least 1 year.  LVEDP was noted to be elevated at 27 mmHg.  He was treated with several doses of IV Lasix with significant improvement in his respiratory status.  High-sensitivity troponin peaked at greater than 24,000.  Follow-up echocardiogram showed EF of 55 to 60%, otherwise poor windows unable to evaluate RV, atria or valvular disease.  No recurrent chest pain.  He worked with cardiac rehab and was able to ambulate appropriately. --Continue DAPT with aspirin/Brilinta, metoprolol 25 mg daily, losartan 25 mg daily, atorvastatin 80 mg daily  V.fib arrest: In the setting of acute anterior STEMI.  He was defibrillated while in the ED  with return of ROSC.  He was treated with IV amiodarone.  Rhythm remained stable.   HFpEF: noted to have elevated LVEDP on cath and diuresed with IV lasix. Net - 2L. Euvolemic at discharge.   Hyperlipidemia: LDL 102 --Started on atorvastatin 80 mg daily --Check LFTs/FLP in 8 weeks  Prediabetes: Hemoglobin A1c 6.1 --Diet, lifestyle modification  OSA: Compliant with CPAP  Hypertension: Tolerated the addition of low-dose beta-blocker and ARB  Patient has been seen by Dr. Katrinka Blazing and deemed stable for discharge home. Follow up in the office has been arranged. He will attempt to arrange for cardiology follow up through the [place] as well. Medications sent to the [facility]. Educated by PharmD prior to discharge.   Did the patient have an acute coronary syndrome (MI, NSTEMI, STEMI, etc) this admission?:  Yes                               AHA/ACC Clinical Performance & Quality Measures: Aspirin prescribed? - Yes ADP Receptor Inhibitor (Plavix/Clopidogrel, Brilinta/Ticagrelor or Effient/Prasugrel) prescribed (includes medically managed patients)? - Yes Beta Blocker prescribed? - Yes High Intensity Statin (Lipitor 40-80mg   or Crestor 20-40mg ) prescribed? - Yes EF assessed during THIS hospitalization? - Yes For EF <40%, was ACEI/ARB prescribed? - Not Applicable (EF >/= 40%) For EF <40%, Aldosterone Antagonist (Spironolactone or Eplerenone) prescribed? - Not Applicable (EF >/= 40%) Cardiac Rehab Phase II ordered (including medically managed patients)? - Yes       The patient will be scheduled for a TOC follow up appointment in 10-14 days.  A message has been sent to the [facility] and Scheduling Pool at the office where the patient should be seen for follow up.  _____________  Discharge Vitals Blood pressure 116/78, pulse 87, temperature 98.9 F (37.2 C), temperature source Oral, resp. rate 20, height 5\' 7"  (1.702 m), weight 135 kg, SpO2 97 %.  Filed Weights   06/19/21 2118  Weight: 135 kg     Labs & Radiologic Studies    CBC Recent Labs    06/19/21 2138 06/20/21 0050 06/21/21 0128 06/22/21 0215  WBC 15.9*   < > 13.1* 12.5*  NEUTROABS 6.2  --   --   --   HGB 17.2*   < > 15.6 15.2  HCT 50.7   < > 46.5 45.0  MCV 89.9   < > 89.9 88.8  PLT 327   < > 254 268   < > = values in this interval not displayed.   Basic Metabolic Panel Recent Labs    13/02/22 0128 06/22/21 0215  NA 136 136  K 4.0 3.7  CL 105 103  CO2 23 22  GLUCOSE 125* 112*  BUN 9 13  CREATININE 1.12 1.19  CALCIUM 8.7* 8.8*  MG 2.1 2.0   Liver Function Tests Recent Labs    06/19/21 2138  AST 46*  ALT 34  ALKPHOS 90  BILITOT 1.8*  PROT 7.0  ALBUMIN 3.9   No results for input(s): LIPASE, AMYLASE in the last 72 hours. High Sensitivity Troponin:   Recent Labs  Lab 06/19/21 2138 06/20/21 0050  TROPONINIHS 39* >24,000*    BNP Invalid input(s): POCBNP D-Dimer No results for input(s): DDIMER in the last 72 hours. Hemoglobin A1C Recent Labs    06/19/21 2138  HGBA1C 6.1*   Fasting Lipid Panel Recent Labs    06/19/21 2138  CHOL 192  HDL 42  LDLCALC 102*  TRIG 240*  CHOLHDL 4.6   Thyroid Function Tests No results for input(s): TSH, T4TOTAL, T3FREE, THYROIDAB in the last 72 hours.  Invalid input(s): FREET3 _____________  CARDIAC CATHETERIZATION  Result Date: 06/19/2021   Mid LM lesion is 20% stenosed.   Prox LAD lesion is 99% stenosed.   A stent was successfully placed.   Post intervention, there is a 0% residual stenosis.   LV end diastolic pressure is severely elevated. 1.  High-grade proximal LAD lesion treated with 1 drug-eluting stent. 2.  Elevated LVEDP of 27 mmHg. Recommendations: DAPT for 1 year, continue amiodarone until the morning and start beta-blocker and high-dose atorvastatin.  An echocardiogram will be performed in the morning.  Telemetric monitoring for 48 hours.   DG Chest Port 1 View  Result Date: 06/19/2021 CLINICAL DATA:  Chest pain EXAM: PORTABLE CHEST  1 VIEW.  Patient is rotated. COMPARISON:  Chest x-ray 10/17/2012, CT chest 10/17/2012 FINDINGS: The heart and mediastinal contours are unchanged. No focal consolidation. No pulmonary edema. No pleural effusion. No pneumothorax. No acute osseous abnormality. IMPRESSION: No active disease. Electronically Signed   By: 10/19/2012 M.D.   On: 06/19/2021 22:12   ECHOCARDIOGRAM COMPLETE  Result Date: 06/20/2021    ECHOCARDIOGRAM REPORT   Patient Name:   BEATRIZ JENNISON [facility] Date of Exam: 06/20/2021 Medical Rec #:  [id number]          Height:       67.0 in Accession #:    [id number]         Weight:       297.6 lb Date of Birth:  [birth year]          BSA:          2.394 m Patient Age:    60 years           BP:           111/74 mmHg Patient Gender: M                  HR:           76 bpm. Exam Location:  Inpatient Procedure: 2D Echo, Cardiac Doppler, Color Doppler and Intracardiac            Opacification Agent Indications:    122-I22.9 Subsequent ST elevation (STEM) and non-ST elevation                 (NSTEMI) myocardial infarction  History:        Patient has no prior history of Echocardiogram examinations.                 CAD; Risk Factors:Hypertension.  Sonographer:    Sheralyn Boatman RDCS Referring Phys: [id number] Charlies Constable [facility]  Sonographer Comments: Technically difficult study due to poor echo windows and patient is morbidly obese. Image acquisition challenging due to patient body habitus. Patient is post stent placement. Extremely difficult windows. Nearly non- diagnostic study. IMPRESSIONS  1. Left ventricular ejection fraction, by estimation, is 55 to 60%. The left ventricle has normal function. Left ventricular endocardial border not optimally defined to evaluate regional wall motion. Left ventricular diastolic function could not be evaluated.  2. Right ventricular systolic function was not well visualized. The right ventricular size is not well visualized.  3. The mitral valve was not well visualized. No evidence  of mitral valve regurgitation. No evidence of mitral stenosis.  4. The aortic valve was not assessed. Aortic valve regurgitation is not visualized. No aortic stenosis is present.Essentiall non diagnostic study due to poor windows and morbid obesity. u FINDINGS  Left Ventricle: Left ventricular ejection fraction, by estimation, is 55 to 60%. The left ventricle has normal function. Left ventricular endocardial border not optimally defined to evaluate regional wall motion. Definity contrast agent was given IV to delineate the left ventricular endocardial borders. The left ventricular internal cavity size was normal in size. There is no left ventricular hypertrophy. Left ventricular diastolic function could not be evaluated. Right Ventricle: The right ventricular size is not well visualized. Right vetricular wall thickness was not assessed. Right ventricular systolic function was not well visualized. Left Atrium: Left atrial size was not well visualized. Right Atrium: Right atrial size was not well visualized. Pericardium: There is no evidence of pericardial effusion. Mitral Valve: The mitral valve was not well visualized. No evidence of mitral valve regurgitation. No evidence of mitral valve stenosis. Tricuspid Valve: The tricuspid valve is not well visualized. Tricuspid valve regurgitation is not demonstrated. No evidence of tricuspid stenosis. Aortic Valve: The aortic valve was not assessed. Aortic valve regurgitation is not visualized. No aortic stenosis is present. Pulmonic Valve: The pulmonic valve was not well visualized. Pulmonic valve regurgitation is  not visualized. No evidence of pulmonic stenosis. Aorta: The aortic root is normal in size and structure. Venous: The inferior vena cava was not well visualized. IAS/Shunts: No atrial level shunt detected by color flow Doppler.   Diastology LV e' medial:    5.55 cm/s LV E/e' medial:  10.3 LV e' lateral:   5.78 cm/s LV E/e' lateral: 9.9  IVC IVC diam: 2.80 cm LEFT  ATRIUM           Index LA Vol (A2C): 19.9 ml 8.31 ml/m LA Vol (A4C): 46.8 ml 19.55 ml/m  AORTIC VALVE LVOT Vmax:   88.10 cm/s LVOT Vmean:  55.500 cm/s LVOT VTI:    0.136 m MITRAL VALVE MV Area (PHT): 5.27 cm    SHUNTS MV Decel Time: 144 msec    Systemic VTI: 0.14 m MV E velocity: 57.30 cm/s MV A velocity: 75.00 cm/s MV E/A ratio:  0.76 Armanda Magic MD Electronically signed by Armanda Magic MD Signature Date/Time: 06/20/2021/11:39:24 AM    Final    Disposition   Pt is being discharged home today in good condition.  Follow-up Plans & Appointments     Follow-up Information     Dyann Kief, PA-C Follow up on 07/19/2021.   Specialty: Cardiology Why: at 10:45am for your follow up appt with Dr. Michaelle Copas PA Contact information: [address] [place] [facility] [place] [address] [phone]         Anson Fret, MD Follow up.   Specialty: Family Medicine Why: Please continue to follow with PCP regarding monitoring of you Hgb A1c for prediabetes management Contact information: [address] [facility] [place] [address] [phone]                Discharge Instructions     Amb Referral to Cardiac Rehabilitation   Complete by: As directed    Diagnosis:  Coronary Stents STEMI     After initial evaluation and assessments completed: Virtual Based Care may be provided alone or in conjunction with Phase 2 Cardiac Rehab based on patient barriers.: Yes   Call MD for:  difficulty breathing, headache or visual disturbances   Complete by: As directed    Call MD for:  persistant dizziness or light-headedness   Complete by: As directed    Call MD for:  redness, tenderness, or signs of infection (pain, swelling, redness, odor or green/yellow discharge around incision site)   Complete by: As directed    Diet - low sodium heart healthy   Complete by: As directed    Discharge instructions   Complete by: As directed    Radial Site Care Refer to this sheet in  the next few weeks. These instructions provide you with information on caring for yourself after your procedure. Your caregiver may also give you more specific instructions. Your treatment has been planned according to current medical practices, but problems sometimes occur. Call your caregiver if you have any problems or questions after your procedure. HOME CARE INSTRUCTIONS You may shower the day after the procedure. Remove the bandage (dressing) and gently wash the site with plain soap and water. Gently pat the site dry.  Do not apply powder or lotion to the site.  Do not submerge the affected site in water for 3 to 5 days.  Inspect the site at least twice daily.  Do not flex or bend the affected arm for 24 hours.  No lifting over 5 pounds (2.3 kg) for 5 days after your procedure.  Do not drive  home if you are discharged the same day of the procedure. Have someone else drive you.  You may drive 24 hours after the procedure unless otherwise instructed by your caregiver.  What to expect: Any bruising will usually fade within 1 to 2 weeks.  Blood that collects in the tissue (hematoma) may be painful to the touch. It should usually decrease in size and tenderness within 1 to 2 weeks.  SEEK IMMEDIATE MEDICAL CARE IF: You have unusual pain at the radial site.  You have redness, warmth, swelling, or pain at the radial site.  You have drainage (other than a small amount of blood on the dressing).  You have chills.  You have a fever or persistent symptoms for more than 72 hours.  You have a fever and your symptoms suddenly get worse.  Your arm becomes pale, cool, tingly, or numb.  You have heavy bleeding from the site. Hold pressure on the site.   PLEASE DO NOT MISS ANY DOSES OF YOUR BRILINTA!!!!! Also keep a log of you blood pressures and bring back to your follow up appt. Please call the office with any questions.   Patients taking blood thinners should generally stay away from medicines like  ibuprofen, Advil, Motrin, naproxen, and Aleve due to risk of stomach bleeding. You may take Tylenol as directed or talk to your primary doctor about alternatives.   PLEASE ENSURE THAT YOU DO NOT RUN OUT OF YOUR BRILINTA. This medication is very important to remain on for at least one year. IF you have issues obtaining this medication due to cost please CALL the office 3-5 business days prior to running out in order to prevent missing doses of this medication.   Increase activity slowly   Complete by: As directed        Discharge Medications   Allergies as of 06/22/2021       Reactions   Adhesive [tape] Rash        Medication List     STOP taking these medications    budesonide-formoterol 160-4.5 MCG/ACT inhaler Commonly known as: Symbicort   chlorpheniramine-HYDROcodone 10-8 MG/5ML Suer Commonly known as: Tussionex Pennkinetic ER   dextromethorphan-guaiFENesin 30-600 MG 12hr tablet Commonly known as: MUCINEX DM   hydrochlorothiazide 25 MG tablet Commonly known as: HYDRODIURIL   ranitidine 150 MG tablet Commonly known as: ZANTAC       TAKE these medications    albuterol 108 (90 Base) MCG/ACT inhaler Commonly known as: VENTOLIN HFA Take 2 puffs every 4-6 hours if needed   aspirin EC 81 MG tablet Take 81 mg by mouth daily.   atorvastatin 80 MG tablet Commonly known as: LIPITOR Take 1 tablet (80 mg total) by mouth daily.   busPIRone 10 MG tablet Commonly known as: BUSPAR Take 10 mg by mouth daily.   cetirizine 10 MG tablet Commonly known as: ZYRTEC Take 10 mg by mouth daily.   cholecalciferol 25 MCG (1000 UNIT) tablet Commonly known as: VITAMIN D3 Take 2,000 Units by mouth daily.   famotidine 40 MG tablet Commonly known as: PEPCID Take 1 tablet (40 mg total) by mouth 2 (two) times daily. What changed:  how much to take when to take this   fluticasone-salmeterol 250-50 MCG/ACT Aepb Commonly known as: ADVAIR Inhale 2 puffs into the lungs daily.    losartan 25 MG tablet Commonly known as: COZAAR Take 25 mg by mouth daily.   metoprolol succinate 25 MG 24 hr tablet Commonly known as: TOPROL-XL Take 1 tablet (25  mg total) by mouth daily.   montelukast 10 MG tablet Commonly known as: SINGULAIR Take 1 tablet (10 mg total) by mouth at bedtime.   nitroGLYCERIN 0.4 MG SL tablet Commonly known as: Nitrostat Place 1 tablet (0.4 mg total) under the tongue every 5 (five) minutes as needed.   ticagrelor 90 MG Tabs tablet Commonly known as: BRILINTA Take 1 tablet (90 mg total) by mouth 2 (two) times daily.        Outstanding Labs/Studies   FLP/LFTs in 8 weeks  Duration of Discharge Encounter   Greater than 30 minutes including physician time.  Signed, Laverda Page, NP 06/22/2021, 8:50 AM

## 2021-06-22 NOTE — Telephone Encounter (Signed)
**Note De-identified Muskan Bolla Obfuscation** -----  **Note De-Identified Amarri Michaelson Obfuscation** Message from Arty Baumgartner, NP sent at 06/22/2021  8:47 AM EDT ----- Regarding: TOC call Needs TOC call please!

## 2021-06-22 NOTE — Telephone Encounter (Signed)
**Note De-Identified Harold Warner Obfuscation** The pt is being discharged from [facility] today. We will call him tomorrow.

## 2021-06-22 NOTE — Care Management (Signed)
06-22-21 1022  Case Manager will fax discharge information to the [facility]. Patient states he will take all new Rx's to his provider Anson Fret. No further needs from Case Manager at this time.

## 2021-06-22 NOTE — Plan of Care (Signed)

## 2021-06-22 NOTE — Progress Notes (Addendum)
Progress Note  Patient Name: Harold Warner Date of Encounter: 06/22/2021  [facility] Cardiologist: Darci Needle, III, MD  Subjective   Transferred out of unit.  Has been able to ambulate well.  Significant arrhythmias on monitor.  He is looking forward to discharge today.  Hemodynamically, tolerating beta-blocker and ARB.  Inpatient Medications    Scheduled Meds:  aspirin  81 mg Oral Daily   atorvastatin  80 mg Oral Daily   Chlorhexidine Gluconate Cloth  6 each Topical Daily   enoxaparin (LOVENOX) injection  60 mg Subcutaneous Q24H   influenza vac split quadrivalent PF  0.5 mL Intramuscular Tomorrow-1000   losartan  12.5 mg Oral Daily   metoprolol succinate  25 mg Oral Daily   sodium chloride flush  3 mL Intravenous Q12H   ticagrelor  90 mg Oral BID   Continuous Infusions:  sodium chloride Stopped (06/20/21 1325)   sodium chloride     PRN Meds: sodium chloride, acetaminophen, morphine injection, ondansetron (ZOFRAN) IV, sodium chloride flush   Vital Signs    Vitals:   06/21/21 1239 06/21/21 1647 06/21/21 2004 06/22/21 0606  BP: 113/82 121/74 115/68 116/78  Pulse: 92 91 90 87  Resp: 18 19 20 20   Temp: 98.2 F (36.8 C) 98.3 F (36.8 C) 98.2 F (36.8 C) 98.9 F (37.2 C)  TempSrc: Oral Oral Oral Oral  SpO2: 95% 97% 96% 97%  Weight:      Height:        Intake/Output Summary (Last 24 hours) at 06/22/2021 0752 Last data filed at 06/21/2021 2000 Gross per 24 hour  Intake 880 ml  Output 1075 ml  Net -195 ml   Last 3 Weights 06/19/2021 09/14/2020 06/04/2018  Weight (lbs) 297 lb 9.9 oz 259 lb 9.6 oz 247 lb  Weight (kg) 135 kg 117.754 kg 112.038 kg      Telemetry   Normal sinus rhythm without any malignant arrhythmias - Personally Reviewed  ECG    Will repeat EKG today.- Personally Reviewed  Physical Exam  Morbid obesity GEN: No acute distress.   Neck: No JVD Cardiac: RRR, no murmurs, rubs, or gallops.  Radial cath site is  unremarkable Respiratory: Clear to auscultation bilaterally. GI: Soft, nontender, non-distended  MS: No edema; No deformity. Neuro:  Nonfocal  Psych: Normal affect   Labs    High Sensitivity Troponin:   Recent Labs  Lab 06/19/21 2138 06/20/21 0050  TROPONINIHS 39* >24,000*     Chemistry Recent Labs  Lab 06/19/21 2138 06/20/21 0050 06/21/21 0128 06/22/21 0215  NA 137 138 136 136  K 4.2 3.7 4.0 3.7  CL 104 103 105 103  CO2 17* 23 23 22   GLUCOSE 201* 142* 125* 112*  BUN 9 8 9 13   CREATININE 1.38* 1.15 1.12 1.19  CALCIUM 9.1 9.0 8.7* 8.8*  MG  --   --  2.1 2.0  PROT 7.0  --   --   --   ALBUMIN 3.9  --   --   --   AST 46*  --   --   --   ALT 34  --   --   --   ALKPHOS 90  --   --   --   BILITOT 1.8*  --   --   --   GFRNONAA 58* >60 >60 >60  ANIONGAP 16* 12 8 11     Lipids  Recent Labs  Lab 06/19/21 2138  CHOL 192  TRIG 240*  HDL 42  LDLCALC 102*  CHOLHDL 4.6    Hematology Recent Labs  Lab 06/20/21 0050 06/21/21 0128 06/22/21 0215  WBC 16.8* 13.1* 12.5*  RBC 5.30 5.17 5.07  HGB 15.9 15.6 15.2  HCT 47.1 46.5 45.0  MCV 88.9 89.9 88.8  MCH 30.0 30.2 30.0  MCHC 33.8 33.5 33.8  RDW 12.4 12.6 12.6  PLT 296 254 268   Thyroid No results for input(s): TSH, FREET4 in the last 168 hours.  BNPNo results for input(s): BNP, PROBNP in the last 168 hours.  DDimer No results for input(s): DDIMER in the last 168 hours.   Radiology    ECHOCARDIOGRAM COMPLETE  Result Date: 06/20/2021    ECHOCARDIOGRAM REPORT   Patient Name:   EMANI MORAD [facility] Date of Exam: 06/20/2021 Medical Rec #:  [id number]          Height:       67.0 in Accession #:    [id number]         Weight:       297.6 lb Date of Birth:  1961/02/20          BSA:          2.394 m Patient Age:    60 years           BP:           111/74 mmHg Patient Gender: M                  HR:           76 bpm. Exam Location:  Inpatient Procedure: 2D Echo, Cardiac Doppler, Color Doppler and Intracardiac             Opacification Agent Indications:    122-I22.9 Subsequent ST elevation (STEM) and non-ST elevation                 (NSTEMI) myocardial infarction  History:        Patient has no prior history of Echocardiogram examinations.                 CAD; Risk Factors:Hypertension.  Sonographer:    Sheralyn Boatman RDCS Referring Phys: [id number] Charlies Constable [facility]  Sonographer Comments: Technically difficult study due to poor echo windows and patient is morbidly obese. Image acquisition challenging due to patient body habitus. Patient is post stent placement. Extremely difficult windows. Nearly non- diagnostic study. IMPRESSIONS  1. Left ventricular ejection fraction, by estimation, is 55 to 60%. The left ventricle has normal function. Left ventricular endocardial border not optimally defined to evaluate regional wall motion. Left ventricular diastolic function could not be evaluated.  2. Right ventricular systolic function was not well visualized. The right ventricular size is not well visualized.  3. The mitral valve was not well visualized. No evidence of mitral valve regurgitation. No evidence of mitral stenosis.  4. The aortic valve was not assessed. Aortic valve regurgitation is not visualized. No aortic stenosis is present.Essentiall non diagnostic study due to poor windows and morbid obesity. u FINDINGS  Left Ventricle: Left ventricular ejection fraction, by estimation, is 55 to 60%. The left ventricle has normal function. Left ventricular endocardial border not optimally defined to evaluate regional wall motion. Definity contrast agent was given IV to delineate the left ventricular endocardial borders. The left ventricular internal cavity size was normal in size. There is no left ventricular hypertrophy. Left ventricular diastolic function could not be evaluated. Right Ventricle: The right ventricular size is not well visualized.  Right vetricular wall thickness was not assessed. Right ventricular systolic function was not well  visualized. Left Atrium: Left atrial size was not well visualized. Right Atrium: Right atrial size was not well visualized. Pericardium: There is no evidence of pericardial effusion. Mitral Valve: The mitral valve was not well visualized. No evidence of mitral valve regurgitation. No evidence of mitral valve stenosis. Tricuspid Valve: The tricuspid valve is not well visualized. Tricuspid valve regurgitation is not demonstrated. No evidence of tricuspid stenosis. Aortic Valve: The aortic valve was not assessed. Aortic valve regurgitation is not visualized. No aortic stenosis is present. Pulmonic Valve: The pulmonic valve was not well visualized. Pulmonic valve regurgitation is not visualized. No evidence of pulmonic stenosis. Aorta: The aortic root is normal in size and structure. Venous: The inferior vena cava was not well visualized. IAS/Shunts: No atrial level shunt detected by color flow Doppler.   Diastology LV e' medial:    5.55 cm/s LV E/e' medial:  10.3 LV e' lateral:   5.78 cm/s LV E/e' lateral: 9.9  IVC IVC diam: 2.80 cm LEFT ATRIUM           Index LA Vol (A2C): 19.9 ml 8.31 ml/m LA Vol (A4C): 46.8 ml 19.55 ml/m  AORTIC VALVE LVOT Vmax:   88.10 cm/s LVOT Vmean:  55.500 cm/s LVOT VTI:    0.136 m MITRAL VALVE MV Area (PHT): 5.27 cm    SHUNTS MV Decel Time: 144 msec    Systemic VTI: 0.14 m MV E velocity: 57.30 cm/s MV A velocity: 75.00 cm/s MV E/A ratio:  0.76 Armanda Magic MD Electronically signed by Armanda Magic MD Signature Date/Time: 06/20/2021/11:39:24 AM    Final     Cardiac Studies   Cath images reviewed.  Good stent result in proximal LAD.  LVEDP was 27. LVEF by echo 55%.  Study was technically difficult due to patient's obesity.  Patient Profile     60 y.o. male morbid obesity, obstructive sleep apnea, presumed hyperlipidemia, primary hypertension, asthma, and acid reflux who presented with anterior ST segment elevation MI treated with proximal LAD stent.  Assessment & Plan    Anterior  ST elevation MI: Preserved LV systolic function.  Continue low-dose metoprolol succinate and losartan as tolerated by blood pressure.   Acute combined systolic and diastolic heart failure.  Furosemide has been discontinued.  Continue losartan and metoprolol succinate.   Hyperlipidemia: Target LDL less than 55.  Continue high intensity statin therapy.  Will need close follow-up.   Obstructive sleep apnea: CPAP compliance recommended Primary hypertension: Tolerating beta-blocker and ARB.    Plan discharge today with TOC 7 to 10 days, Dr. Verdis Prime team.  We do need to determine if he will follow-up with [facility] or have all of his care through the [facility] in [place].  Needs phase 2 cardiac rehab.  Stressed compliance with CPAP for obstructive sleep apnea.  For questions or updates, please contact [facility] Please consult [email or web address] for contact info under        Signed, Lesleigh Noe, MD  06/22/2021, 7:52 AM

## 2021-06-23 NOTE — Telephone Encounter (Signed)
**Note De-Identified Harold Warner Obfuscation** Patient contacted regarding discharge from [facility] on 06/22/2021.  Patient understands to follow up with provider Harold Warner. PA-c on 07/19/2021 at 10:45 at [address] [place] in [place], [place] [address]. Patient understands discharge instructions? Yes Patient understands medications and regiment? Yes Patient understands to bring all medications to this visit? Yes  Ask patient:  Are you enrolled in My Chart: No, unsure if he wants to sign up. I advised him that if he decides to sign up to go to: [email or web address] to create an account  The pt states that Dr Harold Warner advised him while he was in the hospital that he will be out of work for 2 weeks and that he was going to provide him a letter for his employer at discharge but did not. He is requesting a call back to clarify how long he will be out of work as he is not due for his hospital f/u with Harold Warner.A-c until 11/29.

## 2021-06-24 ENCOUNTER — Telehealth: Payer: Self-pay | Admitting: Interventional Cardiology

## 2021-06-24 NOTE — Telephone Encounter (Signed)
Patient would like to know if he has to wait to go back to work until he is seen 07/19/2021.

## 2021-06-24 NOTE — Telephone Encounter (Signed)
How long will he need to be out of work?  When is his return date?  I'm sure he will need a letter with a date.

## 2021-06-24 NOTE — Telephone Encounter (Signed)
Spoke with pt and made him aware that I have sent his previous message to Dr. Katrinka Blazing and will call once he replies.  Pt appreciative for call.

## 2021-06-27 ENCOUNTER — Encounter: Payer: Self-pay | Admitting: *Deleted

## 2021-06-27 NOTE — Telephone Encounter (Signed)
Spoke with pt and made him aware that letter has been prepared.  He will come by the office to pick that up.  He also inquired about if [employer] had a generic.  Advised they do not at this time but I will place a discount card and pt assistance forms at the front as well.  Pt appreciative for call.

## 2021-07-04 ENCOUNTER — Telehealth (HOSPITAL_COMMUNITY): Payer: Self-pay

## 2021-07-04 ENCOUNTER — Telehealth: Payer: Self-pay | Admitting: Interventional Cardiology

## 2021-07-04 DIAGNOSIS — Z0279 Encounter for issue of other medical certificate: Secondary | ICD-10-CM

## 2021-07-04 NOTE — Telephone Encounter (Signed)
  Are you calling in reference to your FMLA or disability form?  FMLA    What is your question in regards to FMLA or disability form?  Patient states he has has FMLA paperwork to be signed and returned to his employer. He is hopeful that this can be completed ASAP because he assumes he is not ready to return to work. He states he has been extremely tired and congested since MI and having stent placed. He states he hasn't been sleeping well and unable to wear CPAP due to congestion. Patient would like to know when a good time will be to drop off paperwork.   Do you need copies of your medical records?   No   Are you waiting on a nurse to call you back with results or are you wanting copies of your results?  No

## 2021-07-04 NOTE — Telephone Encounter (Signed)
Pt states he has been dealing with SOB and coughing up clear phlegm since his MI.  Not sleeping well and can't tolerate his CPAP with his SOB.  Is doing a lot of walking throughout the day.  Drinking plenty of water.  Denies CP or other issues.  Seeing Allergy MD tomorrow.  Thinks it might be related to Brilinta.  Said he just gets winded and feels very fatigued with broken sleep.  Advised I will send message to Dr. Katrinka Blazing for review.    Explained the process for FMLA paperwork.  Pt will get that dropped off ASAP.

## 2021-07-04 NOTE — Telephone Encounter (Signed)
Called patient to see if he is interested in the Cardiac Rehab Program. Patient expressed interest. Explained scheduling process and went over insurance, patient verbalized understanding. Will contact patient for scheduling once f/u has been completed.  °

## 2021-07-04 NOTE — Telephone Encounter (Signed)
Will check insurance benefits closer to scheduling and/or into the new year 2023. 

## 2021-07-05 ENCOUNTER — Ambulatory Visit (INDEPENDENT_AMBULATORY_CARE_PROVIDER_SITE_OTHER): Payer: No Typology Code available for payment source | Admitting: Allergy and Immunology

## 2021-07-05 ENCOUNTER — Other Ambulatory Visit: Payer: Self-pay

## 2021-07-05 VITALS — BP 118/64 | HR 105 | Temp 97.4°F | Resp 16 | Ht 67.0 in | Wt 254.0 lb

## 2021-07-05 DIAGNOSIS — J4540 Moderate persistent asthma, uncomplicated: Secondary | ICD-10-CM | POA: Diagnosis not present

## 2021-07-05 DIAGNOSIS — J3089 Other allergic rhinitis: Secondary | ICD-10-CM

## 2021-07-05 DIAGNOSIS — K219 Gastro-esophageal reflux disease without esophagitis: Secondary | ICD-10-CM

## 2021-07-05 MED ORDER — CETIRIZINE HCL 10 MG PO TABS: 10.0000 mg | ORAL_TABLET | Freq: Two times a day (BID) | ORAL | 5 refills | Status: DC | PRN

## 2021-07-05 MED ORDER — ALBUTEROL SULFATE HFA 108 (90 BASE) MCG/ACT IN AERS: INHALATION_SPRAY | RESPIRATORY_TRACT | 2 refills | Status: DC

## 2021-07-05 MED ORDER — MONTELUKAST SODIUM 10 MG PO TABS: 10.0000 mg | ORAL_TABLET | Freq: Every day | ORAL | 5 refills | Status: DC

## 2021-07-05 MED ORDER — FAMOTIDINE 40 MG PO TABS: 40.0000 mg | ORAL_TABLET | Freq: Two times a day (BID) | ORAL | 5 refills | Status: DC

## 2021-07-05 NOTE — Progress Notes (Signed)
Cardiology Office Note    Date:  07/19/2021   ID:  Harold Warner, DOB 07-17-1961, MRN [id number]   PCP:  Anson Fret, MD   [facility]  Cardiologist:  Lesleigh Noe, MD   Advanced Practice Provider:  No care team member to display Electrophysiologist:  None   [phone]   Chief Complaint  Patient presents with   Hospitalization Follow-up     History of Present Illness:  Harold Warner is a 60 y.o. male with history of HTN, OSA on CPAP, HLD, prediabetes A1C 6.1,obesity who presented with STEMI 06/19/21 complicated cardiac arrest treated with 1 round of CPR, defib treated with DES LAD plan for DAPT x 1 yr. HS Troponin >24,000, f/u echo LVEF 55-60%.  Patient comes in for f/u. Feeling much better. Starting to walk some. Some DOE but doing well. Needs FMLA from [employer]. Has lost 25 lbs. Trying to eat better.     Past Medical History:  Diagnosis Date   Acid reflux    Asthma    Atypical chest pain    Hypertension    Hypokalemia    Hypopotassemia    Obesity    Recurrent upper respiratory infection (URI)     Past Surgical History:  Procedure Laterality Date   ADENOIDECTOMY     CORONARY/GRAFT ACUTE MI REVASCULARIZATION N/A 06/19/2021   Procedure: Coronary/Graft Acute MI Revascularization;  Surgeon: Orbie Pyo, MD;  Location: [facility];  Service: Cardiovascular;  Laterality: N/A;   HERNIA REPAIR     LEFT HEART CATH AND CORONARY ANGIOGRAPHY N/A 06/19/2021   Procedure: LEFT HEART CATH AND CORONARY ANGIOGRAPHY;  Surgeon: Orbie Pyo, MD;  Location: [facility];  Service: Cardiovascular;  Laterality: N/A;    Current Medications: Current Meds  Medication Sig   albuterol (PROVENTIL) (2.5 MG/3ML) 0.083% nebulizer solution INHALE 1 VIAL IN NEBULIZER BY MOUTH EVERY 6 HOURS FOR ASTHMA, COUGH, BREATHING, ALLERGY, USE WITH NEBULIZER MACHINE WHEN AT  HOME UNTIL COUGH RESOLVES THEN DECREASE DOSES DURING THE DAY  AND EVENING;  CONTINUE TO DO A NEBULIZER TREATMENT BEFORE BEDTIME DURING SPRING AND  FALL ALLERGY SEASON FOR ASTHMA, COUGH, BREATHING, ALLERGY, USE WITH NEBULIZER MACHINE WHEN AT   HOME UNTIL COUGH RESOLVES THEN DECREASE DOSES DURING THE DAY AND EVENING;   CONTINUE TO DO A NEBULIZER TREATMENT BEFORE BEDTIME DURING SPRING AND   FALL ALLERGY SEASON   albuterol (VENTOLIN HFA) 108 (90 Base) MCG/ACT inhaler Take 2 puffs every 4-6 hours if needed   aspirin EC 81 MG tablet Take 81 mg by mouth daily.   atorvastatin (LIPITOR) 80 MG tablet Take 1 tablet (80 mg total) by mouth daily.   busPIRone (BUSPAR) 10 MG tablet Take 10 mg by mouth daily.   cetirizine (ZYRTEC) 10 MG tablet Take 1 tablet (10 mg total) by mouth 2 (two) times daily as needed for allergies (Can use an extra dose during flares).   cholecalciferol (VITAMIN D3) 25 MCG (1000 UNIT) tablet Take 2,000 Units by mouth daily.   famotidine (PEPCID) 40 MG tablet Take 1 tablet (40 mg total) by mouth 2 (two) times daily.   fluticasone (FLONASE) 50 MCG/ACT nasal spray INSTILL 2 SPRAYS IN EACH NOSTRIL ONCE A DAY   fluticasone-salmeterol (ADVAIR HFA) 115-21 MCG/ACT inhaler INHALE 2 PUFFS BY MOUTH TWICE A DAY (RINSE MOUTH WELL WITH WATER AFTER EACH USE)   fluticasone-salmeterol (ADVAIR) 250-50 MCG/ACT AEPB Inhale 2 puffs into the lungs daily.   losartan (  COZAAR) 25 MG tablet Take 25 mg by mouth daily.   montelukast (SINGULAIR) 10 MG tablet Take 1 tablet (10 mg total) by mouth at bedtime.   nitroGLYCERIN (NITROSTAT) 0.4 MG SL tablet Place 1 tablet (0.4 mg total) under the tongue every 5 (five) minutes as needed.   ticagrelor (BRILINTA) 90 MG TABS tablet Take 1 tablet (90 mg total) by mouth 2 (two) times daily.   [DISCONTINUED] metoprolol succinate (TOPROL-XL) 25 MG 24 hr tablet Take 1 tablet (25 mg total) by mouth daily.     Allergies:   Adhesive [tape]   Social History   Socioeconomic History   Marital status: Unknown    Spouse name: Not on file    Number of children: Not on file   Years of education: Not on file   Highest education level: Not on file  Occupational History   Not on file  Tobacco Use   Smoking status: Never   Smokeless tobacco: Never  Substance and Sexual Activity   Alcohol use: No   Drug use: No   Sexual activity: Not Currently  Other Topics Concern   Not on file  Social History Narrative   ** Merged History Encounter **       ** Merged History Encounter **       Social Determinants of Health   Financial Resource Strain: Not on file  Food Insecurity: Not on file  Transportation Needs: Not on file  Physical Activity: Not on file  Stress: Not on file  Social Connections: Not on file     Family History:  The patient's  family history includes Hypertension in his mother.   ROS:   Please see the history of present illness.    ROS All other systems reviewed and are negative.   PHYSICAL EXAM:   VS:  BP 118/60   Pulse (!) 105   Ht 5\' 7"  (1.702 m)   Wt 259 lb (117.5 kg)   SpO2 96%   BMI 40.57 kg/m   Physical Exam  GEN: Obese, in no acute distress  Neck: no JVD, carotid bruits, or masses Cardiac:RRR; no murmurs, rubs, or gallops  Respiratory:  clear to auscultation bilaterally, normal work of breathing GI: soft, nontender, nondistended, + BS Ext: right arm without hematoma or hemorrhage otherwise lower ext without cyanosis, clubbing, or edema, Good distal pulses bilaterally Neuro:  Alert and Oriented x 3,  Psych: euthymic mood, full affect  Wt Readings from Last 3 Encounters:  07/19/21 259 lb (117.5 kg)  07/05/21 254 lb (115.2 kg)  06/19/21 297 lb 9.9 oz (135 kg)      Studies/Labs Reviewed:   EKG:  EKG is not ordered today.     Recent Labs: 06/19/2021: ALT 34 06/22/2021: BUN 13; Creatinine, Ser 1.19; Hemoglobin 15.2; Magnesium 2.0; Platelets 268; Potassium 3.7; Sodium 136   Lipid Panel    Component Value Date/Time   CHOL 192 06/19/2021 2138   TRIG 240 (H) 06/19/2021 2138   HDL 42  06/19/2021 2138   CHOLHDL 4.6 06/19/2021 2138   VLDL 48 (H) 06/19/2021 2138   LDLCALC 102 (H) 06/19/2021 2138    Additional studies/ records that were reviewed today include:  Cath: 06/19/21   Mid LM lesion is 20% stenosed.   Prox LAD lesion is 99% stenosed.   A stent was successfully placed.   Post intervention, there is a 0% residual stenosis.   LV end diastolic pressure is severely elevated.   1.  High-grade proximal LAD lesion treated  with 1 drug-eluting stent. 2.  Elevated LVEDP of 27 mmHg.   Recommendations: DAPT for 1 year, continue amiodarone until the morning and start beta-blocker and high-dose atorvastatin.  An echocardiogram will be performed in the morning.  Telemetric monitoring for 48 hours.   Diagnostic Dominance: Right Intervention     Echo: 06/20/21   IMPRESSIONS     1. Left ventricular ejection fraction, by estimation, is 55 to 60%. The  left ventricle has normal function. Left ventricular endocardial border  not optimally defined to evaluate regional wall motion. Left ventricular  diastolic function could not be  evaluated.   2. Right ventricular systolic function was not well visualized. The right  ventricular size is not well visualized.   3. The mitral valve was not well visualized. No evidence of mitral valve  regurgitation. No evidence of mitral stenosis.   4. The aortic valve was not assessed. Aortic valve regurgitation is not  visualized. No aortic stenosis is present.Essentiall non diagnostic study  due to poor windows and morbid obesity. u   FINDINGS   Left Ventricle: Left ventricular ejection fraction, by estimation, is 55  to 60%. The left ventricle has normal function. Left ventricular  endocardial border not optimally defined to evaluate regional wall motion.  Definity contrast agent was given IV to  delineate the left ventricular endocardial borders. The left ventricular  internal cavity size was normal in size. There is no left  ventricular  hypertrophy. Left ventricular diastolic function could not be evaluated.   Right Ventricle: The right ventricular size is not well visualized. Right  vetricular wall thickness was not assessed. Right ventricular systolic  function was not well visualized.   Left Atrium: Left atrial size was not well visualized.   Right Atrium: Right atrial size was not well visualized.   Pericardium: There is no evidence of pericardial effusion.   Mitral Valve: The mitral valve was not well visualized. No evidence of  mitral valve regurgitation. No evidence of mitral valve stenosis.   Tricuspid Valve: The tricuspid valve is not well visualized. Tricuspid  valve regurgitation is not demonstrated. No evidence of tricuspid  stenosis.   Aortic Valve: The aortic valve was not assessed. Aortic valve  regurgitation is not visualized. No aortic stenosis is present.   Pulmonic Valve: The pulmonic valve was not well visualized. Pulmonic valve  regurgitation is not visualized. No evidence of pulmonic stenosis.   Aorta: The aortic root is normal in size and structure.   Venous: The inferior vena cava was not well visualized.   IAS/Shunts: No atrial level shunt detected by color flow Doppler.  _____________   Risk Assessment/Calculations:         ASSESSMENT:    1. Coronary artery disease involving native coronary artery of native heart without angina pectoris   2. Essential hypertension   3. Hyperlipidemia, unspecified hyperlipidemia type   4. Obstructive sleep apnea   5. Prediabetes   6. Morbid obesity (HCC)      PLAN:  In order of problems listed above:  CAD S/P STEMI 06/19/21 complicated by in hospital cardiac arrest treated with 1 round of CPR, defib treated with DES LAD plan for DAPT x 1 yr. HS Troponin >24,000, f/u echo LVEF 55-60%. No angina. Some DOE and HR 105. Will increase toprol XL 50 mg daily. Was on 75 mg bid PTA  HTN controlled  HLD LDL 102 on liptior-check  labs in 2 months  OSA on CPAP  Prediabetes A1C 6.1  Obesity-has lost  about 25 lbs. Trying to change his diet.  Shared Decision Making/Informed Consent        Medication Adjustments/Labs and Tests Ordered: Current medicines are reviewed at length with the patient today.  Concerns regarding medicines are outlined above.  Medication changes, Labs and Tests ordered today are listed in the Patient Instructions below. Patient Instructions  Medication Instructions:  Your physician has recommended you make the following change in your medication:   INCREASE: Metoprolol Succinate to  daily  *If you need a refill on your cardiac medications before your next appointment, please call your pharmacy*   Lab Work: None If you have labs (blood work) drawn today and your tests are completely normal, you will receive your results only by: MyChart Message (if you have MyChart) OR A paper copy in the mail If you have any lab test that is abnormal or we need to change your treatment, we will call you to review the results.   Follow-Up: At [facility], you and your health needs are our priority.  As part of our continuing mission to provide you with exceptional heart care, we have created designated Provider Care Teams.  These Care Teams include your primary Cardiologist (physician) and Advanced Practice Providers (APPs -  Physician Assistants and Nurse Practitioners) who all work together to provide you with the care you need, when you need it.  We recommend signing up for the patient portal called "MyChart".  Sign up information is provided on this After Visit Summary.  MyChart is used to connect with patients for Virtual Visits (Telemedicine).  Patients are able to view lab/test results, encounter notes, upcoming appointments, etc.  Non-urgent messages can be sent to your provider as well.   To learn more about what you can do with MyChart, go to [email or web address].    Your next  appointment:   2 month(s)  The format for your next appointment:   In Person  Provider:   Lesleigh Noe, MD    Other Instructions Keep a record of your Heart Rate and call if it stays >100 Your provider recommends that you maintain 150 minutes per week of moderate aerobic activity.  Mediterranean Diet A Mediterranean diet refers to food and lifestyle choices that are based on the traditions of countries located on the [employer]. It focuses on eating more fruits, vegetables, whole grains, beans, nuts, seeds, and heart-healthy fats, and eating less dairy, meat, eggs, and processed foods with added sugar, salt, and fat. This way of eating has been shown to help prevent certain conditions and improve outcomes for people who have chronic diseases, like kidney disease and heart disease. What are tips for following this plan? Reading food labels Check the serving size of packaged foods. For foods such as rice and pasta, the serving size refers to the amount of cooked product, not dry. Check the total fat in packaged foods. Avoid foods that have saturated fat or trans fats. Check the ingredient list for added sugars, such as corn syrup. Shopping  Buy a variety of foods that offer a balanced diet, including: Fresh fruits and vegetables (produce). Grains, beans, nuts, and seeds. Some of these may be available in unpackaged forms or large amounts (in bulk). Fresh seafood. Poultry and eggs. Low-fat dairy products. Buy whole ingredients instead of prepackaged foods. Buy fresh fruits and vegetables in-season from local farmers markets. Buy plain frozen fruits and vegetables. If you do not have access to quality fresh seafood, buy precooked frozen shrimp or  canned fish, such as tuna, salmon, or sardines. Stock your pantry so you always have certain foods on hand, such as olive oil, canned tuna, canned tomatoes, rice, pasta, and beans. Cooking Cook foods with extra-virgin olive oil  instead of using butter or other vegetable oils. Have meat as a side dish, and have vegetables or grains as your main dish. This means having meat in small portions or adding small amounts of meat to foods like pasta or stew. Use beans or vegetables instead of meat in common dishes like chili or lasagna. Experiment with different cooking methods. Try roasting, broiling, steaming, and sauting vegetables. Add frozen vegetables to soups, stews, pasta, or rice. Add nuts or seeds for added healthy fats and plant protein at each meal. You can add these to yogurt, salads, or vegetable dishes. Marinate fish or vegetables using olive oil, lemon juice, garlic, and fresh herbs. Meal planning Plan to eat one vegetarian meal one day each week. Try to work up to two vegetarian meals, if possible. Eat seafood two or more times a week. Have healthy snacks readily available, such as: Vegetable sticks with hummus. Greek yogurt. Fruit and nut trail mix. Eat balanced meals throughout the week. This includes: Fruit: 2-3 servings a day. Vegetables: 4-5 servings a day. Low-fat dairy: 2 servings a day. Fish, poultry, or lean meat: 1 serving a day. Beans and legumes: 2 or more servings a week. Nuts and seeds: 1-2 servings a day. Whole grains: 6-8 servings a day. Extra-virgin olive oil: 3-4 servings a day. Limit red meat and sweets to only a few servings a month. Lifestyle  Cook and eat meals together with your family, when possible. Drink enough fluid to keep your urine pale yellow. Be physically active every day. This includes: Aerobic exercise like running or swimming. Leisure activities like gardening, walking, or housework. Get 7-8 hours of sleep each night. If recommended by your health care provider, drink red wine in moderation. This means 1 glass a day for nonpregnant women and 2 glasses a day for men. A glass of wine equals 5 oz (150 mL). What foods should I eat? Fruits Apples. Apricots. Avocado.  Berries. Bananas. Cherries. Dates. Figs. Grapes. Lemons. Melon. Oranges. Peaches. Plums. Pomegranate. Vegetables Artichokes. Beets. Broccoli. Cabbage. Carrots. Eggplant. Green beans. Chard. Kale. Spinach. Onions. Leeks. Peas. Squash. Tomatoes. Peppers. Radishes. Grains Whole-grain pasta. Brown rice. Bulgur wheat. Polenta. Couscous. Whole-wheat bread. Orpah Cobb. Meats and other proteins Beans. Almonds. Sunflower seeds. Pine nuts. Peanuts. Cod. Salmon. Scallops. Shrimp. Tuna. Tilapia. Clams. Oysters. Eggs. Poultry without skin. Dairy Low-fat milk. Cheese. Greek yogurt. Fats and oils Extra-virgin olive oil. Avocado oil. Grapeseed oil. Beverages Water. Red wine. Herbal tea. Sweets and desserts Greek yogurt with honey. Baked apples. Poached pears. Trail mix. Seasonings and condiments Basil. Cilantro. Coriander. Cumin. Mint. Parsley. Sage. Rosemary. Tarragon. Garlic. Oregano. Thyme. Pepper. Balsamic vinegar. Tahini. Hummus. Tomato sauce. Olives. Mushrooms. The items listed above may not be a complete list of foods and beverages you can eat. Contact a dietitian for more information. What foods should I limit? This is a list of foods that should be eaten rarely or only on special occasions. Fruits Fruit canned in syrup. Vegetables Deep-fried potatoes (french fries). Grains Prepackaged pasta or rice dishes. Prepackaged cereal with added sugar. Prepackaged snacks with added sugar. Meats and other proteins Beef. Pork. Lamb. Poultry with skin. Hot dogs. Tomasa Blase. Dairy Ice cream. Sour cream. Whole milk. Fats and oils Butter. Canola oil. Vegetable oil. Beef fat (tallow). Lard. Beverages Juice.  Sugar-sweetened soft drinks. Beer. Liquor and spirits. Sweets and desserts Cookies. Cakes. Pies. Candy. Seasonings and condiments Mayonnaise. Pre-made sauces and marinades. The items listed above may not be a complete list of foods and beverages you should limit. Contact a dietitian for more  information. Summary The Mediterranean diet includes both food and lifestyle choices. Eat a variety of fresh fruits and vegetables, beans, nuts, seeds, and whole grains. Limit the amount of red meat and sweets that you eat. If recommended by your health care provider, drink red wine in moderation. This means 1 glass a day for nonpregnant women and 2 glasses a day for men. A glass of wine equals 5 oz (150 mL). This information is not intended to replace advice given to you by your health care provider. Make sure you discuss any questions you have with your health care provider. Document Revised: 09/12/2019 Document Reviewed: 07/10/2019 Elsevier Patient Education  [address].     Signed, Jacolyn Reedy, [place]  07/19/2021 11:20 AM    [facility] [address] [place], [place], [place]  [address] Phone: [phone]; Fax: [phone]

## 2021-07-05 NOTE — Progress Notes (Signed)
[place] - [place] - [place] - [name] - [place]   Follow-up Note  Referring Provider: Anson Fret, MD Primary Provider: Anson Fret, MD Date of Office Visit: 07/05/2021  Subjective:   Harold Warner (DOB: Jun 28, 1961) is a 60 y.o. male who [facility] in re-evaluation of the following:  HPI: Harold Warner returns to this clinic in evaluation of asthma and allergic rhinitis and reflux.  I last saw him in this clinic on 14 September 2020.  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  At that point in time he was started on metoprolol.  He is scheduled to enter cardiac rehab soon.  Since that event he has noticed some breathing issues.  He has had some wheezing and has had some coughing.  He was using his Advair 1 time per day but when his wheezing and coughing started soon after his hospitalization he increased the dose to twice a day.  He does not use a short acting bronchodilator.  He cannot really use these CPAP mask at night because he feels like his breathing is just not as good.  He does not have any sputum production and he has very little upper airway symptoms at this point in time and he believes that his reflux is under excellent control.  He has had 3 COVID vaccines and contracted COVID January 2022.  Allergies as of 07/05/2021       Reactions   Adhesive [tape] Rash        Medication List    albuterol (2.5 MG/3ML) 0.083% nebulizer solution Commonly known as: PROVENTIL INHALE 1 VIAL IN NEBULIZER BY MOUTH EVERY 6 HOURS FOR ASTHMA, COUGH, BREATHING, ALLERGY, USE WITH NEBULIZER MACHINE WHEN AT  HOME UNTIL COUGH RESOLVES THEN DECREASE DOSES DURING THE DAY AND EVENING;  CONTINUE TO DO A NEBULIZER TREATMENT BEFORE BEDTIME DURING SPRING AND  FALL ALLERGY SEASON FOR ASTHMA, COUGH,  BREATHING, ALLERGY, USE WITH NEBULIZER MACHINE WHEN AT   HOME UNTIL COUGH RESOLVES THEN DECREASE DOSES DURING THE DAY AND EVENING;   CONTINUE TO DO A NEBULIZER TREATMENT BEFORE BEDTIME DURING SPRING AND   FALL ALLERGY SEASON   albuterol 108 (90 Base) MCG/ACT inhaler Commonly known as: VENTOLIN HFA Take 2 puffs every 4-6 hours if needed   aspirin EC 81 MG tablet Take 81 mg by mouth daily.   atorvastatin 80 MG tablet Commonly known as: LIPITOR Take 1 tablet (80 mg total) by mouth daily.   Brilinta 90 MG Tabs tablet Generic drug: ticagrelor Take 1 tablet (90 mg total) by mouth 2 (two) times daily.   busPIRone 10 MG tablet Commonly known as: BUSPAR Take 10 mg by mouth daily.   cetirizine 10 MG tablet Commonly known as: ZYRTEC Take 1 tablet (10 mg total) by mouth 2 (two) times daily as needed for allergies (Can use an extra dose during flares).   cholecalciferol 25 MCG (1000 UNIT) tablet Commonly known as: VITAMIN D3 Take 2,000 Units by mouth daily.   famotidine 40 MG tablet Commonly known as: PEPCID Take 1 tablet (40 mg total) by mouth 2 (two) times daily.   fluoruracil 0.5 % cream Commonly known as: CARAC APPLY AS DIRECTED TO AFFECTED AREA DAILY   fluticasone 50 MCG/ACT nasal spray Commonly known as: FLONASE INSTILL 2 SPRAYS IN EACH NOSTRIL ONCE A DAY   fluticasone-salmeterol 250-50 MCG/ACT Aepb Commonly  known as: ADVAIR Inhale 2 puffs into the lungs daily.   Wixela Inhub 250-50 MCG/ACT Aepb Generic drug: fluticasone-salmeterol Inhale 1 puff into the lungs in the morning and at bedtime.   fluticasone-salmeterol 115-21 MCG/ACT inhaler Commonly known as: ADVAIR HFA INHALE 2 PUFFS BY MOUTH TWICE A DAY (RINSE MOUTH WELL WITH WATER AFTER EACH USE)   losartan 25 MG tablet Commonly known as: COZAAR Take 25 mg by mouth daily.   metoprolol succinate 25 MG 24 hr tablet Commonly known as: TOPROL-XL Take 1 tablet (25 mg total) by mouth daily.   metoprolol tartrate 50 MG  tablet Commonly known as: LOPRESSOR TAKE ONE-HALF TABLET BY MOUTH TWICE A DAY FOR HEART  FOR HEART, BLOOD PRESSURE, HELP AFTER HEART ATTACK, HELP PREVENT FUTURE  STROKE OR HEART ATTACK.  CONTINUE PER OUTSIDE CARDIOLOGY, DR. Mendel Ryder. FOR HEART  FOR HEART, BLOOD PRESSURE, HELP AFTER HEART ATTACK, HELP PREVENT FUTURE   STROKE OR HEART ATTACK.  CONTINUE PER OUTSIDE CARDIOLOGY, DR. Mendel Ryder.   montelukast 10 MG tablet Commonly known as: SINGULAIR Take 1 tablet (10 mg total) by mouth at bedtime.   nitroGLYCERIN 0.4 MG SL tablet Commonly known as: Nitrostat Place 1 tablet (0.4 mg total) under the tongue every 5 (five) minutes as needed.    Past Medical History:  Diagnosis Date   Acid reflux    Asthma    Atypical chest pain    Hypertension    Hypokalemia    Hypopotassemia    Obesity    Recurrent upper respiratory infection (URI)     Past Surgical History:  Procedure Laterality Date   ADENOIDECTOMY     CORONARY/GRAFT ACUTE MI REVASCULARIZATION N/A 06/19/2021   Procedure: Coronary/Graft Acute MI Revascularization;  Surgeon: Orbie Pyo, MD;  Location: [facility];  Service: Cardiovascular;  Laterality: N/A;   HERNIA REPAIR     LEFT HEART CATH AND CORONARY ANGIOGRAPHY N/A 06/19/2021   Procedure: LEFT HEART CATH AND CORONARY ANGIOGRAPHY;  Surgeon: Orbie Pyo, MD;  Location: [facility];  Service: Cardiovascular;  Laterality: N/A;    Review of systems negative except as noted in HPI / PMHx or noted below:  Review of Systems  Constitutional: Negative.   HENT: Negative.    Eyes: Negative.   Respiratory: Negative.    Cardiovascular: Negative.   Gastrointestinal: Negative.   Genitourinary: Negative.   Musculoskeletal: Negative.   Skin: Negative.   Neurological: Negative.   Endo/Heme/Allergies: Negative.   Psychiatric/Behavioral: Negative.      Objective:   Vitals:   07/05/21 1356  BP: 118/64  Pulse: (!) 105  Resp: 16  Temp: (!) 97.4 F (36.3 C)   SpO2: 95%   Height: 5\' 7"  (170.2 cm)  Weight: 254 lb (115.2 kg)   Physical Exam Constitutional:      Appearance: He is not diaphoretic.  HENT:     Head: Normocephalic.     Right Ear: Tympanic membrane, ear canal and external ear normal.     Left Ear: Tympanic membrane, ear canal and external ear normal.     Nose: Nose normal. No mucosal edema or rhinorrhea.     Mouth/Throat:     Pharynx: Uvula midline. No oropharyngeal exudate.  Eyes:     Conjunctiva/sclera: Conjunctivae normal.  Neck:     Thyroid: No thyromegaly.     Trachea: Trachea normal. No tracheal tenderness or tracheal deviation.  Cardiovascular:     Rate and Rhythm: Normal rate and regular rhythm.  Heart sounds: Normal heart sounds, S1 normal and S2 normal. No murmur heard. Pulmonary:     Effort: No respiratory distress.     Breath sounds: Normal breath sounds. No stridor. No wheezing or rales.  Lymphadenopathy:     Head:     Right side of head: No tonsillar adenopathy.     Left side of head: No tonsillar adenopathy.     Cervical: No cervical adenopathy.  Skin:    Findings: No erythema or rash.     Nails: There is no clubbing.  Neurological:     Mental Status: He is alert.    Diagnostics:    Spirometry was performed and demonstrated an FEV1 of 2.56 at 81 % of predicted.  Results of a chest x-ray obtained 19 June 2021 identified the following:  The heart and mediastinal contours are unchanged.  No focal consolidation. No pulmonary edema. No pleural effusion. No pneumothorax.  Results of an echocardiogram obtained 20 June 2021 identified the following:   1. Left ventricular ejection fraction, by estimation, is 55 to 60%. The  left ventricle has normal function. Left ventricular endocardial border  not optimally defined to evaluate regional wall motion. Left ventricular  diastolic function could not be  evaluated.   2. Right ventricular systolic function was not well visualized. The right   ventricular size is not well visualized.   3. The mitral valve was not well visualized. No evidence of mitral valve  regurgitation. No evidence of mitral stenosis.   4. The aortic valve was not assessed. Aortic valve regurgitation is not  visualized. No aortic stenosis is present.Essentiall non diagnostic study  due to poor windows and morbid obesity.  Results of blood tests obtained 22 June 2021 identifies WBC 12.5, hemoglobin 15.2, platelet 268, creatinine 1.19 mg/DL  Assessment and Plan:   1. Not well controlled moderate persistent asthma   2. Other allergic rhinitis   3. Gastroesophageal reflux disease, unspecified whether esophagitis present     1.  Consistently use famotidine to 40 mg twice a day  2.  Consistently use WIXELA 250 - 1 inhalations twice a day (empty lungs)  3.  Consistently use montelukast 10 mg - 1 tablet once a day  4.  Start prednisone 10 mg - 1 tablet 1 time per day for 10 days  5.  If needed:   A.  Albuterol HFA -2 inhalations every 4-6 hours  B.  Mucinex DM-2 tablets twice a day  6.  Return to clinic in 4 weeks or earlier if problem  7. Metoprolol???  Brilinta???     I am going to assume that [name] has inflammation of his airway and treat him with a low-dose of systemic steroids while he consistently uses his inhaled combination drug.  I am not exactly sure why he is having his breathing problems.  This could be because of inflammation but it could also be that he had introduction of metoprolol and/or Brilinta during his cardiac event over the course of the past 3 weeks.  I will regroup with him in 4 weeks to consider further evaluation and treatment based upon his response to this approach.  We need to make sure that his breathing is good enough that he can consistently use his CPAP machine.  Laurette Schimke, MD Allergy / Immunology [facility]

## 2021-07-05 NOTE — Patient Instructions (Addendum)
  1.  Consistently use famotidine to 40 mg twice a day  2.  Consistently use WIXELA 250 - 1 inhalations twice a day (empty lungs)  3.  Consistently use montelukast 10 mg - 1 tablet once a day  4.  Start prednisone 10 mg - 1 tablet 1 time per day for 10 days  5.  If needed:   A.  Albuterol HFA -2 inhalations every 4-6 hours  B.  Mucinex DM-2 tablets twice a day  6.  Return to clinic in 4 weeks or earlier if problem  7. Metoprolol???  Brilinta???

## 2021-07-06 ENCOUNTER — Encounter: Payer: Self-pay | Admitting: Allergy and Immunology

## 2021-07-06 ENCOUNTER — Telehealth (HOSPITAL_COMMUNITY): Payer: Self-pay

## 2021-07-06 ENCOUNTER — Other Ambulatory Visit (HOSPITAL_COMMUNITY): Payer: Self-pay

## 2021-07-06 NOTE — Telephone Encounter (Signed)
Transitions of Care Pharmacy  ° °Call attempted for a pharmacy transitions of care follow-up. HIPAA appropriate voicemail was left with call back information provided.  ° °Call attempt #1. Will follow-up in 2-3 days.  °  °

## 2021-07-07 ENCOUNTER — Telehealth (HOSPITAL_COMMUNITY): Payer: Self-pay | Admitting: Pharmacist

## 2021-07-07 ENCOUNTER — Other Ambulatory Visit (HOSPITAL_COMMUNITY): Payer: Self-pay

## 2021-07-07 NOTE — Telephone Encounter (Signed)
Transitions of Care Pharmacy   Call attempted for a pharmacy transitions of care follow-up. Voicemail not available on home phone number.    Call attempt #2. Will follow-up in 1-3 days.

## 2021-07-07 NOTE — Telephone Encounter (Signed)
Did PCP provide any insight about SOB and cough?  If SOB is at rest and spontaneous, it could be related to [place]. Need more info. When does it occur.  ----- Message -----  From: Harold Sicks, RN  Sent: 07/04/2021  10:56 AM EST  To: Harold Records, MD, Harold Sicks, RN   Spoke with pt and he states allergist gave him prednisone and an inhaler to help and his SOB is much better.  No issues currently.  Advised if SOB returns once he is done with Prednisone, let us know.  Pt appreciative for call.

## 2021-07-08 ENCOUNTER — Other Ambulatory Visit (HOSPITAL_COMMUNITY): Payer: Self-pay

## 2021-07-08 ENCOUNTER — Telehealth: Payer: Self-pay | Admitting: Interventional Cardiology

## 2021-07-08 ENCOUNTER — Telehealth (HOSPITAL_COMMUNITY): Payer: Self-pay | Admitting: Pharmacist

## 2021-07-08 NOTE — Telephone Encounter (Signed)
Forms from Lorenso Quirino was received on 07/04/2021. Completed patient auth attached. Took forms to Dr. Katrinka Blazing box for completion. 07/08/2021 JMM

## 2021-07-08 NOTE — Telephone Encounter (Signed)
Pharmacy Transitions of Care Follow-up Telephone Call  Date of discharge: 06/22/21  Discharge Diagnosis: STEMI w/DES  How have you been since you were released from the hospital?  well  Medication changes made at discharge:     START taking: atorvastatin (LIPITOR)  Brilinta (ticagrelor)  metoprolol succinate (TOPROL-XL)  nitroGLYCERIN (Nitrostat)  STOP taking: budesonide-formoterol 160-4.5 MCG/ACT inhaler (Symbicort)  chlorpheniramine-HYDROcodone 10-8 MG/5ML Suer (Tussionex Pennkinetic ER)  dextromethorphan-guaiFENesin 30-600 MG 12hr tablet (MUCINEX DM)  hydrochlorothiazide 25 MG tablet (HYDRODIURIL)  ranitidine 150 MG tablet (ZANTAC)   Medication changes verified by the patient? Yes    Medication Accessibility:  Home Pharmacy:  [facility]  Was the patient provided with refills on discharged medications? Yes   Have all prescriptions been transferred [facility] to home pharmacy?  No - not needed, [facility] since he was d/c from hospital.  Is the patient able to afford medications? Has insurance  Test claim on Brilinta says med is not covered by plan.     Medication Review:  TICAGRELOR (BRILINTA) Ticagrelor 90 mg BID initiated on 06/22/21.  - Educated patient on expected duration of therapy of aspirin with ticagrelor of 1 year  - Discussed importance of taking medication around the same time every day, - Advised patient of medications to avoid (NSAIDs, aspirin maintenance doses>100 mg daily) - Educated that Tylenol (acetaminophen) will be the preferred analgesic to prevent risk of bleeding  - Emphasized importance of monitoring for signs and symptoms of bleeding (abnormal bruising, prolonged bleeding, nose bleeds, bleeding from gums, discolored urine, black tarry stools)  - Educated patient to notify doctor if shortness of breath or abnormal heartbeat occur - Advised patient to alert all providers of antiplatelet therapy prior to  starting a n  Follow-up Appointments:  PCP Hospital f/u appt confirmed? None currently scheduled   Specialist Hospital f/u appt confirmed? Scheduled to see Dr. Geni Bers on 07/19/21 @ 10:45am.   If their condition worsens, is the pt aware to call PCP or go to the Emergency Dept.? yes  Final Patient Assessment: Patient is taking new medications correctly and has not missed any doses.  He is tolerating well with no adverse events.  We have reviewed bleed risk and importance of compliance.  Pt was appreciative of the call.

## 2021-07-19 ENCOUNTER — Other Ambulatory Visit: Payer: Self-pay

## 2021-07-19 ENCOUNTER — Encounter: Payer: Self-pay | Admitting: Allergy and Immunology

## 2021-07-19 ENCOUNTER — Ambulatory Visit (INDEPENDENT_AMBULATORY_CARE_PROVIDER_SITE_OTHER): Payer: No Typology Code available for payment source | Admitting: Physician Assistant

## 2021-07-19 ENCOUNTER — Encounter: Payer: Self-pay | Admitting: Physician Assistant

## 2021-07-19 VITALS — BP 118/60 | HR 105 | Ht 67.0 in | Wt 259.0 lb

## 2021-07-19 DIAGNOSIS — R7303 Prediabetes: Secondary | ICD-10-CM

## 2021-07-19 DIAGNOSIS — E785 Hyperlipidemia, unspecified: Secondary | ICD-10-CM | POA: Diagnosis not present

## 2021-07-19 DIAGNOSIS — I10 Essential (primary) hypertension: Secondary | ICD-10-CM | POA: Diagnosis not present

## 2021-07-19 DIAGNOSIS — G4733 Obstructive sleep apnea (adult) (pediatric): Secondary | ICD-10-CM

## 2021-07-19 DIAGNOSIS — I2510 Atherosclerotic heart disease of native coronary artery without angina pectoris: Secondary | ICD-10-CM

## 2021-07-19 MED ORDER — METOPROLOL SUCCINATE ER 50 MG PO TB24: 50.0000 mg | ORAL_TABLET | Freq: Every day | ORAL | 3 refills | Status: DC

## 2021-07-19 NOTE — Patient Instructions (Signed)
Medication Instructions:  Your physician has recommended you make the following change in your medication:   INCREASE: Metoprolol Succinate to 50mg  daily  *If you need a refill on your cardiac medications before your next appointment, please call your pharmacy*   Lab Work: None If you have labs (blood work) drawn today and your tests are completely normal, you will receive your results only by: MyChart Message (if you have MyChart) OR A paper copy in the mail If you have any lab test that is abnormal or we need to change your treatment, we will call you to review the results.   Follow-Up: At [facility], you and your health needs are our priority.  As part of our continuing mission to provide you with exceptional heart care, we have created designated Provider Care Teams.  These Care Teams include your primary Cardiologist (physician) and Advanced Practice Providers (APPs -  Physician Assistants and Nurse Practitioners) who all work together to provide you with the care you need, when you need it.  We recommend signing up for the patient portal called "MyChart".  Sign up information is provided on this After Visit Summary.  MyChart is used to connect with patients for Virtual Visits (Telemedicine).  Patients are able to view lab/test results, encounter notes, upcoming appointments, etc.  Non-urgent messages can be sent to your provider as well.   To learn more about what you can do with MyChart, go to [facility].    Your next appointment:   2 month(s)  The format for your next appointment:   In Person  Provider:   [email or web address], MD    Other Instructions Keep a record of your Heart Rate and call if it stays >100 Your provider recommends that you maintain 150 minutes per week of moderate aerobic activity.  Mediterranean Diet A Mediterranean diet refers to food and lifestyle choices that are based on the traditions of countries located on the [place].  It focuses on eating more fruits, vegetables, whole grains, beans, nuts, seeds, and heart-healthy fats, and eating less dairy, meat, eggs, and processed foods with added sugar, salt, and fat. This way of eating has been shown to help prevent certain conditions and improve outcomes for people who have chronic diseases, like kidney disease and heart disease. What are tips for following this plan? Reading food labels Check the serving size of packaged foods. For foods such as rice and pasta, the serving size refers to the amount of cooked product, not dry. Check the total fat in packaged foods. Avoid foods that have saturated fat or trans fats. Check the ingredient list for added sugars, such as corn syrup. Shopping  Buy a variety of foods that offer a balanced diet, including: Fresh fruits and vegetables (produce). Grains, beans, nuts, and seeds. Some of these may be available in unpackaged forms or large amounts (in bulk). Fresh seafood. Poultry and eggs. Low-fat dairy products. Buy whole ingredients instead of prepackaged foods. Buy fresh fruits and vegetables in-season from local farmers markets. Buy plain frozen fruits and vegetables. If you do not have access to quality fresh seafood, buy precooked frozen shrimp or canned fish, such as tuna, salmon, or sardines. Stock your pantry so you always have certain foods on hand, such as olive oil, canned tuna, canned tomatoes, rice, pasta, and beans. Cooking Cook foods with extra-virgin olive oil instead of using butter or other vegetable oils. Have meat as a side dish, and have vegetables or grains as your main  dish. This means having meat in small portions or adding small amounts of meat to foods like pasta or stew. Use beans or vegetables instead of meat in common dishes like chili or lasagna. Experiment with different cooking methods. Try roasting, broiling, steaming, and sauting vegetables. Add frozen vegetables to soups, stews, pasta, or  rice. Add nuts or seeds for added healthy fats and plant protein at each meal. You can add these to yogurt, salads, or vegetable dishes. Marinate fish or vegetables using olive oil, lemon juice, garlic, and fresh herbs. Meal planning Plan to eat one vegetarian meal one day each week. Try to work up to two vegetarian meals, if possible. Eat seafood two or more times a week. Have healthy snacks readily available, such as: Vegetable sticks with hummus. Greek yogurt. Fruit and nut trail mix. Eat balanced meals throughout the week. This includes: Fruit: 2-3 servings a day. Vegetables: 4-5 servings a day. Low-fat dairy: 2 servings a day. Fish, poultry, or lean meat: 1 serving a day. Beans and legumes: 2 or more servings a week. Nuts and seeds: 1-2 servings a day. Whole grains: 6-8 servings a day. Extra-virgin olive oil: 3-4 servings a day. Limit red meat and sweets to only a few servings a month. Lifestyle  Cook and eat meals together with your family, when possible. Drink enough fluid to keep your urine pale yellow. Be physically active every day. This includes: Aerobic exercise like running or swimming. Leisure activities like gardening, walking, or housework. Get 7-8 hours of sleep each night. If recommended by your health care provider, drink red wine in moderation. This means 1 glass a day for nonpregnant women and 2 glasses a day for men. A glass of wine equals 5 oz (150 mL). What foods should I eat? Fruits Apples. Apricots. Avocado. Berries. Bananas. Cherries. Dates. Figs. Grapes. Lemons. Melon. Oranges. Peaches. Plums. Pomegranate. Vegetables Artichokes. Beets. Broccoli. Cabbage. Carrots. Eggplant. Green beans. Chard. Kale. Spinach. Onions. Leeks. Peas. Squash. Tomatoes. Peppers. Radishes. Grains Whole-grain pasta. Brown rice. Bulgur wheat. Polenta. Couscous. Whole-wheat bread. Orpah Cobb. Meats and other proteins Beans. Almonds. Sunflower seeds. Pine nuts. Peanuts. Cod.  Salmon. Scallops. Shrimp. Tuna. Tilapia. Clams. Oysters. Eggs. Poultry without skin. Dairy Low-fat milk. Cheese. Greek yogurt. Fats and oils Extra-virgin olive oil. Avocado oil. Grapeseed oil. Beverages Water. Red wine. Herbal tea. Sweets and desserts Greek yogurt with honey. Baked apples. Poached pears. Trail mix. Seasonings and condiments Basil. Cilantro. Coriander. Cumin. Mint. Parsley. Sage. Rosemary. Tarragon. Garlic. Oregano. Thyme. Pepper. Balsamic vinegar. Tahini. Hummus. Tomato sauce. Olives. Mushrooms. The items listed above may not be a complete list of foods and beverages you can eat. Contact a dietitian for more information. What foods should I limit? This is a list of foods that should be eaten rarely or only on special occasions. Fruits Fruit canned in syrup. Vegetables Deep-fried potatoes (french fries). Grains Prepackaged pasta or rice dishes. Prepackaged cereal with added sugar. Prepackaged snacks with added sugar. Meats and other proteins Beef. Pork. Lamb. Poultry with skin. Hot dogs. Tomasa Blase. Dairy Ice cream. Sour cream. Whole milk. Fats and oils Butter. Canola oil. Vegetable oil. Beef fat (tallow). Lard. Beverages Juice. Sugar-sweetened soft drinks. Beer. Liquor and spirits. Sweets and desserts Cookies. Cakes. Pies. Candy. Seasonings and condiments Mayonnaise. Pre-made sauces and marinades. The items listed above may not be a complete list of foods and beverages you should limit. Contact a dietitian for more information. Summary The Mediterranean diet includes both food and lifestyle choices. Eat a variety of fresh  fruits and vegetables, beans, nuts, seeds, and whole grains. Limit the amount of red meat and sweets that you eat. If recommended by your health care provider, drink red wine in moderation. This means 1 glass a day for nonpregnant women and 2 glasses a day for men. A glass of wine equals 5 oz (150 mL). This information is not intended to replace  advice given to you by your health care provider. Make sure you discuss any questions you have with your health care provider. Document Revised: 09/12/2019 Document Reviewed: 07/10/2019 Elsevier Patient Education  2022 [employer].

## 2021-07-26 ENCOUNTER — Encounter (HOSPITAL_COMMUNITY): Payer: Self-pay

## 2021-07-28 ENCOUNTER — Telehealth (HOSPITAL_COMMUNITY): Payer: Self-pay

## 2021-07-28 NOTE — Telephone Encounter (Signed)
Pt called back, pt stated that he is interested in the cardiac rehab program, I advised pt that [facility] to [place] for cardiac rehab, pt understood and stated that he would call them and he will also mention it when he goes to his appt next week. Placed pt ppw in the [place] referrals folder.

## 2021-08-02 ENCOUNTER — Encounter: Payer: Self-pay | Admitting: Allergy and Immunology

## 2021-08-02 ENCOUNTER — Ambulatory Visit (INDEPENDENT_AMBULATORY_CARE_PROVIDER_SITE_OTHER): Payer: No Typology Code available for payment source | Admitting: Allergy and Immunology

## 2021-08-02 ENCOUNTER — Other Ambulatory Visit: Payer: Self-pay

## 2021-08-02 VITALS — BP 102/62 | HR 64 | Temp 98.0°F | Resp 16 | Ht 67.0 in | Wt 257.4 lb

## 2021-08-02 DIAGNOSIS — J3089 Other allergic rhinitis: Secondary | ICD-10-CM

## 2021-08-02 DIAGNOSIS — K219 Gastro-esophageal reflux disease without esophagitis: Secondary | ICD-10-CM

## 2021-08-02 DIAGNOSIS — J4540 Moderate persistent asthma, uncomplicated: Secondary | ICD-10-CM

## 2021-08-02 DIAGNOSIS — G4733 Obstructive sleep apnea (adult) (pediatric): Secondary | ICD-10-CM | POA: Diagnosis not present

## 2021-08-02 MED ORDER — ALBUTEROL SULFATE HFA 108 (90 BASE) MCG/ACT IN AERS: INHALATION_SPRAY | RESPIRATORY_TRACT | 2 refills | Status: DC

## 2021-08-02 MED ORDER — MUCINEX DM 30-600 MG PO TB12: 2.0000 | ORAL_TABLET | Freq: Two times a day (BID) | ORAL | 1 refills | Status: DC | PRN

## 2021-08-02 MED ORDER — CETIRIZINE HCL 10 MG PO TABS
10.0000 mg | ORAL_TABLET | Freq: Two times a day (BID) | ORAL | 5 refills | Status: DC | PRN
Start: 2021-08-02 — End: 2021-10-25

## 2021-08-02 MED ORDER — MONTELUKAST SODIUM 10 MG PO TABS: 10.0000 mg | ORAL_TABLET | Freq: Every day | ORAL | 5 refills | Status: DC

## 2021-08-02 MED ORDER — FAMOTIDINE 40 MG PO TABS: 40.0000 mg | ORAL_TABLET | Freq: Two times a day (BID) | ORAL | 5 refills | Status: DC

## 2021-08-02 NOTE — Progress Notes (Signed)
[place] - [place] - [place] - [name] - [place]   Follow-up Note  Referring Provider: Anson Fret, MD Primary Provider: Anson Fret, MD Date of Office Visit: 08/02/2021  Subjective:   Harold Chang Warner (DOB: [birth year]) is a 60 y.o. male who [facility] in re-evaluation of the following:  HPI: Harold Warner returns to this clinic in evaluation of asthma, allergic rhinitis, and reflux and sleep apnea.  I last saw him in his clinic on 05 July 2021.  During his last visit he was having some breathing issues and some wheezing and some coughing.  This occurred in the context of having undergone ventricular fibrillation/cardiac arrest/defibrillation in October 2022.  He is feeling better regarding his breathing.  His "congestion" has basically resolved.  He has no need to use a short acting bronchodilator.  He has not been having any problems with his nose.  He has had no issues with reflux.  He has not restarted his CPAP yet.  Allergies as of 08/02/2021       Reactions   Adhesive [tape] Rash        Medication List    albuterol 108 (90 Base) MCG/ACT inhaler Commonly known as: VENTOLIN HFA Take 2 puffs every 4-6 hours if needed   aspirin EC 81 MG tablet Take 81 mg by mouth daily.   atorvastatin 80 MG tablet Commonly known as: LIPITOR Take 1 tablet (80 mg total) by mouth daily.   Brilinta 90 MG Tabs tablet Generic drug: ticagrelor Take 1 tablet (90 mg total) by mouth 2 (two) times daily.   busPIRone 10 MG tablet Commonly known as: BUSPAR Take 10 mg by mouth daily.   cetirizine 10 MG tablet Commonly known as: ZYRTEC Take 1 tablet (10 mg total) by mouth 2 (two) times daily as needed for allergies (Can use an extra dose during flares).   cholecalciferol 25 MCG (1000 UNIT) tablet Commonly known as: VITAMIN D3 Take 2,000 Units by mouth daily.   famotidine 40 MG tablet Commonly known as: PEPCID Take 1  tablet (40 mg total) by mouth 2 (two) times daily.   fluticasone 50 MCG/ACT nasal spray Commonly known as: FLONASE INSTILL 2 SPRAYS IN EACH NOSTRIL ONCE A DAY   fluticasone-salmeterol 250-50 MCG/ACT Aepb Commonly known as: ADVAIR Inhale 2 puffs into the lungs daily.   fluticasone-salmeterol 115-21 MCG/ACT inhaler Commonly known as: ADVAIR HFA INHALE 2 PUFFS BY MOUTH TWICE A DAY (RINSE MOUTH WELL WITH WATER AFTER EACH USE)   losartan 25 MG tablet Commonly known as: COZAAR Take 25 mg by mouth daily.   metoprolol succinate 50 MG 24 hr tablet Commonly known as: TOPROL-XL Take 1 tablet (50 mg total) by mouth daily.   montelukast 10 MG tablet Commonly known as: SINGULAIR Take 1 tablet (10 mg total) by mouth at bedtime.   nitroGLYCERIN 0.4 MG SL tablet Commonly known as: Nitrostat Place 1 tablet (0.4 mg total) under the tongue every 5 (five) minutes as needed.   sertraline 100 MG tablet Commonly known as: ZOLOFT Take 50 mg by mouth every morning.    Past Medical History:  Diagnosis Date   Acid reflux    Asthma    Atypical chest pain    Hypertension    Hypokalemia    Hypopotassemia    Obesity    Recurrent upper respiratory infection (URI)     Past Surgical History:  Procedure Laterality Date   ADENOIDECTOMY     CORONARY/GRAFT ACUTE MI  REVASCULARIZATION N/A 06/19/2021   Procedure: Coronary/Graft Acute MI Revascularization;  Surgeon: Orbie Pyo, MD;  [facility];  Service: Cardiovascular;  Laterality: N/A;   HERNIA REPAIR     LEFT HEART CATH AND CORONARY ANGIOGRAPHY N/A 06/19/2021   Procedure: LEFT HEART CATH AND CORONARY ANGIOGRAPHY;  Surgeon: Orbie Pyo, MD;  [facility];  Service: Cardiovascular;  Laterality: N/A;    Review of systems negative except as noted in HPI / PMHx or noted below:  Review of Systems  Constitutional: Negative.   HENT: Negative.    Eyes: Negative.   Respiratory: Negative.    Cardiovascular:  Negative.   Gastrointestinal: Negative.   Genitourinary: Negative.   Musculoskeletal: Negative.   Skin: Negative.   Neurological: Negative.   Endo/Heme/Allergies: Negative.   Psychiatric/Behavioral: Negative.      Objective:   Vitals:   08/02/21 1343  BP: 102/62  Pulse: 64  Resp: 16  Temp: 98 F (36.7 C)  SpO2: 96%   Height: 5\' 7"  (170.2 cm)  Weight: 257 lb 6.4 oz (116.8 kg)   Physical Exam Constitutional:      Appearance: He is not diaphoretic.  HENT:     Head: Normocephalic.     Right Ear: Tympanic membrane, ear canal and external ear normal.     Left Ear: Tympanic membrane, ear canal and external ear normal.     Nose: Nose normal. No mucosal edema or rhinorrhea.     Mouth/Throat:     Pharynx: Uvula midline. No oropharyngeal exudate.  Eyes:     Conjunctiva/sclera: Conjunctivae normal.  Neck:     Thyroid: No thyromegaly.     Trachea: Trachea normal. No tracheal tenderness or tracheal deviation.  Cardiovascular:     Rate and Rhythm: Normal rate and regular rhythm.     Heart sounds: Normal heart sounds, S1 normal and S2 normal. No murmur heard. Pulmonary:     Effort: No respiratory distress.     Breath sounds: Normal breath sounds. No stridor. No wheezing or rales.  Lymphadenopathy:     Head:     Right side of head: No tonsillar adenopathy.     Left side of head: No tonsillar adenopathy.     Cervical: No cervical adenopathy.  Skin:    Findings: No erythema or rash.     Nails: There is no clubbing.  Neurological:     Mental Status: He is alert.    Diagnostics:    Spirometry was performed and demonstrated an FEV1 of 2.51 at 79 % of predicted.  Assessment and Plan:   1. Not well controlled moderate persistent asthma   2. Other allergic rhinitis   3. Gastroesophageal reflux disease, unspecified whether esophagitis present   4. Obstructive sleep apnea syndrome     1.  Consistently use famotidine 40 mg - 1 tablet twice a day  2.  Consistently use WIXELA  250 - 1 inhalations twice a day (empty lungs)  3.  Consistently use montelukast 10 mg - 1 tablet once a day  4.  If needed:   A.  Albuterol HFA -2 inhalations every 4-6 hours  B.  Mucinex DM-2 tablets twice a day  5.  Return to clinic in 12 weeks or earlier if problem  6. Use your CPAP at night  appears to be doing pretty good at this point while using his combination inhaler at full dose and using famotidine at 40 mg twice a day along with a leukotriene  modifier to address both respiratory tract inflammation and his reflux.  Assuming he does well with this plan I will see him back in this clinic in 12 weeks or earlier if there is a problem.  I did encourage him to use his CPAP machine for his currently untreated sleep apnea.  Laurette Schimke, MD Allergy / Immunology [facility]

## 2021-08-02 NOTE — Patient Instructions (Addendum)
°  1.  Consistently use famotidine 40 mg - 1 tablet twice a day  2.  Consistently use WIXELA 250 - 1 inhalations twice a day (empty lungs)  3.  Consistently use montelukast 10 mg - 1 tablet once a day  4.  If needed:   A.  Albuterol HFA -2 inhalations every 4-6 hours  B.  Mucinex DM-2 tablets twice a day  5.  Return to clinic in 12 weeks or earlier if problem  6. Use your CPAP at night

## 2021-08-03 ENCOUNTER — Encounter: Payer: Self-pay | Admitting: Allergy and Immunology

## 2021-09-06 NOTE — Progress Notes (Signed)
Cardiology Office Note:    Date:  09/07/2021   ID:  Harold Warner, DOB 10-Dec-1960, MRN [id number]  PCP:  Anson FretJones, Christopher, MD  Cardiologist:  Lesleigh NoeHenry W Odelle Kosier III, MD   Referring MD: Anson FretJones, Christopher, MD   Chief Complaint  Patient presents with   Coronary Artery Disease   Hypertension   Follow-up    Obesity    History of Present Illness:    Harold LogeCharles W Easom is a 61 y.o. male with a hx of HTN, OSA on CPAP, HLD, prediabetes A1C 6.1,obesity who presented with STEMI 06/19/21 complicated cardiac arrest treated with 1 round of CPR, defib treated with DES LAD plan for DAPT x 1 yr. HS Troponin >24,000, f/u echo LVEF 55-60%.   He had cardiac arrest in the waiting area at the [facility] emergency room.  We successfully resuscitated him and took him straight to the Cath Lab where on June 19, 2021 he underwent PCI of the LAD with placement of stent.  No neurological damage.  He is now back at work for the [employer].  He is tolerating physical activity okay.  He is still desirous of participating in phase 2 cardiac rehab.  He has not needed sublingual nitroglycerin.  He had lost significant weight but is gained some back in the interval since his MI.  Past Medical History:  Diagnosis Date   Acid reflux    Asthma    Atypical chest pain    Hypertension    Hypokalemia    Hypopotassemia    Obesity    Recurrent upper respiratory infection (URI)     Past Surgical History:  Procedure Laterality Date   ADENOIDECTOMY     CORONARY/GRAFT ACUTE MI REVASCULARIZATION N/A 06/19/2021   Procedure: Coronary/Graft Acute MI Revascularization;  Surgeon: Orbie Pyohukkani, Arun K, MD;  [facility];  Service: Cardiovascular;  Laterality: N/A;   HERNIA REPAIR     LEFT HEART CATH AND CORONARY ANGIOGRAPHY N/A 06/19/2021   Procedure: LEFT HEART CATH AND CORONARY ANGIOGRAPHY;  Surgeon: Orbie Pyohukkani, Arun K, MD;  [facility];  Service: Cardiovascular;  Laterality: N/A;     Current Medications: Current Meds  Medication Sig   albuterol (PROVENTIL) (2.5 MG/3ML) 0.083% nebulizer solution INHALE 1 VIAL IN NEBULIZER BY MOUTH EVERY 6 HOURS FOR ASTHMA, COUGH, BREATHING, ALLERGY, USE WITH NEBULIZER MACHINE WHEN AT  HOME UNTIL COUGH RESOLVES THEN DECREASE DOSES DURING THE DAY AND EVENING;  CONTINUE TO DO A NEBULIZER TREATMENT BEFORE BEDTIME DURING SPRING AND  FALL ALLERGY SEASON FOR ASTHMA, COUGH, BREATHING, ALLERGY, USE WITH NEBULIZER MACHINE WHEN AT   HOME UNTIL COUGH RESOLVES THEN DECREASE DOSES DURING THE DAY AND EVENING;   CONTINUE TO DO A NEBULIZER TREATMENT BEFORE BEDTIME DURING SPRING AND   FALL ALLERGY SEASON   albuterol (VENTOLIN HFA) 108 (90 Base) MCG/ACT inhaler Take 2 puffs every 4-6 hours if needed   amitriptyline (ELAVIL) 25 MG tablet Take 1 tablet by mouth at bedtime.   aspirin EC 81 MG tablet Take 81 mg by mouth daily.   atorvastatin (LIPITOR) 80 MG tablet Take 1 tablet (80 mg total) by mouth daily.   busPIRone (BUSPAR) 10 MG tablet Take 10 mg by mouth daily.   cetirizine (ZYRTEC) 10 MG tablet Take 1 tablet (10 mg total) by mouth 2 (two) times daily as needed for allergies (Can use an extra dose during flares).   Cholecalciferol 50 MCG (2000 UT) TABS Take 1 tablet by mouth daily.   Dextromethorphan-guaiFENesin [employer](MUCINEX  DM) 30-600 MG TB12 Take 2 tablets by mouth 2 (two) times daily as needed (Use if needed du).   famotidine (PEPCID) 40 MG tablet Take 1 tablet (40 mg total) by mouth 2 (two) times daily.   fluticasone (FLONASE) 50 MCG/ACT nasal spray INSTILL 2 SPRAYS IN EACH NOSTRIL ONCE A DAY   fluticasone-salmeterol (ADVAIR HFA) 115-21 MCG/ACT inhaler INHALE 2 PUFFS BY MOUTH TWICE A DAY (RINSE MOUTH WELL WITH WATER AFTER EACH USE)   fluticasone-salmeterol (ADVAIR) 250-50 MCG/ACT AEPB Inhale 2 puffs into the lungs daily.   losartan (COZAAR) 25 MG tablet Take 25 mg by mouth daily.   metoprolol succinate (TOPROL-XL) 50 MG 24 hr tablet Take 1 tablet (50 mg  total) by mouth daily.   montelukast (SINGULAIR) 10 MG tablet Take 1 tablet (10 mg total) by mouth at bedtime.   nitroGLYCERIN (NITROSTAT) 0.4 MG SL tablet Place 1 tablet (0.4 mg total) under the tongue every 5 (five) minutes as needed.   sertraline (ZOLOFT) 100 MG tablet Take 50 mg by mouth every morning.   ticagrelor (BRILINTA) 90 MG TABS tablet Take 1 tablet (90 mg total) by mouth 2 (two) times daily.   [DISCONTINUED] cholecalciferol (VITAMIN D3) 25 MCG (1000 UNIT) tablet Take 2,000 Units by mouth daily.     Allergies:   Adhesive [tape]   Social History   Socioeconomic History   Marital status: Unknown    Spouse name: Not on file   Number of children: Not on file   Years of education: Not on file   Highest education level: Not on file  Occupational History   Not on file  Tobacco Use   Smoking status: Never   Smokeless tobacco: Never  Substance and Sexual Activity   Alcohol use: No   Drug use: No   Sexual activity: Not Currently  Other Topics Concern   Not on file  Social History Narrative   ** Merged History Encounter **       ** Merged History Encounter **       Social Determinants of Health   Financial Resource Strain: Not on file  Food Insecurity: Not on file  Transportation Needs: Not on file  Physical Activity: Not on file  Stress: Not on file  Social Connections: Not on file     Family History: The patient's family history includes Hypertension in his mother. There is no history of Allergic rhinitis, Asthma, Eczema, Urticaria, or Angioedema.  ROS:   Please see the history of present illness.    He had COVID-19 in January 2022.  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  He is not being limited.  He is back to full duty.  All other systems reviewed and are negative.  EKGs/Labs/Other Studies Reviewed:    The following studies were reviewed today:  2 D ECHOCARDIOGRAM 06/20/2021: IMPRESSIONS     1. Left ventricular  ejection fraction, by estimation, is 55 to 60%. The  left ventricle has normal function. Left ventricular endocardial border  not optimally defined to evaluate regional wall motion. Left ventricular  diastolic function could not be  evaluated.   2. Right ventricular systolic function was not well visualized. The right  ventricular size is not well visualized.   3. The mitral valve was not well visualized. No evidence of mitral valve  regurgitation. No evidence of mitral stenosis.   4. The aortic valve was not assessed. Aortic valve regurgitation is not  visualized. No  aortic stenosis is present.Essentiall non diagnostic study  due to poor windows and morbid obesity. u   EKG:  EKG did not repeat his EKG today  Recent Labs: 06/19/2021: ALT 34 06/22/2021: BUN 13; Creatinine, Ser 1.19; Hemoglobin 15.2; Magnesium 2.0; Platelets 268; Potassium 3.7; Sodium 136  Recent Lipid Panel    Component Value Date/Time   CHOL 192 06/19/2021 2138   TRIG 240 (H) 06/19/2021 2138   HDL 42 06/19/2021 2138   CHOLHDL 4.6 06/19/2021 2138   VLDL 48 (H) 06/19/2021 2138   LDLCALC 102 (H) 06/19/2021 2138    Physical Exam:    VS:  BP 116/60    Pulse 61    Ht 5\' 7"  (1.702 m)    Wt 255 lb (115.7 kg)    SpO2 97%    BMI 39.94 kg/m     Wt Readings from Last 3 Encounters:  09/07/21 255 lb (115.7 kg)  08/02/21 257 lb 6.4 oz (116.8 kg)  07/19/21 259 lb (117.5 kg)     GEN: Morbid obesity. No acute distress HEENT: Normal NECK: No JVD. LYMPHATICS: No lymphadenopathy CARDIAC: No murmur. RRR no gallop, or edema. VASCULAR:  Normal Pulses. No bruits. RESPIRATORY:  Clear to auscultation without rales, wheezing or rhonchi  ABDOMEN: Soft, non-tender, non-distended, No pulsatile mass, MUSCULOSKELETAL: No deformity  SKIN: Warm and dry NEUROLOGIC:  Alert and oriented x 3 PSYCHIATRIC:  Normal affect   ASSESSMENT:    1. Coronary artery disease involving native coronary artery of native heart without angina pectoris    2. Essential hypertension   3. Hyperlipidemia, unspecified hyperlipidemia type   4. Obstructive sleep apnea   5. Prediabetes    PLAN:    In order of problems listed above:  Secondary prevention reviewed.  Continue dual antiplatelet therapy.  In October 23 consider changing to Plavix 75 mg/day and dropping aspirin.  Brilinta will be discontinued and October 23.  We will participate in phase 2 cardiac rehab.  Has gotten the community care plan through the October 25 approved. Target blood pressure 130/80 mmHg.  Continue Toprol-XL, Cozaar, and salt restriction.  Needs to lose weight.  Compliance with CPAP is encouraged. We need to get most recent lipid panel done at the [facility]. Compliance with CPAP is urged Consider SGLT2 therapy if glycemic control is poor.   Clinical follow-up in 6 to 7 months.  Plan ALT antiplatelet regimen and October 2023.   Overall education and awareness concerning secondary risk prevention was discussed in detail: LDL less than 70, hemoglobin A1c less than 7, blood pressure target less than 130/80 mmHg, >150 minutes of moderate aerobic activity per week, avoidance of smoking, weight control (via diet and exercise), and continued surveillance/management of/for obstructive sleep apnea.    Medication Adjustments/Labs and Tests Ordered: Current medicines are reviewed at length with the patient today.  Concerns regarding medicines are outlined above.  No orders of the defined types were placed in this encounter.  No orders of the defined types were placed in this encounter.   Patient Instructions  Medication Instructions:  Your physician recommends that you continue on your current medications as directed. Please refer to the Current Medication list given to you today.  *If you need a refill on your cardiac medications before your next appointment, please call your pharmacy*   Lab Work: none If you have labs (blood work) drawn today and your tests are  completely normal, you will receive your results only by: MyChart Message (if you have MyChart) OR  A paper copy in the mail If you have any lab test that is abnormal or we need to change your treatment, we will call you to review the results.   Testing/Procedures: none   Follow-Up: At [facility], you and your health needs are our priority.  As part of our continuing mission to provide you with exceptional heart care, we have created designated Provider Care Teams.  These Care Teams include your primary Cardiologist (physician) and Advanced Practice Providers (APPs -  Physician Assistants and Nurse Practitioners) who all work together to provide you with the care you need, when you need it.  We recommend signing up for the patient portal called "MyChart".  Sign up information is provided on this After Visit Summary.  MyChart is used to connect with patients for Virtual Visits (Telemedicine).  Patients are able to view lab/test results, encounter notes, upcoming appointments, etc.  Non-urgent messages can be sent to your provider as well.   To learn more about what you can do with MyChart, go to [email or web address].    Your next appointment:   6 month(s)  The format for your next appointment:   In Person  Provider:   Lesleigh Noe, MD     Other Instructions     Signed, Lesleigh Noe, MD  09/07/2021 3:27 PM    [facility]

## 2021-09-07 ENCOUNTER — Other Ambulatory Visit: Payer: Self-pay

## 2021-09-07 ENCOUNTER — Ambulatory Visit (INDEPENDENT_AMBULATORY_CARE_PROVIDER_SITE_OTHER): Payer: No Typology Code available for payment source | Admitting: Interventional Cardiology

## 2021-09-07 ENCOUNTER — Encounter: Payer: Self-pay | Admitting: Interventional Cardiology

## 2021-09-07 VITALS — BP 116/60 | HR 61 | Ht 67.0 in | Wt 255.0 lb

## 2021-09-07 DIAGNOSIS — I2510 Atherosclerotic heart disease of native coronary artery without angina pectoris: Secondary | ICD-10-CM | POA: Diagnosis not present

## 2021-09-07 DIAGNOSIS — I10 Essential (primary) hypertension: Secondary | ICD-10-CM

## 2021-09-07 DIAGNOSIS — G4733 Obstructive sleep apnea (adult) (pediatric): Secondary | ICD-10-CM | POA: Diagnosis not present

## 2021-09-07 DIAGNOSIS — E785 Hyperlipidemia, unspecified: Secondary | ICD-10-CM | POA: Diagnosis not present

## 2021-09-07 DIAGNOSIS — R7303 Prediabetes: Secondary | ICD-10-CM

## 2021-09-07 NOTE — Patient Instructions (Signed)
Medication Instructions:  Your physician recommends that you continue on your current medications as directed. Please refer to the Current Medication list given to you today.  *If you need a refill on your cardiac medications before your next appointment, please call your pharmacy*   Lab Work: none If you have labs (blood work) drawn today and your tests are completely normal, you will receive your results only by: MyChart Message (if you have MyChart) OR A paper copy in the mail If you have any lab test that is abnormal or we need to change your treatment, we will call you to review the results.   Testing/Procedures: none   Follow-Up: At [facility], you and your health needs are our priority.  As part of our continuing mission to provide you with exceptional heart care, we have created designated Provider Care Teams.  These Care Teams include your primary Cardiologist (physician) and Advanced Practice Providers (APPs -  Physician Assistants and Nurse Practitioners) who all work together to provide you with the care you need, when you need it.  We recommend signing up for the patient portal called "MyChart".  Sign up information is provided on this After Visit Summary.  MyChart is used to connect with patients for Virtual Visits (Telemedicine).  Patients are able to view lab/test results, encounter notes, upcoming appointments, etc.  Non-urgent messages can be sent to your provider as well.   To learn more about what you can do with MyChart, go to [email or web address].    Your next appointment:   6 month(s)  The format for your next appointment:   In Person  Provider:   Lesleigh Noe, MD     Other Instructions

## 2021-09-16 ENCOUNTER — Telehealth (HOSPITAL_COMMUNITY): Payer: Self-pay

## 2021-09-16 NOTE — Telephone Encounter (Signed)
Pt called in regards to CR, adv pt we have not recv'ed [facility] auth. He stated he contact the [place] 3 times and he will call again.

## 2021-09-21 ENCOUNTER — Telehealth (HOSPITAL_COMMUNITY): Payer: Self-pay

## 2021-09-21 NOTE — Telephone Encounter (Signed)
Patient called and was interested in participating in the Cardiac Rehab Program. Patient will come in for orientation on 10/13/2021@1000am  and will attend the 7:00am exercise class.   Handed packet to patient personally

## 2021-10-10 ENCOUNTER — Telehealth (HOSPITAL_COMMUNITY): Payer: Self-pay | Admitting: *Deleted

## 2021-10-13 ENCOUNTER — Other Ambulatory Visit: Payer: Self-pay

## 2021-10-13 ENCOUNTER — Encounter (HOSPITAL_COMMUNITY): Payer: Self-pay

## 2021-10-13 ENCOUNTER — Encounter (HOSPITAL_COMMUNITY)
Admission: RE | Admit: 2021-10-13 | Discharge: 2021-10-13 | Disposition: A | Discharge status: Home / Self Care | Payer: No Typology Code available for payment source | Source: Ambulatory Visit | Attending: Cardiology | Admitting: Cardiology

## 2021-10-13 VITALS — BP 118/68 | HR 75 | Ht 67.75 in | Wt 257.3 lb

## 2021-10-13 DIAGNOSIS — I2102 ST elevation (STEMI) myocardial infarction involving left anterior descending coronary artery: Secondary | ICD-10-CM

## 2021-10-13 DIAGNOSIS — Z955 Presence of coronary angioplasty implant and graft: Secondary | ICD-10-CM

## 2021-10-13 DIAGNOSIS — Z48812 Encounter for surgical aftercare following surgery on the circulatory system: Secondary | ICD-10-CM | POA: Insufficient documentation

## 2021-10-13 HISTORY — DX: Atherosclerotic heart disease of native coronary artery without angina pectoris: I25.10

## 2021-10-13 HISTORY — DX: Hyperlipidemia, unspecified: E78.5

## 2021-10-13 NOTE — Progress Notes (Addendum)
Cardiac Individual Treatment Plan  Patient Details  Name: Harold Warner MRN: [id number] Date of Birth: Dec 23, 1960 Referring Provider:   Flowsheet Row CARDIAC REHAB PHASE II ORIENTATION from 10/13/2021 in [facility] CARDIAC REHAB  Referring Provider Dr Anson Fret MD, Lenn Sink, Dr Armanda Magic MD, Covering       Initial Encounter Date:  Flowsheet Row CARDIAC REHAB PHASE II ORIENTATION from 10/13/2021 in [facility] CARDIAC REHAB  Date 10/13/21       Visit Diagnosis: 06/19/21 STEMI  06/19/21 S/P DES LAD  Patient's Home Medications on Admission:  Current Outpatient Medications:    albuterol (PROVENTIL) (2.5 MG/3ML) 0.083% nebulizer solution, INHALE 1 VIAL IN NEBULIZER BY MOUTH EVERY 6 HOURS FOR ASTHMA, COUGH, BREATHING, ALLERGY, USE WITH NEBULIZER MACHINE WHEN AT  HOME UNTIL COUGH RESOLVES THEN DECREASE DOSES DURING THE DAY AND EVENING;  CONTINUE TO DO A NEBULIZER TREATMENT BEFORE BEDTIME DURING SPRING AND  FALL ALLERGY SEASON FOR ASTHMA, COUGH, BREATHING, ALLERGY, USE WITH NEBULIZER MACHINE WHEN AT   HOME UNTIL COUGH RESOLVES THEN DECREASE DOSES DURING THE DAY AND EVENING;   CONTINUE TO DO A NEBULIZER TREATMENT BEFORE BEDTIME DURING [place] AND   FALL ALLERGY SEASON, Disp: , Rfl:    albuterol (VENTOLIN HFA) 108 (90 Base) MCG/ACT inhaler, Take 2 puffs every 4-6 hours if needed (Patient taking differently: Inhale 2 puffs into the lungs every 4 (four) hours as needed for wheezing or shortness of breath.), Disp: 18 g, Rfl: 2   amitriptyline (ELAVIL) 25 MG tablet, Take 12.5 tablets by mouth at bedtime., Disp: , Rfl:    aspirin EC 81 MG tablet, Take 81 mg by mouth daily., Disp: , Rfl:    atorvastatin (LIPITOR) 80 MG tablet, Take 1 tablet (80 mg total) by mouth daily., Disp: 90 tablet, Rfl: 0   busPIRone (BUSPAR) 10 MG tablet, Take 10 mg by mouth daily., Disp: , Rfl:    cetirizine (ZYRTEC) 10 MG tablet, Take 1 tablet (10 mg total) by mouth 2  (two) times daily as needed for allergies (Can use an extra dose during flares)., Disp: 60 tablet, Rfl: 5   Cholecalciferol 50 MCG (2000 UT) TABS, Take 2,000 Units by mouth daily., Disp: , Rfl:    famotidine (PEPCID) 40 MG tablet, Take 1 tablet (40 mg total) by mouth 2 (two) times daily., Disp: 60 tablet, Rfl: 5   fluticasone-salmeterol (ADVAIR) 250-50 MCG/ACT AEPB, Inhale 1 puff into the lungs at bedtime., Disp: , Rfl:    losartan (COZAAR) 25 MG tablet, Take 25 mg by mouth daily., Disp: , Rfl:    Melatonin 5 MG CHEW, Chew 5 mg by mouth at bedtime as needed (Sleep)., Disp: , Rfl:    metoprolol succinate (TOPROL-XL) 100 MG 24 hr tablet, Take 50 mg by mouth in the morning and at bedtime. Take with or immediately following a meal., Disp: , Rfl:    montelukast (SINGULAIR) 10 MG tablet, Take 1 tablet (10 mg total) by mouth at bedtime., Disp: 30 tablet, Rfl: 5   nitroGLYCERIN (NITROSTAT) 0.4 MG SL tablet, Place 1 tablet (0.4 mg total) under the tongue every 5 (five) minutes as needed., Disp: 25 tablet, Rfl: 2   sertraline (ZOLOFT) 100 MG tablet, Take 50 mg by mouth every morning., Disp: , Rfl:    ticagrelor (BRILINTA) 90 MG TABS tablet, Take 1 tablet (90 mg total) by mouth 2 (two) times daily., Disp: 180 tablet, Rfl: 0   Dextromethorphan-guaiFENesin (MUCINEX DM) 30-600 MG TB12, Take 2 tablets  by mouth 2 (two) times daily as needed (Use if needed du). (Patient not taking: Reported on 10/12/2021), Disp: 60 tablet, Rfl: 1  Past Medical History: Past Medical History:  Diagnosis Date   Acid reflux    Asthma    Atypical chest pain    Coronary artery disease    Hyperlipidemia    Hypertension    Hypokalemia    Hypopotassemia    Obesity    Recurrent upper respiratory infection (URI)     Tobacco Use: Social History   Tobacco Use  Smoking Status Never  Smokeless Tobacco Never    Labs: Recent Review Flowsheet Data     Labs for ITP Cardiac and Pulmonary Rehab Latest Ref Rng & Units 10/17/2012  06/19/2021   Cholestrol 0 - 200 mg/dL - 161   LDLCALC 0 - 99 mg/dL - 096(E)   HDL >45 mg/dL - 42   Trlycerides <409 mg/dL - 811(B)   Hemoglobin J4N 4.8 - 5.6 % - 6.1(H)   TCO2 0 - 100 mmol/L 28 -       Capillary Blood Glucose: Lab Results  Component Value Date   GLUCAP 177 (H) 06/19/2021     Exercise Target Goals: Exercise Program Goal: Individual exercise prescription set using results from initial 6 min walk test and THRR while considering  patients activity barriers and safety.   Exercise Prescription Goal: Starting with aerobic activity 30 plus minutes a day, 3 days per week for initial exercise prescription. Provide home exercise prescription and guidelines that participant acknowledges understanding prior to discharge.  Activity Barriers & Risk Stratification:  Activity Barriers & Cardiac Risk Stratification - 10/13/21 1033       Activity Barriers & Cardiac Risk Stratification   Activity Barriers Arthritis;Balance Concerns;Deconditioning    Cardiac Risk Stratification High             6 Minute Walk:  6 Minute Walk     Row Name 10/13/21 1032         6 Minute Walk   Phase Initial     Distance 1023 feet     Walk Time 6 minutes     # of Rest Breaks 0     MPH 1.94     METS 1.94     RPE 11     Perceived Dyspnea  0     VO2 Peak 6.78     Symptoms No     Resting HR 75 bpm     Resting BP 118/68     Resting Oxygen Saturation  95 %     Exercise Oxygen Saturation  during 6 min walk 97 %     Max Ex. HR 90 bpm     Max Ex. BP 112/58     2 Minute Post BP 116/60              Oxygen Initial Assessment:   Oxygen Re-Evaluation:   Oxygen Discharge (Final Oxygen Re-Evaluation):   Initial Exercise Prescription:  Initial Exercise Prescription - 10/13/21 1000       Date of Initial Exercise RX and Referring Provider   Date 10/13/21    Referring Provider Dr Anson Fret MD, Lenn Sink, Dr Armanda Magic MD, Covering    Expected Discharge Date  12/09/21      NuStep   Level 2    SPM 80    Minutes 15    METs 1.9      Track   Laps 10    Minutes 15  METs 2.16      Prescription Details   Frequency (times per week) 3    Duration Progress to 30 minutes of continuous aerobic without signs/symptoms of physical distress      Intensity   THRR 40-80% of Max Heartrate 63-126    Ratings of Perceived Exertion 11-13    Perceived Dyspnea 0-4      Progression   Progression Continue progressive overload as per policy without signs/symptoms or physical distress.      Resistance Training   Training Prescription Yes    Weight 3    Reps 10-15             Perform Capillary Blood Glucose checks as needed.  Exercise Prescription Changes:   Exercise Comments:   Exercise Goals and Review:   Exercise Goals     Row Name 10/13/21 1036             Exercise Goals   Increase Physical Activity Yes       Intervention Provide advice, education, support and counseling about physical activity/exercise needs.;Develop an individualized exercise prescription for aerobic and resistive training based on initial evaluation findings, risk stratification, comorbidities and participant's personal goals.       Expected Outcomes Short Term: Attend rehab on a regular basis to increase amount of physical activity.;Long Term: Add in home exercise to make exercise part of routine and to increase amount of physical activity.;Long Term: Exercising regularly at least 3-5 days a week.       Increase Strength and Stamina Yes       Intervention Provide advice, education, support and counseling about physical activity/exercise needs.;Develop an individualized exercise prescription for aerobic and resistive training based on initial evaluation findings, risk stratification, comorbidities and participant's personal goals.       Expected Outcomes Short Term: Increase workloads from initial exercise prescription for resistance, speed, and METs.;Short Term:  Perform resistance training exercises routinely during rehab and add in resistance training at home;Long Term: Improve cardiorespiratory fitness, muscular endurance and strength as measured by increased METs and functional capacity ( )       Able to understand and use rate of perceived exertion (RPE) scale Yes       Intervention Provide education and explanation on how to use RPE scale       Expected Outcomes Short Term: Able to use RPE daily in rehab to express subjective intensity level;Long Term:  Able to use RPE to guide intensity level when exercising independently       Knowledge and understanding of Target Heart Rate Range (THRR) Yes       Intervention Provide education and explanation of THRR including how the numbers were predicted and where they are located for reference       Expected Outcomes Short Term: Able to state/look up THRR;Long Term: Able to use THRR to govern intensity when exercising independently;Short Term: Able to use daily as guideline for intensity in rehab       Understanding of Exercise Prescription Yes       Intervention Provide education, explanation, and written materials on patient's individual exercise prescription       Expected Outcomes Short Term: Able to explain program exercise prescription;Long Term: Able to explain home exercise prescription to exercise independently                Exercise Goals Re-Evaluation :    Discharge Exercise Prescription (Final Exercise Prescription Changes):   Nutrition:  Target Goals: Understanding of nutrition guidelines,  daily intake of sodium 1500mg , cholesterol 200mg , calories 30% from fat and 7% or less from saturated fats, daily to have 5 or more servings of fruits and vegetables.  Biometrics:  Pre Biometrics - 10/13/21 1037       Pre Biometrics   Waist Circumference 52 inches    Hip Circumference 46.25 inches    Waist to Hip Ratio 1.12 %    Triceps Skinfold 8 mm    % Body Fat 35 %    Grip Strength 50  kg    Flexibility 10.5 in    Single Leg Stand 10.4 seconds              Nutrition Therapy Plan and Nutrition Goals:   Nutrition Assessments:  MEDIFICTS Score Key: ?70 Need to make dietary changes  40-70 Heart Healthy Diet ? 40 Therapeutic Level Cholesterol Diet   Picture Your Plate Scores: <86 Unhealthy dietary pattern with much room for improvement. 41-50 Dietary pattern unlikely to meet recommendations for good health and room for improvement. 51-60 More healthful dietary pattern, with some room for improvement.  >60 Healthy dietary pattern, although there may be some specific behaviors that could be improved.    Nutrition Goals Re-Evaluation:   Nutrition Goals Discharge (Final Nutrition Goals Re-Evaluation):   Psychosocial: Target Goals: Acknowledge presence or absence of significant depression and/or stress, maximize coping skills, provide positive support system. Participant is able to verbalize types and ability to use techniques and skills needed for reducing stress and depression.  Initial Review & Psychosocial Screening:  Initial Psych Review & Screening - 10/13/21 0837       Initial Review   Current issues with History of Depression;Current Anxiety/Panic;Current Depression      Family Dynamics   Good Support System? Yes   Charllie lives with his mother who he has for support     Barriers   Psychosocial barriers to participate in program The patient should benefit from training in stress management and relaxation.      Screening Interventions   Interventions Encouraged to exercise;To provide support and resources with identified psychosocial needs    Expected Outcomes Long Term Goal: Stressors or current issues are controlled or eliminated.;Short Term goal: Identification and review with participant of any Quality of Life or Depression concerns found by scoring the questionnaire.             Quality of Life Scores:  Quality of Life - 10/13/21  0921       Quality of Life   Select Quality of Life      Quality of Life Scores   Health/Function Pre 27.8 %    Socioeconomic Pre 27.38 %    Psych/Spiritual Pre 30 %    Family Pre 28 %    GLOBAL Pre 27.17 %            Scores of 19 and below usually indicate a poorer quality of life in these areas.  A difference of  2-3 points is a clinically meaningful difference.  A difference of 2-3 points in the total score of the Quality of Life Index has been associated with significant improvement in overall quality of life, self-image, physical symptoms, and general health in studies assessing change in quality of life.  PHQ-9: Recent Review Flowsheet Data     Depression screen [facility] 2/9 10/13/2021   Decreased Interest 0   Down, Depressed, Hopeless 0   PHQ - 2 Score 0      Interpretation of Total Score  Total Score Depression Severity:  1-4 = Minimal depression, 5-9 = Mild depression, 10-14 = Moderate depression, 15-19 = Moderately severe depression, 20-27 = Severe depression   Psychosocial Evaluation and Intervention:   Psychosocial Re-Evaluation:   Psychosocial Discharge (Final Psychosocial Re-Evaluation):   Vocational Rehabilitation: Provide vocational rehab assistance to qualifying candidates.   Vocational Rehab Evaluation & Intervention:  Vocational Rehab - 10/13/21 [id number]       Initial Vocational Rehab Evaluation & Intervention   Assessment shows need for Vocational Rehabilitation No   Billey GoslingCharlie works full time and does not need vocational rehab at this time            Education: Education Goals: Education classes will be provided on a weekly basis, covering required topics. Participant will state understanding/return demonstration of topics presented.  Learning Barriers/Preferences:  Learning Barriers/Preferences - 10/13/21 1040       Learning Barriers/Preferences   Learning Barriers None    Learning Preferences Audio;Computer/Internet;Group  Instruction;Individual Instruction;Pictoral;Skilled Demonstration;Verbal Instruction;Video;Written Material             Education Topics: Hypertension, Hypertension Reduction -Define heart disease and high blood pressure. Discus how high blood pressure affects the body and ways to reduce high blood pressure.   Exercise and Your Heart -Discuss why it is important to exercise, the FITT principles of exercise, normal and abnormal responses to exercise, and how to exercise safely.   Angina -Discuss definition of angina, causes of angina, treatment of angina, and how to decrease risk of having angina.   Cardiac Medications -Review what the following cardiac medications are used for, how they affect the body, and side effects that may occur when taking the medications.  Medications include Aspirin, Beta blockers, calcium channel blockers, ACE Inhibitors, angiotensin receptor blockers, diuretics, digoxin, and antihyperlipidemics.   Congestive Heart Failure -Discuss the definition of CHF, how to live with CHF, the signs and symptoms of CHF, and how keep track of weight and sodium intake.   Heart Disease and Intimacy -Discus the effect sexual activity has on the heart, how changes occur during intimacy as we age, and safety during sexual activity.   Smoking Cessation / COPD -Discuss different methods to quit smoking, the health benefits of quitting smoking, and the definition of COPD.   Nutrition I: Fats -Discuss the types of cholesterol, what cholesterol does to the heart, and how cholesterol levels can be controlled.   Nutrition II: Labels -Discuss the different components of food labels and how to read food label   Heart Parts/Heart Disease and PAD -Discuss the anatomy of the heart, the pathway of blood circulation through the heart, and these are affected by heart disease.   Stress I: Signs and Symptoms -Discuss the causes of stress, how stress may lead to anxiety and  depression, and ways to limit stress.   Stress II: Relaxation -Discuss different types of relaxation techniques to limit stress.   Warning Signs of Stroke / TIA -Discuss definition of a stroke, what the signs and symptoms are of a stroke, and how to identify when someone is having stroke.   Knowledge Questionnaire Score:  Knowledge Questionnaire Score - 10/13/21 0921       Knowledge Questionnaire Score   Pre Score 21/24             Core Components/Risk Factors/Patient Goals at Admission:  Personal Goals and Risk Factors at Admission - 10/13/21 1040       Core Components/Risk Factors/Patient Goals on Admission    Weight Management Yes;Obesity;Weight  Loss    Intervention Weight Management: Develop a combined nutrition and exercise program designed to reach desired caloric intake, while maintaining appropriate intake of nutrient and fiber, sodium and fats, and appropriate energy expenditure required for the weight goal.;Weight Management: Provide education and appropriate resources to help participant work on and attain dietary goals.;Weight Management/Obesity: Establish reasonable short term and long term weight goals.;Obesity: Provide education and appropriate resources to help participant work on and attain dietary goals.    Admit Weight 257 lb 4.4 oz (116.7 kg)    Expected Outcomes Short Term: Continue to assess and modify interventions until short term weight is achieved;Long Term: Adherence to nutrition and physical activity/exercise program aimed toward attainment of established weight goal;Weight Loss: Understanding of general recommendations for a balanced deficit meal plan, which promotes 1-2 lb weight loss per week and includes a negative energy balance of [phone] kcal/d;Understanding recommendations for meals to include 15-35% energy as protein, 25-35% energy from fat, 35-60% energy from carbohydrates, less than 200mg  of dietary cholesterol, 20-35 gm of total fiber  daily;Understanding of distribution of calorie intake throughout the day with the consumption of 4-5 meals/snacks    Hypertension Yes    Intervention Provide education on lifestyle modifcations including regular physical activity/exercise, weight management, moderate sodium restriction and increased consumption of fresh fruit, vegetables, and low fat dairy, alcohol moderation, and smoking cessation.;Monitor prescription use compliance.    Expected Outcomes Short Term: Continued assessment and intervention until BP is < 140/71mm HG in hypertensive participants. < 130/50mm HG in hypertensive participants with diabetes, heart failure or chronic kidney disease.;Long Term: Maintenance of blood pressure at goal levels.    Lipids Yes    Intervention Provide education and support for participant on nutrition & aerobic/resistive exercise along with prescribed medications to achieve LDL 70mg , HDL >40mg .    Expected Outcomes Short Term: Participant states understanding of desired cholesterol values and is compliant with medications prescribed. Participant is following exercise prescription and nutrition guidelines.;Long Term: Cholesterol controlled with medications as prescribed, with individualized exercise RX and with personalized nutrition plan. Value goals: LDL < 70mg , HDL > 40 mg.    Stress Yes    Intervention Offer individual and/or small group education and counseling on adjustment to heart disease, stress management and health-related lifestyle change. Teach and support self-help strategies.;Refer participants experiencing significant psychosocial distress to appropriate mental health specialists for further evaluation and treatment. When possible, include family members and significant others in education/counseling sessions.    Expected Outcomes Short Term: Participant demonstrates changes in health-related behavior, relaxation and other stress management skills, ability to obtain effective social support,  and compliance with psychotropic medications if prescribed.;Long Term: Emotional wellbeing is indicated by absence of clinically significant psychosocial distress or social isolation.    Personal Goal Other Yes    Personal Goal Long and short the same: flexibilty, strength, lose wt, maybe get back into martial arts    Intervention Will continue to monitor pt and progress workloads as tolerated without sign or symptom    Expected Outcomes Pt will achieve his goals and increase exercise tolerance.             Core Components/Risk Factors/Patient Goals Review:    Core Components/Risk Factors/Patient Goals at Discharge (Final Review):    ITP Comments:  ITP Comments     Row Name 10/13/21 0836           ITP Comments Dr MD, Medical Director  Comments: Billey GoslingCharlie attended orientation on 10/13/2021 to review rules and guidelines for program.  Completed 6 minute walk test, Intitial ITP, and exercise prescription.  VSS. Telemetry-Sinus Rhythm.  Asymptomatic. Safety measures and social distancing in place per CDC guidelines. Thayer HeadingsMaria Walden Suhaila Troiano RN BSN

## 2021-10-13 NOTE — Progress Notes (Signed)
Cardiac Rehab Medication Review by a Nurse  Does the patient  feel that his/her medications are working for him/her?  yes  Has the patient been experiencing any side effects to the medications prescribed?  yes  Does the patient measure his/her own blood pressure or blood glucose at home?  no   Does the patient have any problems obtaining medications due to transportation or finances?   no  Understanding of regimen: good Understanding of indications: fair Potential of compliance: good    Nurse comments: Harold Warner is taking his medications as prescribed. Charlie's face is reddened from a cream prescribed by the [place] he going today to have it evaluated. Harold Warner has a BP cuff/ monitor. Harold Warner does not check his blood pressures on a regular basis.    Arta Bruce [facility] RN 10/13/2021 8:39 AM

## 2021-10-17 ENCOUNTER — Encounter (HOSPITAL_COMMUNITY)
Admission: RE | Admit: 2021-10-17 | Discharge: 2021-10-17 | Disposition: A | Discharge status: Home / Self Care | Payer: No Typology Code available for payment source | Source: Ambulatory Visit | Attending: Internal Medicine | Admitting: Internal Medicine

## 2021-10-17 ENCOUNTER — Telehealth: Payer: Self-pay | Admitting: Allergy and Immunology

## 2021-10-17 ENCOUNTER — Other Ambulatory Visit: Payer: Self-pay

## 2021-10-17 DIAGNOSIS — Z955 Presence of coronary angioplasty implant and graft: Secondary | ICD-10-CM | POA: Diagnosis present

## 2021-10-17 DIAGNOSIS — Z48812 Encounter for surgical aftercare following surgery on the circulatory system: Secondary | ICD-10-CM | POA: Diagnosis not present

## 2021-10-17 DIAGNOSIS — I2102 ST elevation (STEMI) myocardial infarction involving left anterior descending coronary artery: Secondary | ICD-10-CM

## 2021-10-17 NOTE — Progress Notes (Signed)
Pt started cardiac rehab today.  Pt tolerated exercise without difficulty. VSS, telemetry-Sinus Rhythm, asymptomatic.  Medication list reconciled at orientation by Gladstone Lighter RN, with no changes reported today. Pt denies barriers to medication compliance.  PSYCHOSOCIAL ASSESSMENT:  PHQ-0. Pt exhibits positive coping skills, hopeful outlook with supportive family. No psychosocial needs identified at this time as he sees a Warden/ranger and psychiatrist at the [place]. In addition he participates in weekly therapy sessions, which he shared have been helpful. Pt oriented to exercise equipment and gym routine. Understanding verbalized.

## 2021-10-17 NOTE — Progress Notes (Signed)
Daily Session Note  Patient Details  Name: Harold Warner MRN: [id number] Date of Birth: 09-11-1960 Referring Provider:   Flowsheet Row CARDIAC REHAB PHASE II ORIENTATION from 10/13/2021 in [place]  Referring Provider Dr Landry Mellow MD, [place]ayer [place], Dr Fransico Him MD, Covering       Encounter Date: 10/17/2021  Check In:  Session Check In - 10/17/21 0710       Check-In   Supervising physician immediately available to respond to emergencies [facility] immediately available    Physician(s) Dr. Eliseo Squires    Staff Present Esmeralda Links BS, ACSM EP-C, Exercise Physiologist;Kaylee Rosana Hoes, MS, ACSM-CEP, Exercise Physiologist;Annedrea Rosezella [place], RN, MHA    Virtual Visit No    Medication changes reported     No    Fall or balance concerns reported    No    Tobacco Cessation No Change    Warm-up and Cool-down Performed as group-led instruction    Resistance Training Performed Yes    VAD Patient? No    PAD/SET Patient? No      Pain Assessment   Currently in Pain? No/denies    Pain Score 0-No pain    Multiple Pain Sites No             Capillary Blood Glucose: No results found for this or any previous visit (from the past 24 hour(s)).   Exercise Prescription Changes - 10/17/21 0830       Response to Exercise   Blood Pressure (Admit) 118/66    Blood Pressure (Exercise) 140/70    Blood Pressure (Exit) 120/68    Heart Rate (Admit) 65 bpm    Heart Rate (Exercise) 94 bpm    Heart Rate (Exit) 76 bpm    Rating of Perceived Exertion (Exercise) 12    Perceived Dyspnea (Exercise) 0    Symptoms 0    Comments Pt first day of exercise    Duration Progress to 30 minutes of  aerobic without signs/symptoms of physical distress    Intensity THRR unchanged      Progression   Progression Continue to progress workloads to maintain intensity without signs/symptoms of physical distress.    Average METs 3.06      Resistance Training    Training Prescription Yes    Weight 3    Reps 10-15    Time 10 Minutes      NuStep   Level 2    SPM 80    Minutes 15    METs 2.8      Track   Laps 10    Minutes 20    METs 3.32             Social History   Tobacco Use  Smoking Status Never  Smokeless Tobacco Never    Goals Met:  Exercise tolerated well No report of concerns or symptoms today Strength training completed today  Goals Unmet:  Not Applicable  Comments: See note by Ramon Dredge RN, MHA   Dr. Fransico Him is Medical Director for Cardiac Rehab at [facility].

## 2021-10-17 NOTE — Telephone Encounter (Signed)
Faxed renewal of authorization request to [facility], [phone] and emailed it to [email or web address]. 

## 2021-10-19 ENCOUNTER — Other Ambulatory Visit: Payer: Self-pay

## 2021-10-19 ENCOUNTER — Encounter (HOSPITAL_COMMUNITY)
Admission: RE | Admit: 2021-10-19 | Discharge: 2021-10-19 | Disposition: A | Discharge status: Home / Self Care | Payer: No Typology Code available for payment source | Source: Ambulatory Visit | Attending: Internal Medicine | Admitting: Internal Medicine

## 2021-10-19 DIAGNOSIS — Z955 Presence of coronary angioplasty implant and graft: Secondary | ICD-10-CM | POA: Diagnosis present

## 2021-10-19 DIAGNOSIS — I2102 ST elevation (STEMI) myocardial infarction involving left anterior descending coronary artery: Secondary | ICD-10-CM | POA: Diagnosis present

## 2021-10-21 ENCOUNTER — Other Ambulatory Visit: Payer: Self-pay

## 2021-10-21 ENCOUNTER — Encounter (HOSPITAL_COMMUNITY)
Admission: RE | Admit: 2021-10-21 | Discharge: 2021-10-21 | Disposition: A | Discharge status: Home / Self Care | Payer: No Typology Code available for payment source | Source: Ambulatory Visit | Attending: Internal Medicine | Admitting: Internal Medicine

## 2021-10-21 DIAGNOSIS — Z955 Presence of coronary angioplasty implant and graft: Secondary | ICD-10-CM

## 2021-10-21 DIAGNOSIS — I2102 ST elevation (STEMI) myocardial infarction involving left anterior descending coronary artery: Secondary | ICD-10-CM

## 2021-10-24 ENCOUNTER — Other Ambulatory Visit: Payer: Self-pay

## 2021-10-24 ENCOUNTER — Encounter (HOSPITAL_COMMUNITY)
Admission: RE | Admit: 2021-10-24 | Discharge: 2021-10-24 | Disposition: A | Discharge status: Home / Self Care | Payer: No Typology Code available for payment source | Source: Ambulatory Visit | Attending: Internal Medicine | Admitting: Internal Medicine

## 2021-10-24 DIAGNOSIS — Z955 Presence of coronary angioplasty implant and graft: Secondary | ICD-10-CM

## 2021-10-24 DIAGNOSIS — I2102 ST elevation (STEMI) myocardial infarction involving left anterior descending coronary artery: Secondary | ICD-10-CM | POA: Diagnosis not present

## 2021-10-25 ENCOUNTER — Ambulatory Visit (INDEPENDENT_AMBULATORY_CARE_PROVIDER_SITE_OTHER): Payer: No Typology Code available for payment source | Admitting: Allergy and Immunology

## 2021-10-25 DIAGNOSIS — G4733 Obstructive sleep apnea (adult) (pediatric): Secondary | ICD-10-CM

## 2021-10-25 DIAGNOSIS — J3089 Other allergic rhinitis: Secondary | ICD-10-CM

## 2021-10-25 DIAGNOSIS — K219 Gastro-esophageal reflux disease without esophagitis: Secondary | ICD-10-CM | POA: Diagnosis not present

## 2021-10-25 DIAGNOSIS — J4540 Moderate persistent asthma, uncomplicated: Secondary | ICD-10-CM | POA: Diagnosis not present

## 2021-10-25 MED ORDER — CETIRIZINE HCL 10 MG PO TABS: 10.0000 mg | ORAL_TABLET | Freq: Two times a day (BID) | ORAL | 5 refills | Status: DC | PRN

## 2021-10-25 MED ORDER — ALBUTEROL SULFATE HFA 108 (90 BASE) MCG/ACT IN AERS: 2.0000 | INHALATION_SPRAY | RESPIRATORY_TRACT | 1 refills | Status: DC | PRN

## 2021-10-25 MED ORDER — FAMOTIDINE 40 MG PO TABS: 40.0000 mg | ORAL_TABLET | Freq: Two times a day (BID) | ORAL | 5 refills | Status: DC

## 2021-10-25 MED ORDER — MUCINEX DM 30-600 MG PO TB12: 2.0000 | ORAL_TABLET | Freq: Two times a day (BID) | ORAL | 1 refills | Status: DC | PRN

## 2021-10-25 MED ORDER — ALBUTEROL SULFATE (2.5 MG/3ML) 0.083% IN NEBU: 2.5000 mg | INHALATION_SOLUTION | RESPIRATORY_TRACT | 1 refills | Status: DC | PRN

## 2021-10-25 MED ORDER — MONTELUKAST SODIUM 10 MG PO TABS: 10.0000 mg | ORAL_TABLET | Freq: Every day | ORAL | 5 refills | Status: DC

## 2021-10-25 MED ORDER — FLUTICASONE-SALMETEROL 250-50 MCG/ACT IN AEPB: 1.0000 | INHALATION_SPRAY | Freq: Two times a day (BID) | RESPIRATORY_TRACT | 5 refills | Status: DC

## 2021-10-25 NOTE — Patient Instructions (Addendum)
?  1.  Continue famotidine 40 mg - 1 tablet twice a day ? ?2.  Continue WIXELA 250 - 1 inhalations twice a day (empty lungs) ? ?3.  Continue montelukast 10 mg - 1 tablet once a day ? ?4.  If needed: ? ? A.  Albuterol HFA -2 inhalations every 4-6 hours ? B.  Mucinex DM-2 tablets twice a day ? C.  Antihistamine ? D.  Flonase - 1-2 sprays each nostril 1 time per day ? ?5.  Return to clinic in 6 months or earlier if problem ? ?  ? ? ?

## 2021-10-25 NOTE — Progress Notes (Signed)
? ?[place] ? ? ?Follow-up Note ? ?Referring Provider: Landry Mellow, MD ?Primary Provider: Landry Mellow, MD ?Date of Office Visit: 10/25/2021 ? ?Subjective:  ? ?Harold Warner (DOB: 23-Feb-1961) is a 61 y.o. male who returns to the [facility] on 10/25/2021 in re-evaluation of the following: ? ?HPI: Harold Warner returns to this clinic in reevaluation of asthma, allergic rhinitis, reflux, and sleep apnea.  I last saw him in this clinic on 02 August 2021. ? ?He has really done very well since his last visit regarding his asthma.  His requirement for short acting bronchodilator is less than 1 time per week.  He has not required a systemic steroid to treat an exacerbation.  He continues to use his combination inhaler only 1 time per day. ? ?He had very little problems with his upper airways while using montelukast.  He has not had to use any Flonase.  He has not required an antibiotic to treat a episode of sinusitis. ? ?He has had very little problems with his reflux on his current plan. ? ?He has been consistently using his CPAP machine at nighttime. ? ?Allergies as of 10/25/2021   ? ?   Reactions  ? Adhesive [tape] Rash  ? ?  ? ?  ?Medication List  ? ? ?albuterol (2.5 MG/3ML) 0.083% nebulizer solution ?Commonly known as: PROVENTIL ?INHALE 1 VIAL IN NEBULIZER BY MOUTH EVERY 6 HOURS FOR ASTHMA,  ?  ?albuterol 108 (90 Base) MCG/ACT inhaler ?Commonly known as: VENTOLIN HFA ?Take 2 puffs every 4-6 hours if needed ?  ?amitriptyline 25 MG tablet ?Commonly known as: ELAVIL ?Take 12.5 tablets by mouth at bedtime. ?  ?aspirin EC 81 MG tablet ?Take 81 mg by mouth daily. ?  ?atorvastatin 80 MG tablet ?Commonly known as: LIPITOR ?Take 1 tablet (80 mg total) by mouth daily. ?  ?Brilinta 90 MG Tabs tablet ?Generic drug: ticagrelor ?Take 1 tablet (90 mg total) by mouth 2 (two) times daily. ?  ?busPIRone 10 MG tablet ?Commonly known as: BUSPAR ?Take 10 mg by mouth  daily. ?  ?cetirizine 10 MG tablet ?Commonly known as: ZYRTEC ?Take 1 tablet (10 mg total) by mouth 2 (two) times daily as needed for allergies (Can use an extra dose during flares). ?  ?Cholecalciferol 50 MCG (2000 UT) Tabs ?Take 2,000 Units by mouth daily. ?  ?famotidine 40 MG tablet ?Commonly known as: PEPCID ?Take 1 tablet (40 mg total) by mouth 2 (two) times daily. ?  ?losartan 25 MG tablet ?Commonly known as: COZAAR ?Take 25 mg by mouth daily. ?  ?Melatonin 5 MG Chew ?Chew 5 mg by mouth at bedtime as needed (Sleep). ?  ?metoprolol succinate 100 MG 24 hr tablet ?Commonly known as: TOPROL-XL ?Take 50 mg by mouth in the morning and at bedtime. Take with or immediately following a meal. ?  ?montelukast 10 MG tablet ?Commonly known as: SINGULAIR ?Take 1 tablet (10 mg total) by mouth at bedtime. ?  ?sertraline 100 MG tablet ?Commonly known as: ZOLOFT ?Take 50 mg by mouth every morning. ?  ?Wixela Inhub 250-50 MCG/ACT Aepb ?Generic drug: fluticasone-salmeterol ?Inhale 1 puff into the lungs in the morning and at bedtime. ?  ? ?Past Medical History:  ?Diagnosis Date  ? Acid reflux   ? Asthma   ? Atypical chest pain   ? Coronary artery disease   ? Hyperlipidemia   ? Hypertension   ? Hypokalemia   ? Hypopotassemia   ?  Obesity   ? Recurrent upper respiratory infection (URI)   ? ? ?Past Surgical History:  ?Procedure Laterality Date  ? ADENOIDECTOMY    ? CARDIAC CATHETERIZATION    ? CORONARY/GRAFT ACUTE MI REVASCULARIZATION N/A 06/19/2021  ? Procedure: Coronary/Graft Acute MI Revascularization;  Surgeon: Early Osmond, MD;  Location: [facility];  Service: Cardiovascular;  Laterality: N/A;  ? HERNIA REPAIR    ? LEFT HEART CATH AND CORONARY ANGIOGRAPHY N/A 06/19/2021  ? Procedure: LEFT HEART CATH AND CORONARY ANGIOGRAPHY;  Surgeon: Early Osmond, MD;  Location: [facility];  Service: Cardiovascular;  Laterality: N/A;  ? ? ?Review of systems negative except as noted in HPI / PMHx or noted below: ? ?Review  of Systems  ?Constitutional: Negative.   ?HENT: Negative.    ?Eyes: Negative.   ?Respiratory: Negative.    ?Cardiovascular: Negative.   ?Gastrointestinal: Negative.   ?Genitourinary: Negative.   ?Musculoskeletal: Negative.   ?Skin: Negative.   ?Neurological: Negative.   ?Endo/Heme/Allergies: Negative.   ?Psychiatric/Behavioral: Negative.    ? ? ?Objective:  ? ? ?Physical Exam ?Constitutional:   ?   Appearance: He is not diaphoretic.  ?HENT:  ?   Head: Normocephalic.  ?   Right Ear: Tympanic membrane, ear canal and external ear normal.  ?   Left Ear: Tympanic membrane, ear canal and external ear normal.  ?   Nose: Nose normal. No mucosal edema or rhinorrhea.  ?   Mouth/Throat:  ?   Pharynx: Uvula midline. No oropharyngeal exudate.  ?Eyes:  ?   Conjunctiva/sclera: Conjunctivae normal.  ?Neck:  ?   Thyroid: No thyromegaly.  ?   Trachea: Trachea normal. No tracheal tenderness or tracheal deviation.  ?Cardiovascular:  ?   Rate and Rhythm: Normal rate and regular rhythm.  ?   Heart sounds: Normal heart sounds, S1 normal and S2 normal. No murmur heard. ?Pulmonary:  ?   Effort: No respiratory distress.  ?   Breath sounds: Normal breath sounds. No stridor. No wheezing or rales.  ?Lymphadenopathy:  ?   Head:  ?   Right side of head: No tonsillar adenopathy.  ?   Left side of head: No tonsillar adenopathy.  ?   Cervical: No cervical adenopathy.  ?Skin: ?   Findings: No erythema or rash.  ?   Nails: There is no clubbing.  ?Neurological:  ?   Mental Status: He is alert.  ? ? ?Diagnostics:  ?  ?Spirometry was performed and demonstrated an FEV1 of 2.63 at 83 % of predicted. ? ?The patient had an Asthma Control Test with the following results: ACT Total Score: 24.   ? ?Assessment and Plan:  ? ?1. Asthma, moderate persistent, well-controlled   ?2. Other allergic rhinitis   ?3. Gastroesophageal reflux disease, unspecified whether esophagitis present   ?4. Obstructive sleep apnea syndrome   ? ? ?1.  Continue famotidine 40 mg - 1  tablet twice a day ? ?2.  Continue WIXELA 250 - 1 inhalations twice a day (empty lungs) ? ?3.  Continue montelukast 10 mg - 1 tablet once a day ? ?4.  If needed: ? ? A.  Albuterol HFA -2 inhalations every 4-6 hours ? B.  Mucinex DM-2 tablets twice a day ? C.  Antihistamine ? D.  Flonase - 1-2 sprays each nostril 1 time per day ? ?5.  Return to clinic in 6 months or earlier if problem ? ?Harold Warner appears to be doing pretty well on his current plan  of treating inflammation of his airway and reflux and he will continue to utilize a collection of anti-inflammatory agents for his airway and an H2 receptor blocker on a consistent basis and he will also continue to treat his sleep apnea with consistent use of his CPAP. I will see him back in this clinic in 6 months or earlier if there is a problem.   ? ?Allena Katz, MD ?Allergy / Immunology ?[place] ?

## 2021-10-26 ENCOUNTER — Encounter: Payer: Self-pay | Admitting: Allergy and Immunology

## 2021-10-26 ENCOUNTER — Other Ambulatory Visit: Payer: Self-pay

## 2021-10-26 ENCOUNTER — Encounter (HOSPITAL_COMMUNITY)
Admission: RE | Admit: 2021-10-26 | Discharge: 2021-10-26 | Disposition: A | Discharge status: Home / Self Care | Payer: No Typology Code available for payment source | Source: Ambulatory Visit | Attending: Internal Medicine | Admitting: Internal Medicine

## 2021-10-26 DIAGNOSIS — I2102 ST elevation (STEMI) myocardial infarction involving left anterior descending coronary artery: Secondary | ICD-10-CM

## 2021-10-26 DIAGNOSIS — Z955 Presence of coronary angioplasty implant and graft: Secondary | ICD-10-CM

## 2021-10-28 ENCOUNTER — Encounter (HOSPITAL_COMMUNITY): Payer: No Typology Code available for payment source

## 2021-10-28 ENCOUNTER — Telehealth (HOSPITAL_COMMUNITY): Payer: Self-pay | Admitting: *Deleted

## 2021-10-31 ENCOUNTER — Other Ambulatory Visit: Payer: Self-pay

## 2021-10-31 ENCOUNTER — Encounter (HOSPITAL_COMMUNITY)
Admission: RE | Admit: 2021-10-31 | Discharge: 2021-10-31 | Disposition: A | Discharge status: Home / Self Care | Payer: No Typology Code available for payment source | Source: Ambulatory Visit | Attending: Internal Medicine | Admitting: Internal Medicine

## 2021-10-31 DIAGNOSIS — Z955 Presence of coronary angioplasty implant and graft: Secondary | ICD-10-CM

## 2021-10-31 DIAGNOSIS — I2102 ST elevation (STEMI) myocardial infarction involving left anterior descending coronary artery: Secondary | ICD-10-CM

## 2021-11-01 NOTE — Telephone Encounter (Signed)
Received fax from [facility] from Nurse Platte, "Harold Warner is being seen at the [facility] for this category of care. Thank you."  ? ?Called and asked to speak to pt to advise him of situation, his mom answered & let me know he would call our office back.  ?

## 2021-11-01 NOTE — Progress Notes (Signed)
Cardiac Individual Treatment Plan ? ?Patient Details  ?Name: Harold Warner ?MRN: [id number] ?Date of Birth: [birth year] ?Referring Provider:   ?Flowsheet Row CARDIAC REHAB PHASE II ORIENTATION from 10/13/2021 in [place]  ?Referring Provider Dr Landry Mellow MD, Thayer [place], Dr Fransico Him MD, Covering  ? ?  ? ? ?Initial Encounter Date:  ?Flowsheet Row CARDIAC REHAB PHASE II ORIENTATION from 10/13/2021 in [place]  ?Date 10/13/21  ? ?  ? ? ?Visit Diagnosis: 06/19/21 STEMI ? ?06/19/21 S/P DES LAD ? ?Patient's Home Medications on Admission: ? ?Current Outpatient Medications:  ?  albuterol (PROVENTIL) (2.5 MG/3ML) 0.083% nebulizer solution, Take 3 mLs (2.5 mg total) by nebulization every 4 (four) hours as needed for wheezing or shortness of breath., Disp: 150 mL, Rfl: 1 ?  albuterol (VENTOLIN HFA) 108 (90 Base) MCG/ACT inhaler, Inhale 2 puffs into the lungs every 4 (four) hours as needed for wheezing or shortness of breath. Take 2 puffs every 4-6 hours if needed, Disp: 18 g, Rfl: 1 ?  amitriptyline (ELAVIL) 25 MG tablet, Take 12.5 tablets by mouth at bedtime., Disp: , Rfl:  ?  aspirin EC 81 MG tablet, Take 81 mg by mouth daily., Disp: , Rfl:  ?  atorvastatin (LIPITOR) 80 MG tablet, Take 1 tablet (80 mg total) by mouth daily., Disp: 90 tablet, Rfl: 0 ?  busPIRone (BUSPAR) 10 MG tablet, Take 10 mg by mouth daily., Disp: , Rfl:  ?  cetirizine (ZYRTEC) 10 MG tablet, Take 1 tablet (10 mg total) by mouth 2 (two) times daily as needed for allergies (Can use an extra dose during flares)., Disp: 60 tablet, Rfl: 5 ?  Cholecalciferol 50 MCG (2000 UT) TABS, Take 2,000 Units by mouth daily., Disp: , Rfl:  ?  Dextromethorphan-guaiFENesin (MUCINEX DM) 30-600 MG TB12, Take 2 tablets by mouth 2 (two) times daily as needed (Use if needed during flare ups.)., Disp: 60 tablet, Rfl: 1 ?  famotidine (PEPCID) 40 MG tablet, Take 1 tablet (40 mg total) by mouth 2  (two) times daily., Disp: 60 tablet, Rfl: 5 ?  fluticasone-salmeterol (WIXELA INHUB) 250-50 MCG/ACT AEPB, Inhale 1 puff into the lungs in the morning and at bedtime., Disp: 60 each, Rfl: 5 ?  losartan (COZAAR) 25 MG tablet, Take 25 mg by mouth daily., Disp: , Rfl:  ?  Melatonin 5 MG CHEW, Chew 5 mg by mouth at bedtime as needed (Sleep)., Disp: , Rfl:  ?  metoprolol succinate (TOPROL-XL) 100 MG 24 hr tablet, Take 50 mg by mouth in the morning and at bedtime. Take with or immediately following a meal., Disp: , Rfl:  ?  montelukast (SINGULAIR) 10 MG tablet, Take 1 tablet (10 mg total) by mouth at bedtime., Disp: 30 tablet, Rfl: 5 ?  sertraline (ZOLOFT) 100 MG tablet, Take 50 mg by mouth every morning., Disp: , Rfl:  ?  ticagrelor (BRILINTA) 90 MG TABS tablet, Take 1 tablet (90 mg total) by mouth 2 (two) times daily., Disp: 180 tablet, Rfl: 0 ? ?Past Medical History: ?Past Medical History:  ?Diagnosis Date  ? Acid reflux   ? Asthma   ? Atypical chest pain   ? Coronary artery disease   ? Hyperlipidemia   ? Hypertension   ? Hypokalemia   ? Hypopotassemia   ? Obesity   ? Recurrent upper respiratory infection (URI)   ? ? ?Tobacco Use: ?Social History  ? ?Tobacco Use  ?Smoking Status Never  ?Smokeless Tobacco Never  ? ? ?  Labs: ?Recent Review Flowsheet Data   ? ? Labs for ITP Cardiac and Pulmonary Rehab Latest Ref Rng & Units 10/17/2012 06/19/2021  ? Cholestrol 0 - 200 mg/dL - 192  ? LDLCALC 0 - 99 mg/dL - 102(H)  ? HDL >40 mg/dL - 42  ? Trlycerides <150 mg/dL - 240(H)  ? Hemoglobin A1c 4.8 - 5.6 % - 6.1(H)  ? TCO2 0 - 100 mmol/L 28 -  ? ?  ? ? ?Capillary Blood Glucose: ?Lab Results  ?Component Value Date  ? GLUCAP 177 (H) 06/19/2021  ? ? ? ?Exercise Target Goals: ?Exercise Program Goal: ?Individual exercise prescription set using results from initial 6 min walk test and THRR while considering  patient?s activity barriers and safety.  ? ?Exercise Prescription Goal: ?Starting with aerobic activity 30 plus minutes a day, 3 days  per week for initial exercise prescription. Provide home exercise prescription and guidelines that participant acknowledges understanding prior to discharge. ? ?Activity Barriers & Risk Stratification: ? Activity Barriers & Cardiac Risk Stratification - 10/13/21 1033   ? ?  ? Activity Barriers & Cardiac Risk Stratification  ? Activity Barriers Arthritis;Balance Concerns;Deconditioning   ? Cardiac Risk Stratification High   ? ?  ?  ? ?  ? ? ?6 Minute Walk: ? 6 Minute Walk   ? ? [place] Name 10/13/21 1032  ?  ?  ?  ? 6 Minute Walk  ? Phase Initial    ? Distance 1023 feet    ? Walk Time 6 minutes    ? # of Rest Breaks 0    ? MPH 1.94    ? METS 1.94    ? RPE 11    ? Perceived Dyspnea  0    ? VO2 Peak 6.78    ? Symptoms No    ? Resting HR 75 bpm    ? Resting BP 118/68    ? Resting Oxygen Saturation  95 %    ? Exercise Oxygen Saturation  during 6 min walk 97 %    ? Max Ex. HR 90 bpm    ? Max Ex. BP 112/58    ? 2 Minute Post BP 116/60    ? ?  ?  ? ?  ? ? ?Oxygen Initial Assessment: ? ? ?Oxygen Re-Evaluation: ? ? ?Oxygen Discharge (Final Oxygen Re-Evaluation): ? ? ?Initial Exercise Prescription: ? Initial Exercise Prescription - 10/13/21 1000   ? ?  ? Date of Initial Exercise RX and Referring Provider  ? Date 10/13/21   ? Referring Provider Dr Landry Mellow MD, Thayer [place], Dr Fransico Him MD, Covering   ? Expected Discharge Date 12/09/21   ?  ? NuStep  ? Level 2   ? SPM 80   ? Minutes 15   ? METs 1.9   ?  ? Track  ? Laps 10   ? Minutes 15   ? METs 2.16   ?  ? Prescription Details  ? Frequency (times per week) 3   ? Duration Progress to 30 minutes of continuous aerobic without signs/symptoms of physical distress   ?  ? Intensity  ? THRR 40-80% of Max Heartrate 63-126   ? Ratings of Perceived Exertion 11-13   ? Perceived Dyspnea 0-4   ?  ? Progression  ? Progression Continue progressive overload as per policy without signs/symptoms or physical distress.   ?  ? Resistance Training  ? Training Prescription Yes   ? Weight 3    ? Reps 10-15   ? ?  ?  ? ?  ? ? ?  Perform Capillary Blood Glucose checks as needed. ? ?Exercise Prescription Changes: ? ? Exercise Prescription Changes   ? ? [place] Name 10/17/21 0830  ?  ?  ?  ?  ?  ? Response to Exercise  ? Blood Pressure (Admit) 118/66      ? Blood Pressure (Exercise) 140/70      ? Blood Pressure (Exit) 120/68      ? Heart Rate (Admit) 65 bpm      ? Heart Rate (Exercise) 94 bpm      ? Heart Rate (Exit) 76 bpm      ? Rating of Perceived Exertion (Exercise) 12      ? Perceived Dyspnea (Exercise) 0      ? Symptoms 0      ? Comments Pt first day of exercise      ? Duration Progress to 30 minutes of  aerobic without signs/symptoms of physical distress      ? Intensity THRR unchanged      ?  ? Progression  ? Progression Continue to progress workloads to maintain intensity without signs/symptoms of physical distress.      ? Average METs 3.06      ?  ? Resistance Training  ? Training Prescription Yes      ? Weight 3      ? Reps 10-15      ? Time 10 Minutes      ?  ? NuStep  ? Level 2      ? SPM 80      ? Minutes 15      ? METs 2.8      ?  ? Track  ? Laps 10      ? Minutes 20      ? METs 3.32      ? ?  ?  ? ?  ? ? ?Exercise Comments: ? ? Exercise Comments   ? ? [place] Name 10/17/21 0830  ?  ?  ?  ?  ? Exercise Comments Pt's first day in the CRP2 program. Pt tolerated exercise well and had an average MET level of 3.06. Pt is learning his THRR, RPE and ExRx. Off to a good start      ? ?  ?  ? ?  ? ? ?Exercise Goals and Review: ? ? Exercise Goals   ? ? [place] Name 10/13/21 1036  ?  ?  ?  ?  ?  ? Exercise Goals  ? Increase Physical Activity Yes      ? Intervention Provide advice, education, support and counseling about physical activity/exercise needs.;Develop an individualized exercise prescription for aerobic and resistive training based on initial evaluation findings, risk stratification, comorbidities and participant's personal goals.      ? Expected Outcomes Short Term: Attend rehab on a regular basis to  increase amount of physical activity.;Long Term: Add in home exercise to make exercise part of routine and to increase amount of physical activity.;Long Term: Exercising regularly at least 3-5 days a week.

## 2021-11-02 ENCOUNTER — Encounter (HOSPITAL_COMMUNITY)
Admission: RE | Admit: 2021-11-02 | Discharge: 2021-11-02 | Disposition: A | Discharge status: Home / Self Care | Payer: No Typology Code available for payment source | Source: Ambulatory Visit | Attending: Internal Medicine | Admitting: Internal Medicine

## 2021-11-02 ENCOUNTER — Other Ambulatory Visit: Payer: Self-pay

## 2021-11-02 DIAGNOSIS — I2102 ST elevation (STEMI) myocardial infarction involving left anterior descending coronary artery: Secondary | ICD-10-CM

## 2021-11-02 DIAGNOSIS — Z955 Presence of coronary angioplasty implant and graft: Secondary | ICD-10-CM

## 2021-11-03 NOTE — Telephone Encounter (Signed)
Spoke with patient and patient stated he would try to reach out to the [place] to see if they will authorize further appointments.  Patient states if they do not authorizes future appointments, he would like to keep his appointment and continue to see Dr. Neldon Mc and he would pay out of pocket if he has to, but did not want to stop seeing Dr. Neldon Mc.   ?

## 2021-11-04 ENCOUNTER — Other Ambulatory Visit: Payer: Self-pay

## 2021-11-04 ENCOUNTER — Encounter (HOSPITAL_COMMUNITY)
Admission: RE | Admit: 2021-11-04 | Discharge: 2021-11-04 | Disposition: A | Discharge status: Home / Self Care | Payer: No Typology Code available for payment source | Source: Ambulatory Visit | Attending: Internal Medicine | Admitting: Internal Medicine

## 2021-11-04 ENCOUNTER — Telehealth (HOSPITAL_COMMUNITY): Payer: Self-pay | Admitting: *Deleted

## 2021-11-04 DIAGNOSIS — I2102 ST elevation (STEMI) myocardial infarction involving left anterior descending coronary artery: Secondary | ICD-10-CM | POA: Diagnosis not present

## 2021-11-04 DIAGNOSIS — Z955 Presence of coronary angioplasty implant and graft: Secondary | ICD-10-CM

## 2021-11-04 NOTE — Progress Notes (Signed)
Pt in this morning for cardiac rehab exercise.  Pt reported to CR staff that he completed follow up on this past Wednesday 3/15 with the allergy clinic at the [place].  Pt mentioned to the [place] staff that he noticed when ambulating on the track during CR he would have some tingling to his left arm particularly when he would push himself to walk faster. Rates this about a 1-2 scale.  This was unreported to CR staff at the time it occurred.  12 lead ekg was performed per pt no further action or intervention warranted.  Pt averages 22-24 laps in at 15 minute period with pushing on weight bearing activities.  Pt denies any complaint while on the nustep nor with any other activity outside of CR.  Cautioned pt for today cut back his pace so that we can determine threshold for this complaint.  Pt ambulated 22 laps(200 feet approx_ in 15 minute period and barely noticed any tingling less than 1 on the pain scale. Bp 128/68. Continue to monitor and will alert Dr. Katrinka Blazing. Alanson Aly, BSN ?Cardiac and Pulmonary Rehab Nurse Navigator  ? ?

## 2021-11-04 NOTE — Telephone Encounter (Signed)
-----   Message from Belva Crome, MD sent at 11/04/2021 10:10 AM EDT ----- ?Thanks. No action needed. ?----- Message ----- ?From: Rowe Pavy, RN ?Sent: 11/04/2021   8:00 AM EDT ?To: Belva Crome, MD ? ?FYI ? ?

## 2021-11-07 ENCOUNTER — Other Ambulatory Visit: Payer: Self-pay

## 2021-11-07 ENCOUNTER — Encounter (HOSPITAL_COMMUNITY)
Admission: RE | Admit: 2021-11-07 | Discharge: 2021-11-07 | Disposition: A | Discharge status: Home / Self Care | Payer: No Typology Code available for payment source | Source: Ambulatory Visit | Attending: Internal Medicine | Admitting: Internal Medicine

## 2021-11-07 DIAGNOSIS — I2102 ST elevation (STEMI) myocardial infarction involving left anterior descending coronary artery: Secondary | ICD-10-CM

## 2021-11-07 DIAGNOSIS — Z955 Presence of coronary angioplasty implant and graft: Secondary | ICD-10-CM

## 2021-11-07 NOTE — Progress Notes (Signed)
CARDIAC REHAB PHASE 2 ? ?Reviewed home exercise with pt today. Pt is tolerating exercise well. Pt will continue to exercise on their own by walking for 30-45 minutes per session 2-4 days a week in addition to the 3 days in CRP2. Advised pt on THRR, RPE scale, hydration and temperature/humidity precautions. Reinforced NTG use, S/S to stop exercise and when to call MD vs 911. Encouraged warm up cool down and stretches with exercise sessions. Pt verbalized understanding, all questions were answered and pt was given a copy to take home.  ?  ?Harrie Jeans ACSM-CEP ?11/07/2021 ?11:48 AM ? ?

## 2021-11-09 ENCOUNTER — Encounter (HOSPITAL_COMMUNITY)
Admission: RE | Admit: 2021-11-09 | Discharge: 2021-11-09 | Disposition: A | Discharge status: Home / Self Care | Payer: No Typology Code available for payment source | Source: Ambulatory Visit | Attending: Internal Medicine | Admitting: Internal Medicine

## 2021-11-09 ENCOUNTER — Other Ambulatory Visit: Payer: Self-pay

## 2021-11-09 DIAGNOSIS — I2102 ST elevation (STEMI) myocardial infarction involving left anterior descending coronary artery: Secondary | ICD-10-CM | POA: Diagnosis not present

## 2021-11-09 DIAGNOSIS — Z955 Presence of coronary angioplasty implant and graft: Secondary | ICD-10-CM

## 2021-11-11 ENCOUNTER — Telehealth (HOSPITAL_COMMUNITY): Payer: Self-pay | Admitting: *Deleted

## 2021-11-11 ENCOUNTER — Encounter (HOSPITAL_COMMUNITY): Payer: No Typology Code available for payment source

## 2021-11-11 NOTE — Telephone Encounter (Signed)
Patient left message on department voicemail stating that he didn't sleep well last night and woke up vomiting this morning. Patient will be absent from cardiac rehab today. Requested call back to reschedule. Message forwarded to patient's nurse case manger. ?

## 2021-11-14 ENCOUNTER — Encounter (HOSPITAL_COMMUNITY)
Admission: RE | Admit: 2021-11-14 | Discharge: 2021-11-14 | Disposition: A | Discharge status: Home / Self Care | Payer: No Typology Code available for payment source | Source: Ambulatory Visit | Attending: Internal Medicine | Admitting: Internal Medicine

## 2021-11-14 ENCOUNTER — Other Ambulatory Visit: Payer: Self-pay

## 2021-11-14 DIAGNOSIS — I2102 ST elevation (STEMI) myocardial infarction involving left anterior descending coronary artery: Secondary | ICD-10-CM

## 2021-11-14 DIAGNOSIS — Z955 Presence of coronary angioplasty implant and graft: Secondary | ICD-10-CM

## 2021-11-16 ENCOUNTER — Encounter (HOSPITAL_COMMUNITY)
Admission: RE | Admit: 2021-11-16 | Discharge: 2021-11-16 | Disposition: A | Discharge status: Home / Self Care | Payer: No Typology Code available for payment source | Source: Ambulatory Visit | Attending: Internal Medicine | Admitting: Internal Medicine

## 2021-11-16 ENCOUNTER — Other Ambulatory Visit: Payer: Self-pay

## 2021-11-16 DIAGNOSIS — I2102 ST elevation (STEMI) myocardial infarction involving left anterior descending coronary artery: Secondary | ICD-10-CM

## 2021-11-16 DIAGNOSIS — Z955 Presence of coronary angioplasty implant and graft: Secondary | ICD-10-CM

## 2021-11-18 ENCOUNTER — Encounter (HOSPITAL_COMMUNITY)
Admission: RE | Admit: 2021-11-18 | Discharge: 2021-11-18 | Disposition: A | Discharge status: Home / Self Care | Payer: No Typology Code available for payment source | Source: Ambulatory Visit | Attending: Internal Medicine | Admitting: Internal Medicine

## 2021-11-18 DIAGNOSIS — Z955 Presence of coronary angioplasty implant and graft: Secondary | ICD-10-CM

## 2021-11-18 DIAGNOSIS — I2102 ST elevation (STEMI) myocardial infarction involving left anterior descending coronary artery: Secondary | ICD-10-CM

## 2021-11-21 ENCOUNTER — Encounter (HOSPITAL_COMMUNITY)
Admission: RE | Admit: 2021-11-21 | Discharge: 2021-11-21 | Disposition: A | Discharge status: Home / Self Care | Payer: No Typology Code available for payment source | Source: Ambulatory Visit | Attending: Internal Medicine | Admitting: Internal Medicine

## 2021-11-21 DIAGNOSIS — I252 Old myocardial infarction: Secondary | ICD-10-CM | POA: Insufficient documentation

## 2021-11-21 DIAGNOSIS — I2102 ST elevation (STEMI) myocardial infarction involving left anterior descending coronary artery: Secondary | ICD-10-CM | POA: Diagnosis present

## 2021-11-21 DIAGNOSIS — Z955 Presence of coronary angioplasty implant and graft: Secondary | ICD-10-CM | POA: Insufficient documentation

## 2021-11-21 DIAGNOSIS — Z48812 Encounter for surgical aftercare following surgery on the circulatory system: Secondary | ICD-10-CM | POA: Insufficient documentation

## 2021-11-23 ENCOUNTER — Encounter (HOSPITAL_COMMUNITY)
Admission: RE | Admit: 2021-11-23 | Discharge: 2021-11-23 | Disposition: A | Discharge status: Home / Self Care | Payer: No Typology Code available for payment source | Source: Ambulatory Visit | Attending: Internal Medicine | Admitting: Internal Medicine

## 2021-11-23 DIAGNOSIS — Z48812 Encounter for surgical aftercare following surgery on the circulatory system: Secondary | ICD-10-CM | POA: Diagnosis not present

## 2021-11-23 DIAGNOSIS — Z955 Presence of coronary angioplasty implant and graft: Secondary | ICD-10-CM

## 2021-11-23 DIAGNOSIS — I2102 ST elevation (STEMI) myocardial infarction involving left anterior descending coronary artery: Secondary | ICD-10-CM

## 2021-11-25 ENCOUNTER — Encounter (HOSPITAL_COMMUNITY)
Admission: RE | Admit: 2021-11-25 | Discharge: 2021-11-25 | Disposition: A | Discharge status: Home / Self Care | Payer: No Typology Code available for payment source | Source: Ambulatory Visit | Attending: Internal Medicine | Admitting: Internal Medicine

## 2021-11-25 DIAGNOSIS — I2102 ST elevation (STEMI) myocardial infarction involving left anterior descending coronary artery: Secondary | ICD-10-CM

## 2021-11-25 DIAGNOSIS — Z955 Presence of coronary angioplasty implant and graft: Secondary | ICD-10-CM

## 2021-11-25 DIAGNOSIS — Z48812 Encounter for surgical aftercare following surgery on the circulatory system: Secondary | ICD-10-CM | POA: Diagnosis not present

## 2021-11-28 ENCOUNTER — Telehealth (HOSPITAL_COMMUNITY): Payer: Self-pay | Admitting: Family Medicine

## 2021-11-28 ENCOUNTER — Encounter (HOSPITAL_COMMUNITY): Payer: No Typology Code available for payment source

## 2021-11-29 NOTE — Progress Notes (Signed)
Cardiac Individual Treatment Plan ? ?Patient Details  ?Name: Harold Warner ?MRN: [id number] ?Date of Birth: 1960-12-05 ?Referring Provider:   ?Flowsheet Row CARDIAC REHAB PHASE II ORIENTATION from 10/13/2021 in [place]  ?Referring Provider Dr Landry Mellow MD, Thayer [place], Dr Fransico Him MD, Covering  ? ?  ? ? ?Initial Encounter Date:  ?Flowsheet Row CARDIAC REHAB PHASE II ORIENTATION from 10/13/2021 in [place]  ?Date 10/13/21  ? ?  ? ? ?Visit Diagnosis: 06/19/21 STEMI ? ?06/19/21 S/P DES LAD ? ?Patient's Home Medications on Admission: ? ?Current Outpatient Medications:  ?  albuterol (PROVENTIL) (2.5 MG/3ML) 0.083% nebulizer solution, Take 3 mLs (2.5 mg total) by nebulization every 4 (four) hours as needed for wheezing or shortness of breath., Disp: 150 mL, Rfl: 1 ?  albuterol (VENTOLIN HFA) 108 (90 Base) MCG/ACT inhaler, Inhale 2 puffs into the lungs every 4 (four) hours as needed for wheezing or shortness of breath. Take 2 puffs every 4-6 hours if needed, Disp: 18 g, Rfl: 1 ?  amitriptyline (ELAVIL) 25 MG tablet, Take 12.5 tablets by mouth at bedtime., Disp: , Rfl:  ?  aspirin EC 81 MG tablet, Take 81 mg by mouth daily., Disp: , Rfl:  ?  atorvastatin (LIPITOR) 80 MG tablet, Take 1 tablet (80 mg total) by mouth daily., Disp: 90 tablet, Rfl: 0 ?  busPIRone (BUSPAR) 10 MG tablet, Take 10 mg by mouth daily., Disp: , Rfl:  ?  cetirizine (ZYRTEC) 10 MG tablet, Take 1 tablet (10 mg total) by mouth 2 (two) times daily as needed for allergies (Can use an extra dose during flares)., Disp: 60 tablet, Rfl: 5 ?  Cholecalciferol 50 MCG (2000 UT) TABS, Take 2,000 Units by mouth daily., Disp: , Rfl:  ?  Dextromethorphan-guaiFENesin (MUCINEX DM) 30-600 MG TB12, Take 2 tablets by mouth 2 (two) times daily as needed (Use if needed during flare ups.)., Disp: 60 tablet, Rfl: 1 ?  famotidine (PEPCID) 40 MG tablet, Take 1 tablet (40 mg total) by mouth 2  (two) times daily., Disp: 60 tablet, Rfl: 5 ?  fluticasone-salmeterol (WIXELA INHUB) 250-50 MCG/ACT AEPB, Inhale 1 puff into the lungs in the morning and at bedtime., Disp: 60 each, Rfl: 5 ?  losartan (COZAAR) 25 MG tablet, Take 25 mg by mouth daily., Disp: , Rfl:  ?  Melatonin 5 MG CHEW, Chew 5 mg by mouth at bedtime as needed (Sleep)., Disp: , Rfl:  ?  metoprolol succinate (TOPROL-XL) 100 MG 24 hr tablet, Take 50 mg by mouth in the morning and at bedtime. Take with or immediately following a meal., Disp: , Rfl:  ?  montelukast (SINGULAIR) 10 MG tablet, Take 1 tablet (10 mg total) by mouth at bedtime., Disp: 30 tablet, Rfl: 5 ?  sertraline (ZOLOFT) 100 MG tablet, Take 50 mg by mouth every morning., Disp: , Rfl:  ?  ticagrelor (BRILINTA) 90 MG TABS tablet, Take 1 tablet (90 mg total) by mouth 2 (two) times daily., Disp: 180 tablet, Rfl: 0 ? ?Past Medical History: ?Past Medical History:  ?Diagnosis Date  ? Acid reflux   ? Asthma   ? Atypical chest pain   ? Coronary artery disease   ? Hyperlipidemia   ? Hypertension   ? Hypokalemia   ? Hypopotassemia   ? Obesity   ? Recurrent upper respiratory infection (URI)   ? ? ?Tobacco Use: ?Social History  ? ?Tobacco Use  ?Smoking Status Never  ?Smokeless Tobacco Never  ? ? ?  Labs: ?Review Flowsheet   ? ?  ?  Latest Ref Rng & Units 10/17/2012 06/19/2021  ?Labs for ITP Cardiac and Pulmonary Rehab  ?Cholestrol 0 - 200 mg/dL  192    ?LDL (calc) 0 - 99 mg/dL  102    ?HDL-C >40 mg/dL  42    ?Trlycerides <150 mg/dL  240    ?Hemoglobin A1c 4.8 - 5.6 %  6.1    ?TCO2 0 - 100 mmol/L 28     ?  ? ? Multiple values from one day are sorted in reverse-chronological order  ?  ?  ? ? ?Capillary Blood Glucose: ?Lab Results  ?Component Value Date  ? GLUCAP 177 (H) 06/19/2021  ? ? ? ?Exercise Target Goals: ?Exercise Program Goal: ?Individual exercise prescription set using results from initial 6 min walk test and THRR while considering  patient?s activity barriers and safety.  ? ?Exercise  Prescription Goal: ?Initial exercise prescription builds to 30-45 minutes a day of aerobic activity, 2-3 days per week.  Home exercise guidelines will be given to patient during program as part of exercise prescription that the participant will acknowledge. ? ?Activity Barriers & Risk Stratification: ? Activity Barriers & Cardiac Risk Stratification - 10/13/21 1033   ? ?  ? Activity Barriers & Cardiac Risk Stratification  ? Activity Barriers Arthritis;Balance Concerns;Deconditioning   ? Cardiac Risk Stratification High   ? ?  ?  ? ?  ? ? ?6 Minute Walk: ? 6 Minute Walk   ? ? [place] Name 10/13/21 1032  ?  ?  ?  ? 6 Minute Walk  ? Phase Initial    ? Distance 1023 feet    ? Walk Time 6 minutes    ? # of Rest Breaks 0    ? MPH 1.94    ? METS 1.94    ? RPE 11    ? Perceived Dyspnea  0    ? VO2 Peak 6.78    ? Symptoms No    ? Resting HR 75 bpm    ? Resting BP 118/68    ? Resting Oxygen Saturation  95 %    ? Exercise Oxygen Saturation  during 6 min walk 97 %    ? Max Ex. HR 90 bpm    ? Max Ex. BP 112/58    ? 2 Minute Post BP 116/60    ? ?  ?  ? ?  ? ? ?Oxygen Initial Assessment: ? ? ?Oxygen Re-Evaluation: ? ? ?Oxygen Discharge (Final Oxygen Re-Evaluation): ? ? ?Initial Exercise Prescription: ? Initial Exercise Prescription - 10/13/21 1000   ? ?  ? Date of Initial Exercise RX and Referring Provider  ? Date 10/13/21   ? Referring Provider Dr Landry Mellow MD, Thayer [place], Dr Fransico Him MD, Covering   ? Expected Discharge Date 12/09/21   ?  ? NuStep  ? Level 2   ? SPM 80   ? Minutes 15   ? METs 1.9   ?  ? Track  ? Laps 10   ? Minutes 15   ? METs 2.16   ?  ? Prescription Details  ? Frequency (times per week) 3   ? Duration Progress to 30 minutes of continuous aerobic without signs/symptoms of physical distress   ?  ? Intensity  ? THRR 40-80% of Max Heartrate 63-126   ? Ratings of Perceived Exertion 11-13   ? Perceived Dyspnea 0-4   ?  ? Progression  ? Progression Continue progressive  overload as per policy without  signs/symptoms or physical distress.   ?  ? Resistance Training  ? Training Prescription Yes   ? Weight 3   ? Reps 10-15   ? ?  ?  ? ?  ? ? ?Perform Capillary Blood Glucose checks as needed. ? ?Exercise Prescription Changes: ? ? Exercise Prescription Changes   ? ? [place] Name 10/17/21 0830 11/07/21 0830 11/23/21 0830  ?  ?  ?  ? Response to Exercise  ? Blood Pressure (Admit) 118/66 116/82 136/58    ? Blood Pressure (Exercise) 140/70 118/72 140/76    ? Blood Pressure (Exit) 120/68 110/60 106/58    ? Heart Rate (Admit) 65 bpm 75 bpm 61 bpm    ? Heart Rate (Exercise) 94 bpm 104 bpm 105 bpm    ? Heart Rate (Exit) 76 bpm 81 bpm 64 bpm    ? Rating of Perceived Exertion (Exercise) 12 11.5 11.5    ? Perceived Dyspnea (Exercise) 0 0 0    ? Symptoms 0 0 0    ? Comments Pt first day of exercise Reviewed MET's goals and home exercise Reviewed MET's 3.47    ? Duration Progress to 30 minutes of  aerobic without signs/symptoms of physical distress Progress to 30 minutes of  aerobic without signs/symptoms of physical distress Progress to 30 minutes of  aerobic without signs/symptoms of physical distress    ? Intensity THRR unchanged THRR unchanged THRR unchanged    ?  ? Progression  ? Progression Continue to progress workloads to maintain intensity without signs/symptoms of physical distress. Continue to progress workloads to maintain intensity without signs/symptoms of physical distress. Continue to progress workloads to maintain intensity without signs/symptoms of physical distress.    ? Average METs 3.06 3.44 3.47    ?  ? Resistance Training  ? Training Prescription Yes Yes No    ? Weight 3 4 --    ? Reps 10-15 10-15 --    ? Time 10 Minutes 10 Minutes --    ?  ? NuStep  ? Level '2 2 3    ' ? SPM 80 80 80    ? Minutes '15 15 15    ' ? METs 2.8 3.2 3.5    ?  ? Track  ? Laps '10 23 21    ' ? Minutes '20 15 15    ' ? METs 3.32 3.67 3.44    ?  ? Home Exercise Plan  ? Plans to continue exercise at -- Home (comment) Home (comment)    ? Frequency --  Add 3 additional days to program exercise sessions. Add 3 additional days to program exercise sessions.    ? Initial Home Exercises Provided -- 11/07/20 11/07/20    ? ?  ?  ? ?  ? ? ?Exercise Comments: ? ? Exercise Commen

## 2021-11-30 ENCOUNTER — Encounter (HOSPITAL_COMMUNITY)
Admission: RE | Admit: 2021-11-30 | Discharge: 2021-11-30 | Disposition: A | Discharge status: Home / Self Care | Payer: No Typology Code available for payment source | Source: Ambulatory Visit | Attending: Internal Medicine | Admitting: Internal Medicine

## 2021-11-30 DIAGNOSIS — I2102 ST elevation (STEMI) myocardial infarction involving left anterior descending coronary artery: Secondary | ICD-10-CM

## 2021-11-30 DIAGNOSIS — Z48812 Encounter for surgical aftercare following surgery on the circulatory system: Secondary | ICD-10-CM | POA: Diagnosis not present

## 2021-11-30 DIAGNOSIS — Z955 Presence of coronary angioplasty implant and graft: Secondary | ICD-10-CM

## 2021-12-02 ENCOUNTER — Encounter (HOSPITAL_COMMUNITY)
Admission: RE | Admit: 2021-12-02 | Discharge: 2021-12-02 | Disposition: A | Discharge status: Home / Self Care | Payer: No Typology Code available for payment source | Source: Ambulatory Visit | Attending: Internal Medicine | Admitting: Internal Medicine

## 2021-12-02 DIAGNOSIS — Z955 Presence of coronary angioplasty implant and graft: Secondary | ICD-10-CM

## 2021-12-02 DIAGNOSIS — Z48812 Encounter for surgical aftercare following surgery on the circulatory system: Secondary | ICD-10-CM | POA: Diagnosis not present

## 2021-12-02 DIAGNOSIS — I2102 ST elevation (STEMI) myocardial infarction involving left anterior descending coronary artery: Secondary | ICD-10-CM

## 2021-12-05 ENCOUNTER — Encounter (HOSPITAL_COMMUNITY)
Admission: RE | Admit: 2021-12-05 | Discharge: 2021-12-05 | Disposition: A | Discharge status: Home / Self Care | Payer: No Typology Code available for payment source | Source: Ambulatory Visit | Attending: Internal Medicine | Admitting: Internal Medicine

## 2021-12-05 DIAGNOSIS — Z48812 Encounter for surgical aftercare following surgery on the circulatory system: Secondary | ICD-10-CM | POA: Diagnosis not present

## 2021-12-05 DIAGNOSIS — I2102 ST elevation (STEMI) myocardial infarction involving left anterior descending coronary artery: Secondary | ICD-10-CM

## 2021-12-05 DIAGNOSIS — Z955 Presence of coronary angioplasty implant and graft: Secondary | ICD-10-CM

## 2021-12-07 ENCOUNTER — Encounter (HOSPITAL_COMMUNITY)
Admission: RE | Admit: 2021-12-07 | Discharge: 2021-12-07 | Disposition: A | Discharge status: Home / Self Care | Payer: No Typology Code available for payment source | Source: Ambulatory Visit | Attending: Internal Medicine | Admitting: Internal Medicine

## 2021-12-07 ENCOUNTER — Other Ambulatory Visit: Payer: Self-pay | Admitting: *Deleted

## 2021-12-07 DIAGNOSIS — Z955 Presence of coronary angioplasty implant and graft: Secondary | ICD-10-CM

## 2021-12-07 DIAGNOSIS — I2102 ST elevation (STEMI) myocardial infarction involving left anterior descending coronary artery: Secondary | ICD-10-CM

## 2021-12-07 DIAGNOSIS — Z48812 Encounter for surgical aftercare following surgery on the circulatory system: Secondary | ICD-10-CM | POA: Diagnosis not present

## 2021-12-07 MED ORDER — METOPROLOL SUCCINATE ER 50 MG PO TB24: 50.0000 mg | ORAL_TABLET | Freq: Two times a day (BID) | ORAL | 3 refills | Status: DC

## 2021-12-09 ENCOUNTER — Encounter (HOSPITAL_COMMUNITY)
Admission: RE | Admit: 2021-12-09 | Discharge: 2021-12-09 | Disposition: A | Discharge status: Home / Self Care | Payer: No Typology Code available for payment source | Source: Ambulatory Visit | Attending: Internal Medicine | Admitting: Internal Medicine

## 2021-12-09 DIAGNOSIS — Z48812 Encounter for surgical aftercare following surgery on the circulatory system: Secondary | ICD-10-CM | POA: Diagnosis not present

## 2021-12-09 DIAGNOSIS — I2102 ST elevation (STEMI) myocardial infarction involving left anterior descending coronary artery: Secondary | ICD-10-CM

## 2021-12-09 DIAGNOSIS — Z955 Presence of coronary angioplasty implant and graft: Secondary | ICD-10-CM

## 2021-12-12 ENCOUNTER — Encounter (HOSPITAL_COMMUNITY)
Admission: RE | Admit: 2021-12-12 | Discharge: 2021-12-12 | Disposition: A | Discharge status: Home / Self Care | Payer: No Typology Code available for payment source | Source: Ambulatory Visit | Attending: Internal Medicine | Admitting: Internal Medicine

## 2021-12-12 VITALS — Ht 67.75 in | Wt 254.2 lb

## 2021-12-12 DIAGNOSIS — Z48812 Encounter for surgical aftercare following surgery on the circulatory system: Secondary | ICD-10-CM | POA: Diagnosis not present

## 2021-12-12 DIAGNOSIS — Z955 Presence of coronary angioplasty implant and graft: Secondary | ICD-10-CM

## 2021-12-12 DIAGNOSIS — I2102 ST elevation (STEMI) myocardial infarction involving left anterior descending coronary artery: Secondary | ICD-10-CM

## 2021-12-14 ENCOUNTER — Encounter (HOSPITAL_COMMUNITY)
Admission: RE | Admit: 2021-12-14 | Discharge: 2021-12-14 | Disposition: A | Discharge status: Home / Self Care | Payer: No Typology Code available for payment source | Source: Ambulatory Visit | Attending: Internal Medicine | Admitting: Internal Medicine

## 2021-12-14 DIAGNOSIS — I2102 ST elevation (STEMI) myocardial infarction involving left anterior descending coronary artery: Secondary | ICD-10-CM

## 2021-12-14 DIAGNOSIS — Z955 Presence of coronary angioplasty implant and graft: Secondary | ICD-10-CM

## 2021-12-14 DIAGNOSIS — Z48812 Encounter for surgical aftercare following surgery on the circulatory system: Secondary | ICD-10-CM | POA: Diagnosis not present

## 2021-12-15 ENCOUNTER — Telehealth: Payer: Self-pay | Admitting: Allergy and Immunology

## 2021-12-15 NOTE — Telephone Encounter (Signed)
Spoke with pts mom who is in the home with him and wanted to know how often to do the albuterol nebulizer informed them every 4-6 hours as need they stated understanding  ?

## 2021-12-15 NOTE — Telephone Encounter (Signed)
Patient called and needs to talk with a nurse about his allergic are bad and wanted to know if he can use his nebulizer more than once a day [phone] ? ?

## 2021-12-16 ENCOUNTER — Encounter (HOSPITAL_COMMUNITY)
Admission: RE | Admit: 2021-12-16 | Discharge: 2021-12-16 | Disposition: A | Discharge status: Home / Self Care | Payer: No Typology Code available for payment source | Source: Ambulatory Visit | Attending: Internal Medicine | Admitting: Internal Medicine

## 2021-12-16 DIAGNOSIS — Z48812 Encounter for surgical aftercare following surgery on the circulatory system: Secondary | ICD-10-CM | POA: Diagnosis not present

## 2021-12-16 DIAGNOSIS — Z955 Presence of coronary angioplasty implant and graft: Secondary | ICD-10-CM

## 2021-12-16 DIAGNOSIS — I2102 ST elevation (STEMI) myocardial infarction involving left anterior descending coronary artery: Secondary | ICD-10-CM

## 2021-12-16 NOTE — Progress Notes (Addendum)
Discharge Progress Report ? ?Patient Details  ?Name: Harold Warner ?MRN: [id number] ?Date of Birth: 1961/01/30 ?Referring Provider:   ?Flowsheet Row CARDIAC REHAB PHASE II ORIENTATION from 10/13/2021 in [place]  ?Referring Provider Dr Landry Mellow MD, Thayer [place], Dr Fransico Him MD, Covering  ? ?  ? ? ? ?Number of Visits: 24 ? ?Reason for Discharge:  ?Patient reached a stable level of exercise. ?Patient independent in their exercise. ?Patient has met program and personal goals. ? ?Smoking History:  ?Social History  ? ?Tobacco Use  ?Smoking Status Never  ?Smokeless Tobacco Never  ? ? ?Diagnosis:  ?06/19/21 STEMI ? ?06/19/21 S/P DES LAD ? ?ADL [facility]: ? ? ?Initial Exercise Prescription: ? Initial Exercise Prescription - 10/13/21 1000   ? ?  ? Date of Initial Exercise RX and Referring Provider  ? Date 10/13/21   ? Referring Provider Dr Landry Mellow MD, Thayer [place], Dr Fransico Him MD, Covering   ? Expected Discharge Date 12/09/21   ?  ? NuStep  ? Level 2   ? SPM 80   ? Minutes 15   ? METs 1.9   ?  ? Track  ? Laps 10   ? Minutes 15   ? METs 2.16   ?  ? Prescription Details  ? Frequency (times per week) 3   ? Duration Progress to 30 minutes of continuous aerobic without signs/symptoms of physical distress   ?  ? Intensity  ? THRR 40-80% of Max Heartrate 63-126   ? Ratings of Perceived Exertion 11-13   ? Perceived Dyspnea 0-4   ?  ? Progression  ? Progression Continue progressive overload as per policy without signs/symptoms or physical distress.   ?  ? Resistance Training  ? Training Prescription Yes   ? Weight 3   ? Reps 10-15   ? ?  ?  ? ?  ? ? ?Discharge Exercise Prescription (Final Exercise Prescription Changes): ? Exercise Prescription Changes - 12/16/21 1639   ? ?  ? Response to Exercise  ? Blood Pressure (Admit) 128/78   ? Blood Pressure (Exercise) 140/78   ? Blood Pressure (Exit) 106/60   ? Heart Rate (Admit) 67 bpm   ? Heart Rate (Exercise) 102 bpm   ? Heart  Rate (Exit) 72 bpm   ? Rating of Perceived Exertion (Exercise) 11.5   ? Perceived Dyspnea (Exercise) 0   ? Symptoms 0   ? Comments Pt graduated the CRP2   ? Duration Progress to 30 minutes of  aerobic without signs/symptoms of physical distress   ? Intensity THRR unchanged   ?  ? Progression  ? Progression Continue to progress workloads to maintain intensity without signs/symptoms of physical distress.   ? Average METs 3.5   ?  ? Resistance Training  ? Training Prescription Yes   ? Weight 4   ? Reps 10-15   ? Time 10 Minutes   ?  ? NuStep  ? Level 3   ? SPM 80   ? Minutes 15   ? METs 3.5   ?  ? Track  ? Laps 22   ? Minutes 15   ? METs 3.55   ?  ? Home Exercise Plan  ? Plans to continue exercise at Home (comment)   ? Frequency Add 3 additional days to program exercise sessions.   ? Initial Home Exercises Provided 11/07/20   ? ?  ?  ? ?  ? ? ?Functional Capacity: ?  6 Minute Walk   ? ? [place] Name 10/13/21 1032 12/12/21 0955  ?  ?  ? 6 Minute Walk  ? Phase Initial Discharge   ? Distance 1023 feet 1719 feet   ? Distance % Change -- 68.04 %   ? Distance Feet Change -- 696 ft   ? Walk Time 6 minutes 6 minutes   ? # of Rest Breaks 0 0   ? MPH 1.94 3.26   ? METS 1.94 3.32   ? RPE 11 11   ? Perceived Dyspnea  0 0   ? VO2 Peak 6.78 11.61   ? Symptoms No No   ? Resting HR 75 bpm 64 bpm   ? Resting BP 118/68 104/68   ? Resting Oxygen Saturation  95 % 98 %   ? Exercise Oxygen Saturation  during 6 min walk 97 % 97 %   ? Max Ex. HR 90 bpm 97 bpm   ? Max Ex. BP 112/58 122/62   ? 2 Minute Post BP 116/60 120/64   ? ?  ?  ? ?  ? ? ?Psychological, QOL, Others - Outcomes: ?PHQ 2/9: ? ?  12/16/2021  ?  8:13 AM 10/13/2021  ?  9:14 AM  ?Depression screen PHQ 2/9  ?Decreased Interest 0 0  ?Down, Depressed, Hopeless 0 0  ?PHQ - 2 Score 0 0  ?Altered sleeping 1   ?Tired, decreased energy 0   ?Change in appetite 0   ?Feeling bad or failure about yourself  0   ?Trouble concentrating 0   ?Moving slowly or fidgety/restless 0   ?Suicidal thoughts 0    ?PHQ-9 Score 1   ?Difficult doing work/chores Not difficult at all   ? ? ?Quality of Life: ? Quality of Life - 10/13/21 0921   ? ?  ? Quality of Life  ? Select Quality of Life   ?  ? Quality of Life Scores  ? Health/Function Pre 27.8 %   ? Socioeconomic Pre 27.38 %   ? Psych/Spiritual Pre 30 %   ? Family Pre 28 %   ? GLOBAL Pre 27.17 %   ? ?  ?  ? ?  ? ? ?Personal Goals: ?Goals established at orientation with interventions provided to work toward goal. ? Personal Goals and Risk Factors at Admission - 10/13/21 1040   ? ?  ? Core Components/Risk Factors/Patient Goals on Admission  ?  Weight Management Yes;Obesity;Weight Loss   ? Intervention Weight Management: Develop a combined nutrition and exercise program designed to reach desired caloric intake, while maintaining appropriate intake of nutrient and fiber, sodium and fats, and appropriate energy expenditure required for the weight goal.;Weight Management: Provide education and appropriate resources to help participant work on and attain dietary goals.;Weight Management/Obesity: Establish reasonable short term and long term weight goals.;Obesity: Provide education and appropriate resources to help participant work on and attain dietary goals.   ? Admit Weight 257 lb 4.4 oz (116.7 kg)   ? Expected Outcomes Short Term: Continue to assess and modify interventions until short term weight is achieved;Long Term: Adherence to nutrition and physical activity/exercise program aimed toward attainment of established weight goal;Weight Loss: Understanding of general recommendations for a balanced deficit meal plan, which promotes 1-2 lb weight loss per week and includes a negative energy balance of [phone] kcal/d;Understanding recommendations for meals to include 15-35% energy as protein, 25-35% energy from fat, 35-60% energy from carbohydrates, less than 273m of dietary cholesterol, 20-35 gm of total fiber  daily;Understanding of distribution of calorie intake throughout the  day with the consumption of 4-5 meals/snacks   ? Hypertension Yes   ? Intervention Provide education on lifestyle modifcations including regular physical activity/exercise, weight management, moderate sodium restriction and increased consumption of fresh fruit, vegetables, and low fat dairy, alcohol moderation, and smoking cessation.;Monitor prescription use compliance.   ? Expected Outcomes Short Term: Continued assessment and intervention until BP is < 140/69m HG in hypertensive participants. < 130/824mHG in hypertensive participants with diabetes, heart failure or chronic kidney disease.;Long Term: Maintenance of blood pressure at goal levels.   ? Lipids Yes   ? Intervention Provide education and support for participant on nutrition & aerobic/resistive exercise along with prescribed medications to achieve LDL <7083mHDL >16m72m ? Expected Outcomes Short Term: Participant states understanding of desired cholesterol values and is compliant with medications prescribed. Participant is following exercise prescription and nutrition guidelines.;Long Term: Cholesterol controlled with medications as prescribed, with individualized exercise RX and with personalized nutrition plan. Value goals: LDL < 70mg92mL > 40 mg.   ? Stress Yes   ? Intervention Offer individual and/or small group education and counseling on adjustment to heart disease, stress management and health-related lifestyle change. Teach and support self-help strategies.;Refer participants experiencing significant psychosocial distress to appropriate mental health specialists for further evaluation and treatment. When possible, include family members and significant others in education/counseling sessions.   ? Expected Outcomes Short Term: Participant demonstrates changes in health-related behavior, relaxation and other stress management skills, ability to obtain effective social support, and compliance with psychotropic medications if prescribed.;Long  Term: Emotional wellbeing is indicated by absence of clinically significant psychosocial distress or social isolation.   ? Personal Goal Other Yes   ? Personal Goal Long and short the same: flexibilty, str

## 2021-12-20 ENCOUNTER — Encounter: Payer: Self-pay | Admitting: Allergy and Immunology

## 2021-12-20 ENCOUNTER — Ambulatory Visit (INDEPENDENT_AMBULATORY_CARE_PROVIDER_SITE_OTHER): Payer: Self-pay | Admitting: Allergy and Immunology

## 2021-12-20 VITALS — BP 102/68 | HR 74 | Resp 16 | Ht 67.0 in | Wt 247.0 lb

## 2021-12-20 DIAGNOSIS — J3089 Other allergic rhinitis: Secondary | ICD-10-CM

## 2021-12-20 DIAGNOSIS — J069 Acute upper respiratory infection, unspecified: Secondary | ICD-10-CM

## 2021-12-20 DIAGNOSIS — K219 Gastro-esophageal reflux disease without esophagitis: Secondary | ICD-10-CM

## 2021-12-20 DIAGNOSIS — J4541 Moderate persistent asthma with (acute) exacerbation: Secondary | ICD-10-CM

## 2021-12-20 MED ORDER — MUCINEX DM 30-600 MG PO TB12: 2.0000 | ORAL_TABLET | Freq: Two times a day (BID) | ORAL | 1 refills | Status: DC | PRN

## 2021-12-20 MED ORDER — METHYLPREDNISOLONE ACETATE 80 MG/ML IJ SUSP
80.0000 mg | Freq: Once | INTRAMUSCULAR | Status: AC
  Administered 2021-12-20: 80 mg via INTRAMUSCULAR

## 2021-12-20 MED ORDER — MONTELUKAST SODIUM 10 MG PO TABS: 10.0000 mg | ORAL_TABLET | Freq: Every day | ORAL | 5 refills | Status: DC

## 2021-12-20 MED ORDER — ALBUTEROL SULFATE (2.5 MG/3ML) 0.083% IN NEBU: 2.5000 mg | INHALATION_SOLUTION | RESPIRATORY_TRACT | 1 refills | Status: DC | PRN

## 2021-12-20 MED ORDER — CETIRIZINE HCL 10 MG PO TABS: 10.0000 mg | ORAL_TABLET | Freq: Two times a day (BID) | ORAL | 5 refills | Status: DC | PRN

## 2021-12-20 MED ORDER — FAMOTIDINE 40 MG PO TABS: 40.0000 mg | ORAL_TABLET | Freq: Two times a day (BID) | ORAL | 5 refills | Status: DC

## 2021-12-20 MED ORDER — IPRATROPIUM-ALBUTEROL 0.5-2.5 (3) MG/3ML IN SOLN: 3.0000 mL | RESPIRATORY_TRACT | 5 refills | Status: DC | PRN

## 2021-12-20 MED ORDER — FLUTICASONE-SALMETEROL 250-50 MCG/ACT IN AEPB: 1.0000 | INHALATION_SPRAY | Freq: Two times a day (BID) | RESPIRATORY_TRACT | 5 refills | Status: DC

## 2021-12-20 MED ORDER — ALBUTEROL SULFATE HFA 108 (90 BASE) MCG/ACT IN AERS: 2.0000 | INHALATION_SPRAY | RESPIRATORY_TRACT | 1 refills | Status: DC | PRN

## 2021-12-20 NOTE — Progress Notes (Signed)
? ?[place] [facility] [employer] [facility] [place] [facility] [place] -  ? ? ?Follow-up Note ? ?Referring Provider: Anson Fret, MD ?Primary Provider: Anson Fret, MD ?Date of Office Visit: 12/20/2021 ? ?Subjective:  ? ?Harold Warner (DOB: 1961/07/07) is a 61 y.o. male who [facility] in re-evaluation of the following: ? ?HPI: Harold Warner returns to this clinic in evaluation of asthma, allergic rhinitis, reflux, and sleep apnea.  His last visit to this clinic was 25 October 2021. ? ?Harold Warner was doing quite well with his asthma and allergic rhinitis and reflux and sleep apnea without any difficulty while he continued on a combination inhaler only used 1 time per day, montelukast, rare nasal steroid, consistent use of famotidine, and continued on his CPAP. ? ?Unfortunately, 4 days ago he had acute onset of dry cough, "ball in the back of his throat", stuffiness, runny nose with clear material.  He does not have any fever or aches or chills or phlegm production or ugly nasal discharge or anosmia or any other significant respiratory tract symptoms.  He can stop coughing.  He has used his nebulized bronchodilator a few times per day. ? ?His reflux remains under good control. ? ?He continues on CPAP at nighttime. ? ?Allergies as of 12/20/2021   ? ?   Reactions  ? Adhesive [tape] Rash  ? ?  ? ?  ?Medication List  ? ? ?albuterol (2.5 MG/3ML) 0.083% nebulizer solution ?Commonly known as: PROVENTIL ?Take 3 mLs (2.5 mg total) by nebulization every 4 (four) hours as needed for wheezing or shortness of breath. ?  ?albuterol 108 (90 Base) MCG/ACT inhaler ?Commonly known as: VENTOLIN HFA ?Inhale 2 puffs into the lungs every 4 (four) hours as needed for wheezing or shortness of breath. Take 2 puffs every 4-6 hours if needed ?  ?amitriptyline 25 MG tablet ?Commonly known as: ELAVIL ?Take 12.5 tablets by mouth at bedtime. ?  ?aspirin EC 81 MG tablet ?Take 81 mg by mouth daily. ?  ?atorvastatin 80  MG tablet ?Commonly known as: LIPITOR ?Take 1 tablet (80 mg total) by mouth daily. ?  ?Brilinta 90 MG Tabs tablet ?Generic drug: ticagrelor ?Take 1 tablet (90 mg total) by mouth 2 (two) times daily. ?  ?busPIRone 10 MG tablet ?Commonly known as: BUSPAR ?Take 10 mg by mouth daily. ?  ?cetirizine 10 MG tablet ?Commonly known as: ZYRTEC ?Take 1 tablet (10 mg total) by mouth 2 (two) times daily as needed for allergies (Can use an extra dose during flares). ?  ?Cholecalciferol 50 MCG (2000 UT) Tabs ?Take 2,000 Units by mouth daily. ?  ?famotidine 40 MG tablet ?Commonly known as: PEPCID ?Take 1 tablet (40 mg total) by mouth 2 (two) times daily. ?  ?fluticasone-salmeterol 250-50 MCG/ACT Aepb ?Commonly known as: Wixela Inhub ?Inhale 1 puff into the lungs in the morning and at bedtime. ?  ?losartan 25 MG tablet ?Commonly known as: COZAAR ?Take 25 mg by mouth daily. ?  ?Melatonin 5 MG Chew ?Chew 5 mg by mouth at bedtime as needed (Sleep). ?  ?metoprolol succinate 50 MG 24 hr tablet ?Commonly known as: TOPROL-XL ?Take 1 tablet (50 mg total) by mouth in the morning and at bedtime. Take with or immediately following a meal. ?  ?montelukast 10 MG tablet ?Commonly known as: SINGULAIR ?Take 1 tablet (10 mg total) by mouth at bedtime. ?  ?Mucinex DM 30-600 MG Tb12 ?Take 2 tablets by mouth 2 (two) times daily as needed (Use  if needed during flare ups.). ?  ?sertraline 100 MG tablet ?Commonly known as: ZOLOFT ?Take 50 mg by mouth every morning. ?  ? ?Past Medical History:  ?Diagnosis Date  ? Acid reflux   ? Asthma   ? Atypical chest pain   ? Coronary artery disease   ? Hyperlipidemia   ? Hypertension   ? Hypokalemia   ? Hypopotassemia   ? Obesity   ? Recurrent upper respiratory infection (URI)   ? ? ?Past Surgical History:  ?Procedure Laterality Date  ? ADENOIDECTOMY    ? CARDIAC CATHETERIZATION    ? CORONARY/GRAFT ACUTE MI REVASCULARIZATION N/A 06/19/2021  ? Procedure: Coronary/Graft Acute MI Revascularization;  Surgeon: Orbie Pyohukkani,  Arun K, MD;  Location: [facility];  Service: Cardiovascular;  Laterality: N/A;  ? HERNIA REPAIR    ? LEFT HEART CATH AND CORONARY ANGIOGRAPHY N/A 06/19/2021  ? Procedure: LEFT HEART CATH AND CORONARY ANGIOGRAPHY;  Surgeon: Orbie Pyohukkani, Arun K, MD;  Location: [facility];  Service: Cardiovascular;  Laterality: N/A;  ? ? ?Review of systems negative except as noted in HPI / PMHx or noted below: ? ?Review of Systems  ?Constitutional: Negative.   ?HENT: Negative.    ?Eyes: Negative.   ?Respiratory: Negative.    ?Cardiovascular: Negative.   ?Gastrointestinal: Negative.   ?Genitourinary: Negative.   ?Musculoskeletal: Negative.   ?Skin: Negative.   ?Neurological: Negative.   ?Endo/Heme/Allergies: Negative.   ?Psychiatric/Behavioral: Negative.    ? ? ?Objective:  ? ?Vitals:  ? 12/20/21 0913  ?BP: 102/68  ?Pulse: 74  ?Resp: 16  ?SpO2: 96%  ? ?Height: 5\' 7"  (170.2 cm)  ?Weight: 247 lb (112 kg)  ? ?Physical Exam ?Constitutional:   ?   Appearance: He is not diaphoretic.  ?   Comments: Coughing  ?HENT:  ?   Head: Normocephalic.  ?   Right Ear: Tympanic membrane, ear canal and external ear normal.  ?   Left Ear: Tympanic membrane, ear canal and external ear normal.  ?   Nose: Mucosal edema (Erythematous) and rhinorrhea (Clear) present.  ?   Mouth/Throat:  ?   Pharynx: Uvula midline. No oropharyngeal exudate.  ?Eyes:  ?   Conjunctiva/sclera: Conjunctivae normal.  ?Neck:  ?   Thyroid: No thyromegaly.  ?   Trachea: Trachea normal. No tracheal tenderness or tracheal deviation.  ?Cardiovascular:  ?   Rate and Rhythm: Normal rate and regular rhythm.  ?   Heart sounds: Normal heart sounds, S1 normal and S2 normal. No murmur heard. ?Pulmonary:  ?   Effort: No respiratory distress.  ?   Breath sounds: Normal breath sounds. No stridor. No wheezing (End expiratory wheezing posterior lung fields bilaterally) or rales.  ?Lymphadenopathy:  ?   Head:  ?   Right side of head: No tonsillar adenopathy.  ?   Left side of head: No  tonsillar adenopathy.  ?   Cervical: No cervical adenopathy.  ?Skin: ?   Findings: No erythema or rash.  ?   Nails: There is no clubbing.  ?Neurological:  ?   Mental Status: He is alert.  ? ? ?Diagnostics: none ? ?Assessment and Plan:  ? ?1. Asthma, not well controlled, moderate persistent, with acute exacerbation   ?2. Viral upper respiratory tract infection with cough   ?3. Other allergic rhinitis   ?4. Gastroesophageal reflux disease, unspecified whether esophagitis present   ? ? ?1.  Continue famotidine 40 mg - 1 tablet twice a day ? ?2.  Continue WIXELA 250 -  1 inhalations twice a day (empty lungs) ? ?3.  Continue montelukast 10 mg - 1 tablet once a day ? ?4.  If needed: ? ? A.  Albuterol HFA -2 inhalations or nebulizer every 4-6 hours ? B.  Mucinex DM-2 tablets twice a day ? C.  Antihistamine ? D.  Flonase - 1-2 sprays each nostril 1 time per day ? ?5. For this recent event:  ? ? A. Depomedrol 80 IM delivered in clinic today ? B. OTC Mucinex DM - 2 times per day ? C. Duoneb nebulized every 4-6 hours ? D. Use Wixela - 2 times per day ? E. Use Flonase - 1 spray each nostril 2 times per day ? ?6.  Return to clinic in 6 months or earlier if problem ? ?Harold Warner appears to have a viral respiratory tract infection that is giving rise to inflammation of his airway and he will utilize the plan noted above which includes the administration of a systemic steroid.  Assuming he does well with this plan I will see him back in this clinic in 6 months or earlier if there is a problem. ? ?Laurette Schimke, MD ?Allergy / Immunology ?[facility] ?

## 2021-12-20 NOTE — Patient Instructions (Addendum)
?  1.  Continue famotidine 40 mg - 1 tablet twice a day ? ?2.  Continue WIXELA 250 - 1 inhalations twice a day (empty lungs) ? ?3.  Continue montelukast 10 mg - 1 tablet once a day ? ?4.  If needed: ? ? A.  Albuterol HFA -2 inhalations or nebulizer every 4-6 hours ? B.  Mucinex DM-2 tablets twice a day ? C.  Antihistamine ? D.  Flonase - 1-2 sprays each nostril 1 time per day ? ?5. For this recent event: ? ? A. Depomedrol 80 IM delivered in clinic today ? B. OTC Mucinex DM - 2 times per day ? C. Duoneb nebulized every 4-6 hours ? D. Use Wixela - 2 times per day ? E. Use Flonase - 1 spray each nostril 2 times per day ? ?6.  Return to clinic in 6 months or earlier if problem ? ?  ? ? ?

## 2021-12-21 ENCOUNTER — Encounter: Payer: Self-pay | Admitting: Allergy and Immunology

## 2022-03-25 NOTE — Progress Notes (Signed)
Cardiology Office Note:    Date:  03/27/2022   ID:  Harold Warner, DOB December 09, 1960, MRN [id number]  PCP:  Harold Fret, MD  Cardiologist:  Lesleigh Noe, MD   Referring MD: Harold Fret, MD   Chief Complaint  Patient presents with   Coronary Artery Disease    History of Present Illness:    Harold Warner is a 61 y.o. male with a hx of HTN, OSA on CPAP, HLD, prediabetes A1C 6.1,obesity who presented with STEMI 06/19/21 complicated VF cardiac arrest treated with 1 round of CPR, defib treated with DES LAD plan for DAPT x 1 yr. HS Troponin >24,000, f/u echo LVEF 55-60%.    No angina.  Working at [employer].  Physical job.  No angina.  Denies orthopnea, PND, and peripheral edema.  PCI was done by Dr. Lynnette Caffey.\  Is compliant with CPAP.  Does not smoke.  Past Medical History:  Diagnosis Date   Acid reflux    Asthma    Atypical chest pain    Coronary artery disease    Hyperlipidemia    Hypertension    Hypokalemia    Hypopotassemia    Obesity    Recurrent upper respiratory infection (URI)     Past Surgical History:  Procedure Laterality Date   ADENOIDECTOMY     CARDIAC CATHETERIZATION     CORONARY/GRAFT ACUTE MI REVASCULARIZATION N/A 06/19/2021   Procedure: Coronary/Graft Acute MI Revascularization;  Surgeon: Orbie Pyo, MD;  Location: [facility];  Service: Cardiovascular;  Laterality: N/A;   HERNIA REPAIR     LEFT HEART CATH AND CORONARY ANGIOGRAPHY N/A 06/19/2021   Procedure: LEFT HEART CATH AND CORONARY ANGIOGRAPHY;  Surgeon: Orbie Pyo, MD;  Location: [facility];  Service: Cardiovascular;  Laterality: N/A;    Current Medications: Current Meds  Medication Sig   albuterol (PROVENTIL) (2.5 MG/3ML) 0.083% nebulizer solution Take 3 mLs (2.5 mg total) by nebulization every 4 (four) hours as needed for wheezing or shortness of breath.   albuterol (VENTOLIN HFA) 108 (90 Base) MCG/ACT inhaler Inhale 2 puffs into the  lungs every 4 (four) hours as needed for wheezing or shortness of breath. Take 2 puffs every 4-6 hours if needed   amitriptyline (ELAVIL) 25 MG tablet Take 12.5 tablets by mouth at bedtime.   aspirin EC 81 MG tablet Take 81 mg by mouth daily.   atorvastatin (LIPITOR) 80 MG tablet Take 1 tablet (80 mg total) by mouth daily.   busPIRone (BUSPAR) 10 MG tablet Take 10 mg by mouth daily.   cetirizine (ZYRTEC) 10 MG tablet Take 1 tablet (10 mg total) by mouth 2 (two) times daily as needed for allergies (Can use an extra dose during flares).   Cholecalciferol 50 MCG (2000 UT) TABS Take 2,000 Units by mouth daily.   Dextromethorphan-guaiFENesin (MUCINEX DM) 30-600 MG TB12 Take 2 tablets by mouth 2 (two) times daily as needed (Use if needed during flare ups.).   famotidine (PEPCID) 40 MG tablet Take 1 tablet (40 mg total) by mouth 2 (two) times daily.   fluticasone-salmeterol (WIXELA INHUB) 250-50 MCG/ACT AEPB Inhale 1 puff into the lungs in the morning and at bedtime.   ipratropium-albuterol (DUONEB) 0.5-2.5 (3) MG/3ML SOLN Take 3 mLs by nebulization every 4 (four) hours as needed.   losartan (COZAAR) 25 MG tablet Take 25 mg by mouth daily.   Melatonin 5 MG CHEW Chew 5 mg by mouth at bedtime as needed (Sleep).  metoprolol succinate (TOPROL-XL) 50 MG 24 hr tablet Take 1 tablet (50 mg total) by mouth in the morning and at bedtime. Take with or immediately following a meal.   montelukast (SINGULAIR) 10 MG tablet Take 1 tablet (10 mg total) by mouth at bedtime.   sertraline (ZOLOFT) 100 MG tablet Take 50 mg by mouth every morning.   ticagrelor (BRILINTA) 90 MG TABS tablet Take 1 tablet (90 mg total) by mouth 2 (two) times daily.     Allergies:   Adhesive [tape]   Social History   Socioeconomic History   Marital status: Single    Spouse name: N/A   Number of children: 0   Years of education: 12   Highest education level: Not on file  Occupational History   Not on file  Tobacco Use   Smoking  status: Never   Smokeless tobacco: Never  Vaping Use   Vaping Use: Not on file  Substance and Sexual Activity   Alcohol use: No   Drug use: No   Sexual activity: Not Currently  Other Topics Concern   Not on file  Social History Narrative   ** Merged History Encounter **       ** Merged History Encounter **       Social Determinants of Health   Financial Resource Strain: Not on file  Food Insecurity: Not on file  Transportation Needs: Not on file  Physical Activity: Not on file  Stress: Not on file  Social Connections: Not on file     Family History: The patient's family history includes Hypertension in his mother. There is no history of Allergic rhinitis, Asthma, Eczema, Urticaria, or Angioedema.  ROS:   Please see the history of present illness.    No medication side effects.  Has some fatigue.  All other systems reviewed and are negative.  EKGs/Labs/Other Studies Reviewed:    The following studies were reviewed today: Laboratory data from [facility] May 2023: LDL cholesterol 34 HDL cholesterol 34 Total cholesterol 93   CARDIAC CATH 06/19/2021: Diagnostic Dominance: Right  Intervention    EKG:  EKG no new data  Recent Labs: 06/19/2021: ALT 34 06/22/2021: BUN 13; Creatinine, Ser 1.19; Hemoglobin 15.2; Magnesium 2.0; Platelets 268; Potassium 3.7; Sodium 136  Recent Lipid Panel    Component Value Date/Time   CHOL 192 06/19/2021 2138   TRIG 240 (H) 06/19/2021 2138   HDL 42 06/19/2021 2138   CHOLHDL 4.6 06/19/2021 2138   VLDL 48 (H) 06/19/2021 2138   LDLCALC 102 (H) 06/19/2021 2138    Physical Exam:    VS:  BP 110/64   Pulse 69   Ht 5\' 7"  (1.702 m)   Wt 256 lb (116.1 kg)   SpO2 96%   BMI 40.10 kg/m     Wt Readings from Last 3 Encounters:  03/27/22 256 lb (116.1 kg)  12/20/21 247 lb (112 kg)  12/12/21 254 lb 3.1 oz (115.3 kg)     GEN: Obese. No acute distress HEENT: Normal NECK: No JVD. LYMPHATICS: No lymphadenopathy CARDIAC: No  murmur. RRR no gallop, or edema. VASCULAR:  Normal Pulses. No bruits. RESPIRATORY:  Clear to auscultation without rales, wheezing or rhonchi  ABDOMEN: Soft, non-tender, non-distended, No pulsatile mass, MUSCULOSKELETAL: No deformity  SKIN: Warm and dry NEUROLOGIC:  Alert and oriented x 3 PSYCHIATRIC:  Normal affect   ASSESSMENT:    1. Coronary artery disease involving native coronary artery of native heart without angina pectoris   2. Essential hypertension  3. Hyperlipidemia, unspecified hyperlipidemia type   4. Obstructive sleep apnea   5. Prediabetes    PLAN:    In order of problems listed above:  Secondary prevention discussed.  Continue dual antiplatelet therapy.  Will de-escalate antiplatelet therapy by starting clopidogrel in October and discontinuing Brilinta. Blood pressure is under excellent control.  Continue Toprol-XL 50 mg daily. Continue current dose of Lipitor 80 mg/day.  May actually be able to de-escalate the intensity of Lipitor at next visit in 6 months. Continue to use CPAP.  He is now compliant with it. Continue goal hemoglobin A1c less than 7.  Overall education and awareness concerning secondary risk prevention was discussed in detail: LDL less than 70, hemoglobin A1c less than 7, blood pressure target less than 130/80 mmHg, >150 minutes of moderate aerobic activity per week, avoidance of smoking, weight control (via diet and exercise), and continued surveillance/management of/for obstructive sleep apnea.    Medication Adjustments/Labs and Tests Ordered: Current medicines are reviewed at length with the patient today.  Concerns regarding medicines are outlined above.  No orders of the defined types were placed in this encounter.  No orders of the defined types were placed in this encounter.   Patient Instructions  Medication Instructions:  Your physician recommends that you continue on your current medications as directed. Please refer to the Current  Medication list given to you today.  *If you need a refill on your cardiac medications before your next appointment, please call your pharmacy*  Lab Work: NONE  Testing/Procedures: NONE  Follow-Up: At [facility], you and your health needs are our priority.  As part of our continuing mission to provide you with exceptional heart care, we have created designated Provider Care Teams.  These Care Teams include your primary Cardiologist (physician) and Advanced Practice Providers (APPs -  Physician Assistants and Nurse Practitioners) who all work together to provide you with the care you need, when you need it.  Your next appointment:   6 month(s)  The format for your next appointment:   In Person  Provider:   Lesleigh Noe, MD {  Important Information About Sugar         Signed, Lesleigh Noe, MD  03/27/2022 4:44 PM    [facility]

## 2022-03-27 ENCOUNTER — Encounter: Payer: Self-pay | Admitting: Interventional Cardiology

## 2022-03-27 ENCOUNTER — Ambulatory Visit (INDEPENDENT_AMBULATORY_CARE_PROVIDER_SITE_OTHER): Payer: Self-pay | Admitting: Interventional Cardiology

## 2022-03-27 VITALS — BP 110/64 | HR 69 | Ht 67.0 in | Wt 256.0 lb

## 2022-03-27 DIAGNOSIS — R7303 Prediabetes: Secondary | ICD-10-CM

## 2022-03-27 DIAGNOSIS — G4733 Obstructive sleep apnea (adult) (pediatric): Secondary | ICD-10-CM

## 2022-03-27 DIAGNOSIS — I2510 Atherosclerotic heart disease of native coronary artery without angina pectoris: Secondary | ICD-10-CM

## 2022-03-27 DIAGNOSIS — I10 Essential (primary) hypertension: Secondary | ICD-10-CM

## 2022-03-27 DIAGNOSIS — E785 Hyperlipidemia, unspecified: Secondary | ICD-10-CM

## 2022-03-27 NOTE — Patient Instructions (Signed)
Medication Instructions:  Your physician recommends that you continue on your current medications as directed. Please refer to the Current Medication list given to you today.  *If you need a refill on your cardiac medications before your next appointment, please call your pharmacy*  Lab Work: NONE  Testing/Procedures: NONE  Follow-Up: At [facility], you and your health needs are our priority.  As part of our continuing mission to provide you with exceptional heart care, we have created designated Provider Care Teams.  These Care Teams include your primary Cardiologist (physician) and Advanced Practice Providers (APPs -  Physician Assistants and Nurse Practitioners) who all work together to provide you with the care you need, when you need it.  Your next appointment:   6 month(s)  The format for your next appointment:   In Person  Provider:   Henry W Smith III, MD {  Important Information About Sugar       

## 2022-04-03 ENCOUNTER — Telehealth: Payer: Self-pay

## 2022-04-03 NOTE — Telephone Encounter (Signed)
NOTES SCANNED TO REFERRAL 

## 2022-04-25 ENCOUNTER — Ambulatory Visit: Payer: No Typology Code available for payment source | Admitting: Allergy and Immunology

## 2022-05-09 IMAGING — DX DG SHOULDER 2+V*R*
3 series · 3 of 3 positions shown · non-contrast
Comparison: None.

CLINICAL DATA: Right shoulder pain after lifting

EXAM:
RIGHT SHOULDER - 2+ VIEW

[shoulder ap]
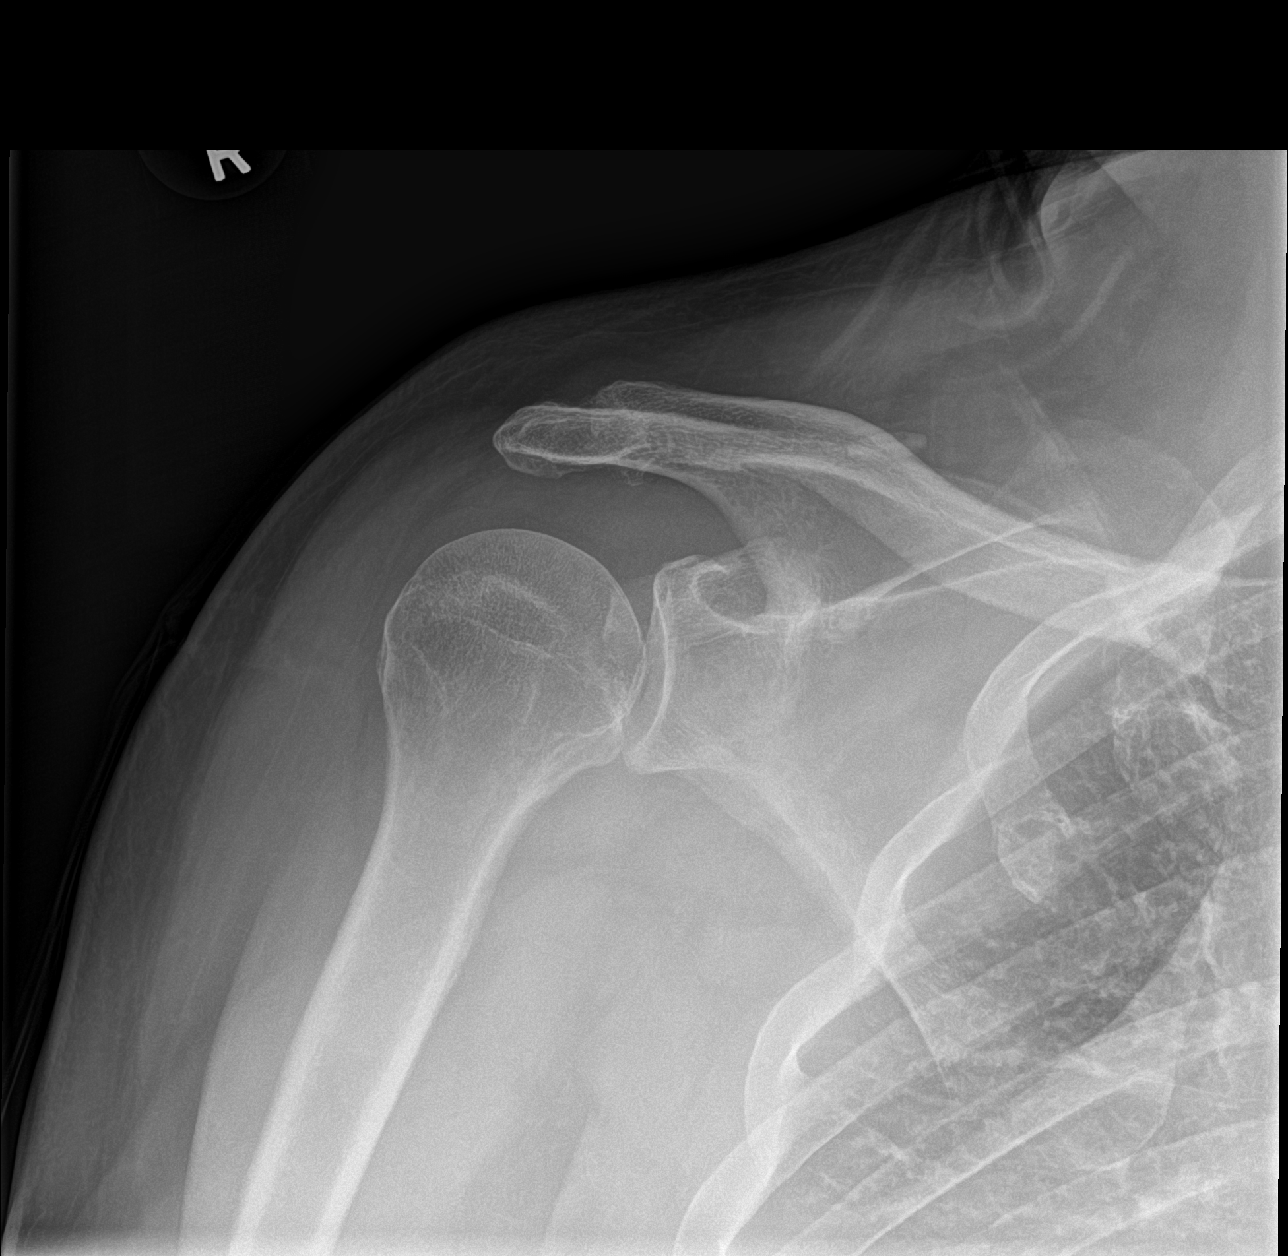

[shoulder y-view]
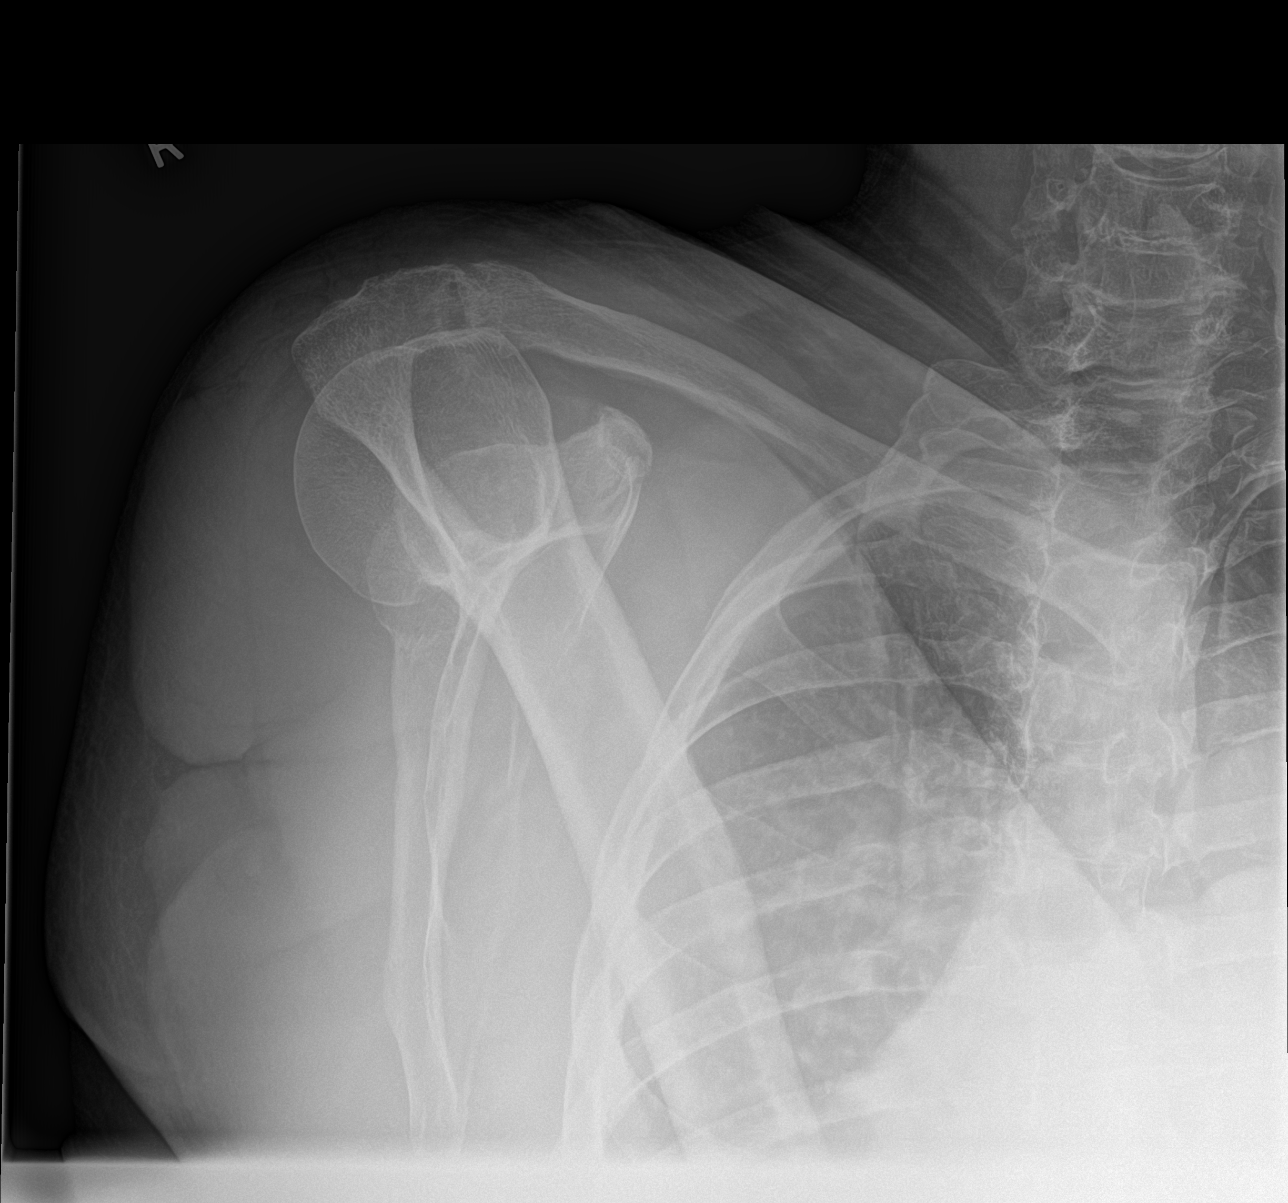

[shoulder axial]
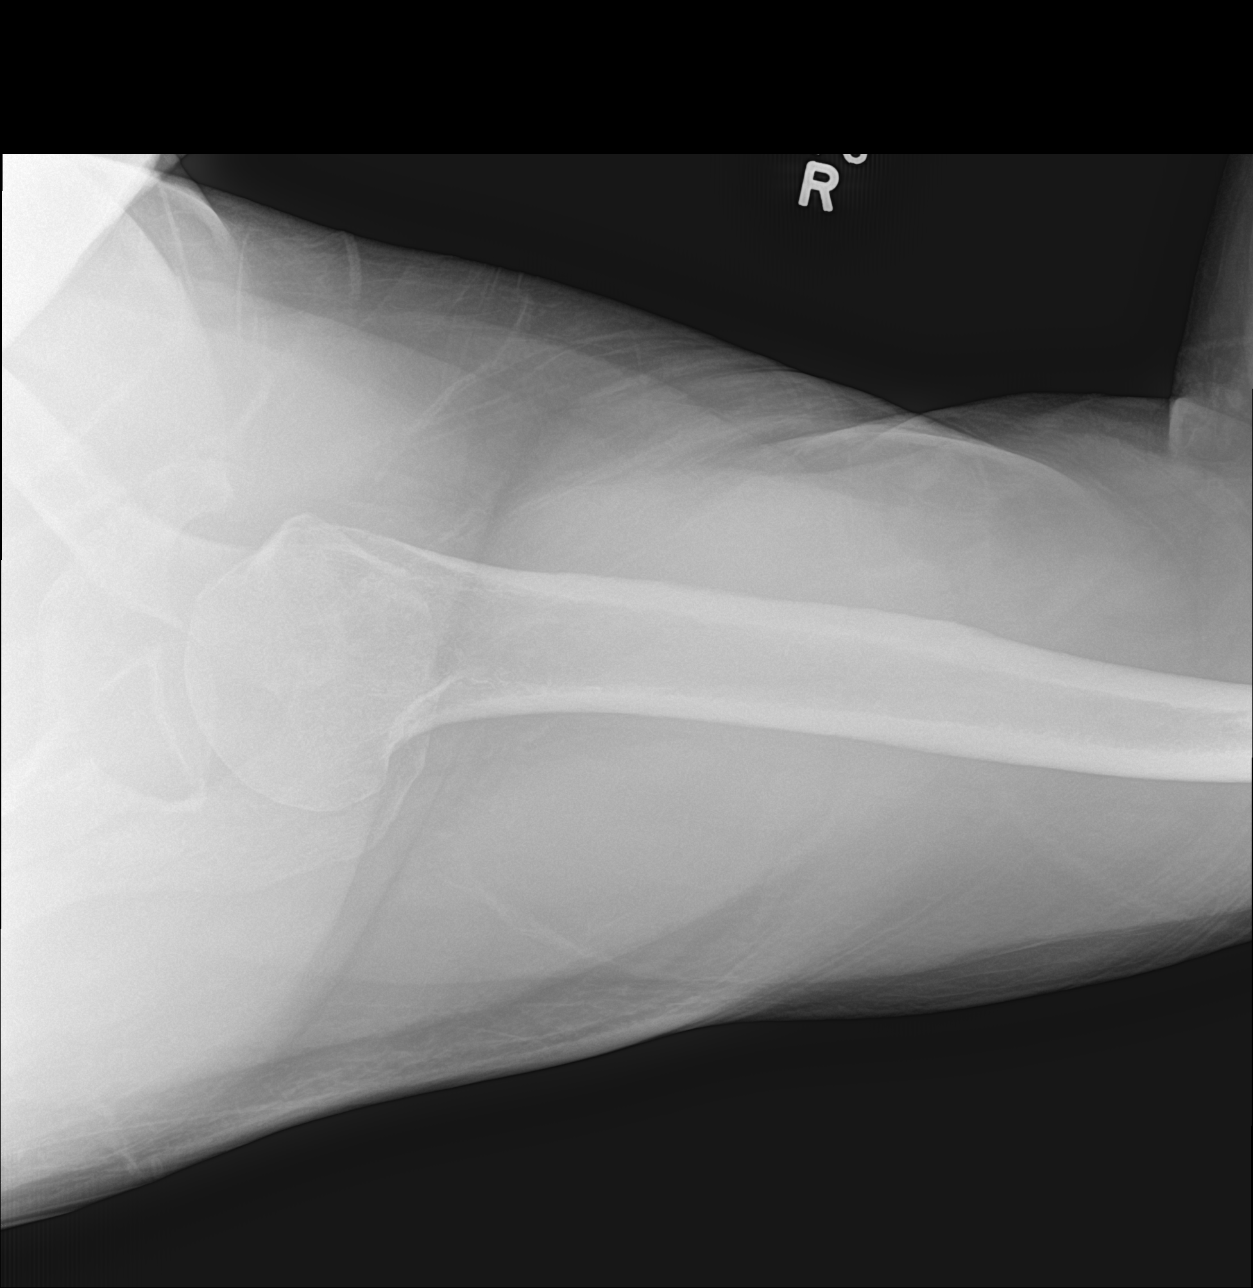

[3 of 3 positions shown; findings below may reference images not displayed]

FINDINGS: No acute displaced fracture or malalignment. AC joint degenerative
change. Mild glenohumeral degenerative change.
IMPRESSION: No acute osseous abnormality.

## 2022-06-06 ENCOUNTER — Ambulatory Visit: Payer: No Typology Code available for payment source | Admitting: Allergy and Immunology

## 2022-06-21 ENCOUNTER — Telehealth: Payer: Self-pay | Admitting: Interventional Cardiology

## 2022-06-21 NOTE — Telephone Encounter (Signed)
Patient came in this morning to pay a bill.  He mentioned that he'd been having some SOB and right arm pain.  He would like for nurse to call him to discuss whether he should come in before his appointment in December.

## 2022-06-21 NOTE — Telephone Encounter (Signed)
Left message for patient to callback.  ED precautions given.

## 2022-06-23 NOTE — Telephone Encounter (Signed)
Returned call to patient.  Patient states he has been experiencing lightheadedness and SOB when walking at his [place]. He states this has been going on intermittently for the past 2-3 weeks. He reports his right arm also feels weak after carrying a gallon of milk this past week.  Patient denies CP, reports feeling indigestion for last 2 days.  Patient requested appt for Wednesdays as he is off from work that day, offered patient appt with Ambrose Pancoast, NP on 06/28/22 at 8:25am. Patient accepted.  Advised on ED precautions.  Patient verbalized understanding and expressed appreciation for call.

## 2022-06-23 NOTE — Telephone Encounter (Signed)
Patient was returning call. Please advise ?

## 2022-06-26 NOTE — Progress Notes (Addendum)
Office Visit    Patient Name: Harold Warner Date of Encounter: 06/26/2022  Primary Care Provider:  Landry Mellow, MD Primary Cardiologist:  Sinclair Grooms, MD Primary Electrophysiologist: None  Chief Complaint    Harold Warner is a 61 y.o. male with PMH of CAD s/p STEMI complicated by cardiac arrest in 2022 with DES to proximal LAD x1, HTN, OSA (on CPAP), HLD, prediabetes, obesity who presents today with complaint of shortness of breath with right arm weakness following ambulation.  Past Medical History    Past Medical History:  Diagnosis Date   Acid reflux    Asthma    Atypical chest pain    Coronary artery disease    Hyperlipidemia    Hypertension    Hypokalemia    Hypopotassemia    Obesity    Recurrent upper respiratory infection (URI)    Past Surgical History:  Procedure Laterality Date   ADENOIDECTOMY     CARDIAC CATHETERIZATION     CORONARY/GRAFT ACUTE MI REVASCULARIZATION N/A 06/19/2021   Procedure: Coronary/Graft Acute MI Revascularization;  Surgeon: Early Osmond, MD;  Location: [place] [facility];  Service: Cardiovascular;  Laterality: N/A;   HERNIA REPAIR     LEFT HEART CATH AND CORONARY ANGIOGRAPHY N/A 06/19/2021   Procedure: LEFT HEART CATH AND CORONARY ANGIOGRAPHY;  Surgeon: Early Osmond, MD;  Location: [place] [facility];  Service: Cardiovascular;  Laterality: N/A;    Allergies  Allergies  Allergen Reactions   Adhesive [Tape] Rash    History of Present Illness    Harold Warner  is a 61 year old male with the above mention past medical history who presents today for complaint of shortness of breath with lightheadedness when ambulating.  Harold Warner was initially seen by Dr. Tamala Julian in 2014 following an admission for complaint  of chest pain.  Patient's work-up in the ED with troponin x1 negative and normal CT scan with EKG revealing no ACS.  He underwent nuclear stress test that showed no evidence of prior infarct or  induced ischemia.  He was discharged following day.  He was seen in the ED on 06/19/2021 with complaint of chest pain and was evaluated for STEMI.  He endorsed chest pressure over 1 day that occurred severe in the central portion of his chest.  He also endorsed shortness of breath that was exertional.  He endorsed pain radiating to his left side and in the ED EKG was performed and showed ST depression with elevation in aVR.  He developed agonal breathing and became unresponsive and was found to be in V-fib with CPR initiated and defibrillation x1 200 J and ROSC achieved.  He was taken emergently to the Cath Lab where LHC was performed and revealed high-grade proximal LAD lesion that was treated with DES x1.  2D echo was completed showing EF of 55-60%, with normal LV function, RWMA not optimally defined and RV systolic function not well-visualized, with no evidence of valvular abnormalities.  Harold Warner presents alone today for follow-up.  Since last being seen in the office patient reports that he is noticed shortness of breath and weakness in his right arm.  He states that his anginal equivalent during his heart attack in 2022 was tingling in the right arm.  He denies any associated symptoms such as chest tightness or dizziness.  He is currently compliant with his medications and is is tolerating them without any side effects.  His blood pressure today was well  controlled at 102/58 and heart rate was 68.  He reports that his shortness of breath has occurred since taking Brilinta and he thought it may have been related to his asthma..  Patient denies chest pain, palpitations, dyspnea, PND, orthopnea, nausea, vomiting, dizziness, syncope, edema, weight gain, or early satiety.   Home Medications    Current Outpatient Medications  Medication Sig Dispense Refill   albuterol (PROVENTIL) (2.5 MG/3ML) 0.083% nebulizer solution Take 3 mLs (2.5 mg total) by nebulization every 4 (four) hours as needed for wheezing  or shortness of breath. 150 mL 1   albuterol (VENTOLIN HFA) 108 (90 Base) MCG/ACT inhaler Inhale 2 puffs into the lungs every 4 (four) hours as needed for wheezing or shortness of breath. Take 2 puffs every 4-6 hours if needed 18 g 1   amitriptyline (ELAVIL) 25 MG tablet Take 12.5 tablets by mouth at bedtime.     aspirin EC 81 MG tablet Take 81 mg by mouth daily.     atorvastatin (LIPITOR) 80 MG tablet Take 1 tablet (80 mg total) by mouth daily. 90 tablet 0   busPIRone (BUSPAR) 10 MG tablet Take 10 mg by mouth daily.     cetirizine (ZYRTEC) 10 MG tablet Take 1 tablet (10 mg total) by mouth 2 (two) times daily as needed for allergies (Can use an extra dose during flares). 60 tablet 5   Cholecalciferol 50 MCG (2000 UT) TABS Take 2,000 Units by mouth daily.     Dextromethorphan-guaiFENesin (MUCINEX DM) 30-600 MG TB12 Take 2 tablets by mouth 2 (two) times daily as needed (Use if needed during flare ups.). 60 tablet 1   famotidine (PEPCID) 40 MG tablet Take 1 tablet (40 mg total) by mouth 2 (two) times daily. 60 tablet 5   fluticasone-salmeterol (WIXELA INHUB) 250-50 MCG/ACT AEPB Inhale 1 puff into the lungs in the morning and at bedtime. 60 each 5   ipratropium-albuterol (DUONEB) 0.5-2.5 (3) MG/3ML SOLN Take 3 mLs by nebulization every 4 (four) hours as needed. 150 mL 5   losartan (COZAAR) 25 MG tablet Take 25 mg by mouth daily.     Melatonin 5 MG CHEW Chew 5 mg by mouth at bedtime as needed (Sleep).     metoprolol succinate (TOPROL-XL) 50 MG 24 hr tablet Take 1 tablet (50 mg total) by mouth in the morning and at bedtime. Take with or immediately following a meal. 180 tablet 3   montelukast (SINGULAIR) 10 MG tablet Take 1 tablet (10 mg total) by mouth at bedtime. 30 tablet 5   sertraline (ZOLOFT) 100 MG tablet Take 50 mg by mouth every morning.     ticagrelor (BRILINTA) 90 MG TABS tablet Take 1 tablet (90 mg total) by mouth 2 (two) times daily. 180 tablet 0   No current facility-administered  medications for this visit.     Review of Systems  Please see the history of present illness.    (+) Increased fatigue (+) Occasional weakness in right hand  All other systems reviewed and are otherwise negative except as noted above.  Physical Exam    Wt Readings from Last 3 Encounters:  03/27/22 256 lb (116.1 kg)  12/20/21 247 lb (112 kg)  12/12/21 254 lb 3.1 oz (115.3 kg)   PZ:WCHEN were no vitals filed for this visit.,There is no height or weight on file to calculate BMI.  Constitutional:      Appearance: Healthy appearance. Not in distress.  Neck:     Vascular: JVD normal.  Pulmonary:  Effort: Pulmonary effort is normal.     Breath sounds: No wheezing. No rales. Diminished in the bases Cardiovascular:     Normal rate. Regular rhythm. Normal S1. Normal S2.      Murmurs: There is no murmur.  Edema:    Peripheral edema absent.  Abdominal:     Palpations: Abdomen is soft non tender. There is no hepatomegaly.  Skin:    General: Skin is warm and dry.  Neurological:     General: No focal deficit present.     Mental Status: Alert and oriented to person, place and time.     Cranial Nerves: Cranial nerves are intact.  EKG/LABS/Other Studies Reviewed    ECG personally reviewed by me today -sinus rhythm with PVC and TWI in leads V2 through V3 consistent with previous EKG with no acute changes noted.   Lab Results  Component Value Date   WBC 12.5 (H) 06/22/2021   HGB 15.2 06/22/2021   HCT 45.0 06/22/2021   MCV 88.8 06/22/2021   PLT 268 06/22/2021   Lab Results  Component Value Date   CREATININE 1.19 06/22/2021   BUN 13 06/22/2021   NA 136 06/22/2021   K 3.7 06/22/2021   CL 103 06/22/2021   CO2 22 06/22/2021   Lab Results  Component Value Date   ALT 34 06/19/2021   AST 46 (H) 06/19/2021   ALKPHOS 90 06/19/2021   BILITOT 1.8 (H) 06/19/2021   Lab Results  Component Value Date   CHOL 192 06/19/2021   HDL 42 06/19/2021   LDLCALC 102 (H) 06/19/2021   TRIG  240 (H) 06/19/2021   CHOLHDL 4.6 06/19/2021    Lab Results  Component Value Date   HGBA1C 6.1 (H) 06/19/2021    Assessment & Plan    1.  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 -He also notes tingling in his right arm which is similar to his anginal equivalent during his heart attack in 2022. -Due to patient's history we will send for Lexiscan Myoview to rule out possible ischemia causing shortness of breath.   2.  Coronary artery disease: -s/p STEMI in 2022 with cardiac arrest and found to have 99% proximal high-grade stenosis in the LAD treated with DES x1 -Today patient reports that his right arm weakness is similar to his anginal equivalent of tingling in the right arm that occurred prior to his STEMI. -Continue GDMT with ASA 81 mg, Lipitor 80 mg, Toprol 50 mg, and Brilinta 90 mg twice daily -Patient will discontinue Brilinta 90 mg twice daily due to shortness of breath and initiate loading dose of Plavix 300 mg once and then 75 mg daily thereafter.  3.  Essential hypertension: -Patient's blood pressure today was 102/58 -Continue losartan 25 mg and Toprol as noted above  4.  Hyperlipidemia: -Last LDL was 102 in 07/2021 -Continue atorvastatin as noted above  5.  Obesity: Patient's BMI is BMI 40.10 kg/m  -Secondary prevention discussed today and physical activity encouraged at least 150 minutes/week     Disposition: Follow-up with Lesleigh Noe, MD or APP in 1 months Shared Decision Making/Informed Consent The risks [chest pain, shortness of breath, cardiac arrhythmias, dizziness, blood pressure fluctuations, myocardial infarction, stroke/transient ischemic attack, nausea, vomiting, allergic reaction, radiation exposure, metallic taste sensation and  life-threatening  complications (estimated to be 1 in 10,000)], benefits (risk stratification, diagnosing coronary artery disease, treatment guidance) and alternatives of a nuclear stress test were discussed in detail with Mr. Ohlrich and he agrees to proceed.   Medication Adjustments/Labs and Tests Ordered: Current medicines are reviewed at length with the patient today.  Concerns regarding medicines are outlined above.   Signed, Harold Warner, Leodis Rains, NP 06/26/2022, 6:24 AM [facility]  Note:  This document was prepared using Dragon voice recognition software and may include unintentional dictation errors.

## 2022-06-27 ENCOUNTER — Other Ambulatory Visit: Payer: Self-pay

## 2022-06-27 ENCOUNTER — Ambulatory Visit (INDEPENDENT_AMBULATORY_CARE_PROVIDER_SITE_OTHER): Payer: Self-pay | Admitting: Family

## 2022-06-27 ENCOUNTER — Encounter: Payer: Self-pay | Admitting: Allergy and Immunology

## 2022-06-27 VITALS — BP 130/84 | HR 64 | Temp 98.6°F | Resp 18 | Ht 67.0 in | Wt 255.0 lb

## 2022-06-27 DIAGNOSIS — J4540 Moderate persistent asthma, uncomplicated: Secondary | ICD-10-CM

## 2022-06-27 DIAGNOSIS — J3089 Other allergic rhinitis: Secondary | ICD-10-CM

## 2022-06-27 DIAGNOSIS — K219 Gastro-esophageal reflux disease without esophagitis: Secondary | ICD-10-CM

## 2022-06-27 MED ORDER — FLUTICASONE-SALMETEROL 250-50 MCG/ACT IN AEPB: INHALATION_SPRAY | RESPIRATORY_TRACT | 5 refills | Status: DC

## 2022-06-27 MED ORDER — MONTELUKAST SODIUM 10 MG PO TABS: 10.0000 mg | ORAL_TABLET | Freq: Every day | ORAL | 5 refills | Status: DC

## 2022-06-27 MED ORDER — ALBUTEROL SULFATE HFA 108 (90 BASE) MCG/ACT IN AERS: 2.0000 | INHALATION_SPRAY | RESPIRATORY_TRACT | 1 refills | Status: DC | PRN

## 2022-06-27 NOTE — Progress Notes (Addendum)
[name] [id number] Dept: [phone]  FOLLOW UP NOTE  Patient ID: Harold Warner, male    DOB: [birth year]  Age: 61 y.o. MRN: [id number] Date of Office Visit: 06/27/2022  Assessment  Chief Complaint: Asthma (No issues ) and Cough (Cough with flem - clear )  HPI Harold Warner is a 61 year old male who presents today for follow-up of not well controlled moderate persistent asthma with acute exacerbation, viral upper respiratory tract infection with cough, allergic rhinitis, and gastroesophageal reflux disease.  He was last seen on Dec 20, 2021 by Dr. Neldon Mc.  He denies any new diagnosis or surgery since his last office visit.  Moderate persistent asthma: He is currently taking Wixela 250 mcg 1 puff once a day, montelukast 10 mg once a day, and albuterol as needed.  He reports a little bit of productive cough with clear sputum, little bit of wheezing, and a little bit of shortness of breath.  He denies fever, chills, tightness in chest, and nocturnal awakenings due to breathing problems.  He does wear a CPAP at night.  Since his last office visit he has not required any systemic steroids.  He has not used his albuterol inhaler in a while.  He has not made any trips to the emergency room or urgent care due to breathing problems since we last saw him.  Allergic rhinitis: He reports postnasal drip and some nasal dryness where he will see a little bit of blood when he blows his nose at times.  He denies nasal congestion.  He has not had any sinus infections since we last saw him.  He continues to take montelukast 10 mg at night.  He is not currently using Flonase nasal spray.  He also continues to take cetirizine 10 mg once a day.  He has not had any sinus infections since we last saw him.  Gastroesophageal reflux disease: He is currently taking famotidine 40 mg twice a day.  He reports reflux symptoms depending on his diet.   Drug Allergies:  Allergies  Allergen Reactions    Adhesive [Tape] Rash    Review of Systems: Review of Systems  Constitutional:  Negative for chills and fever.  HENT:         Reports post nasal drip and some nasal dryness to where he will see a little blood at times when he blows his nose. Denies nasal congestion  Eyes:        Denies itchy watery eyes  Respiratory:  Positive for cough, shortness of breath and wheezing.        Reports a little cough that is productive with clear sputum, little shortness of breath, and a little wheezing. Denies tightness in  chest and nocturnal awakenings due to breathing problems. He does wear a CPAP.  Cardiovascular:  Negative for chest pain and palpitations.  Gastrointestinal:        Reports reflux symptoms depending on his diet  Skin:  Negative for itching and rash.  Neurological:  Negative for headaches.     Physical Exam: BP 130/84   Pulse 64   Temp 98.6 F (37 C)   Resp 18   Ht 5\' 7"  (1.702 m)   Wt 255 lb (115.7 kg)   SpO2 96%   BMI 39.94 kg/m    Physical Exam Constitutional:      Appearance: Normal appearance.  HENT:     Head: Normocephalic and atraumatic.     Comments: Pharynx normal, eyes normal,  ears normal, nose normal    Right Ear: Tympanic membrane, ear canal and external ear normal.     Left Ear: Tympanic membrane, ear canal and external ear normal.     Nose: Nose normal.     Mouth/Throat:     Mouth: Mucous membranes are moist.     Pharynx: Oropharynx is clear.  Eyes:     Conjunctiva/sclera: Conjunctivae normal.  Cardiovascular:     Rate and Rhythm: Normal rate and regular rhythm.     Heart sounds: Normal heart sounds.  Pulmonary:     Effort: Pulmonary effort is normal.     Breath sounds: Normal breath sounds.     Comments: Lungs clear to auscultation Musculoskeletal:     Cervical back: Neck supple.  Skin:    General: Skin is warm.  Neurological:     Mental Status: He is alert and oriented to person, place, and time.  Psychiatric:        Mood and Affect:  Mood normal.        Behavior: Behavior normal.        Thought Content: Thought content normal.        Judgment: Judgment normal.     Diagnostics: FVC 3.19 L (89%), FEV1 2.74 L (99%).  Predicted FVC 3.59 L, predicted FEV1 2.76 L.  Spirometry indicates normal ventilatory function.  Assessment and Plan: 1. Not well controlled moderate persistent asthma   2. Other allergic rhinitis   3. Gastroesophageal reflux disease, unspecified whether esophagitis present     No orders of the defined types were placed in this encounter.   Patient Instructions   1.  Continue famotidine 40 mg - 1 tablet twice a day  2.  Increase WIXELA 250 - 1 inhalations twice a day (empty lungs)  3.  Continue montelukast 10 mg - 1 tablet once a day  4.  If needed:   A.  Albuterol HFA -2 inhalations or nebulizer every 4-6 hours  B.  Mucinex DM-2 tablets twice a day  C.  Antihistamine  D.  Flonase - 1-2 sprays each nostril 1 time per day  E. Saline nasal gel as needed for nasal dryness  5.  Return to clinic in 6 months or earlier if problem      Return in about 6 months (around 12/26/2022), or if symptoms worsen or fail to improve.    Thank you for the opportunity to care for this patient.  Please do not hesitate to contact me with questions.  Nehemiah Settle, FNP [facility]  I have provided oversight concerning NP evaluation and treatment of this patient's health issues addressed during today's encounter. I agree with the assessment and therapeutic plan as outlined in the note.   Signed,   Jessica Priest, MD,  Allergy and Immunology,  [facility].

## 2022-06-27 NOTE — Patient Instructions (Signed)
  1.  Continue famotidine 40 mg - 1 tablet twice a day  2.  Increase WIXELA 250 - 1 inhalations twice a day (empty lungs)  3.  Continue montelukast 10 mg - 1 tablet once a day  4.  If needed:   A.  Albuterol HFA -2 inhalations or nebulizer every 4-6 hours  B.  Mucinex DM-2 tablets twice a day  C.  Antihistamine  D.  Flonase - 1-2 sprays each nostril 1 time per day  E. Saline nasal gel as needed for nasal dryness  5.  Return to clinic in 6 months or earlier if problem

## 2022-06-28 ENCOUNTER — Ambulatory Visit: Payer: No Typology Code available for payment source | Attending: Nurse Practitioner | Admitting: Nurse Practitioner

## 2022-06-28 ENCOUNTER — Encounter: Payer: Self-pay | Admitting: Nurse Practitioner

## 2022-06-28 VITALS — BP 102/58 | HR 68 | Ht 67.0 in | Wt 254.0 lb

## 2022-06-28 DIAGNOSIS — E785 Hyperlipidemia, unspecified: Secondary | ICD-10-CM | POA: Diagnosis not present

## 2022-06-28 DIAGNOSIS — R0602 Shortness of breath: Secondary | ICD-10-CM | POA: Diagnosis not present

## 2022-06-28 DIAGNOSIS — I25118 Atherosclerotic heart disease of native coronary artery with other forms of angina pectoris: Secondary | ICD-10-CM

## 2022-06-28 DIAGNOSIS — I10 Essential (primary) hypertension: Secondary | ICD-10-CM | POA: Diagnosis not present

## 2022-06-28 DIAGNOSIS — Z6841 Body Mass Index (BMI) 40.0 and over, adult: Secondary | ICD-10-CM

## 2022-06-28 MED ORDER — CLOPIDOGREL BISULFATE 75 MG PO TABS: ORAL_TABLET | ORAL | 0 refills | Status: AC

## 2022-06-28 NOTE — Patient Instructions (Signed)
Medication Instructions:  DISCONTINUE Brilinta START Plavix for your first dose take 300mg  (4 tablets) on the first day then take 1 tablet (75mg ) once a day   *If you need a refill on your cardiac medications before your next appointment, please call your pharmacy*   Lab Work: None Ordered   Testing/Procedures: Your physician has requested that you have a lexiscan myoview. For further information please visit . Please follow instruction sheet, as given.   Follow-Up: At [facility], you and your health needs are our priority.  As part of our continuing mission to provide you with exceptional heart care, we have created designated Provider Care Teams.  These Care Teams include your primary Cardiologist (physician) and Advanced Practice Providers (APPs -  Physician Assistants and Nurse Practitioners) who all work together to provide you with the care you need, when you need it.  We recommend signing up for the patient portal called "MyChart".  Sign up information is provided on this After Visit Summary.  MyChart is used to connect with patients for Virtual Visits (Telemedicine).  Patients are able to view lab/test results, encounter notes, upcoming appointments, etc.  Non-urgent messages can be sent to your provider as well.   To learn more about what you can do with MyChart, go to [email or web address]    Your next appointment:   FOLLOW UP AS SCHEDULED  The format for your next appointment:   In Person  Provider:   [facility], MD     Other Instructions   Important Information About Sugar

## 2022-06-29 NOTE — Addendum Note (Signed)
Addended by: Kandice Robinsons T on: 06/29/2022 08:55 AM   Modules accepted: Orders

## 2022-07-05 ENCOUNTER — Telehealth (HOSPITAL_COMMUNITY): Payer: Self-pay | Admitting: *Deleted

## 2022-07-05 NOTE — Telephone Encounter (Signed)
Attempted to call patient regarding upcoming appointment- no answer, unable to leave a message.  Harold Warner  

## 2022-07-10 ENCOUNTER — Telehealth (HOSPITAL_COMMUNITY): Payer: Self-pay | Admitting: *Deleted

## 2022-07-10 NOTE — Telephone Encounter (Signed)
Attempted to call patient regarding upcoming appointment- no answer, unable to leave a message.  Harold Warner  

## 2022-07-12 ENCOUNTER — Encounter (HOSPITAL_COMMUNITY): Payer: No Typology Code available for payment source

## 2022-07-19 NOTE — Addendum Note (Signed)
Addended by: Kandice Robinsons T on: 07/19/2022 02:35 PM   Modules accepted: Orders

## 2022-07-25 NOTE — Progress Notes (Signed)
Cardiology Office Note:    Date:  07/27/2022   ID:  Harold Warner, DOB March 16, 1961, MRN [id number]  PCP:  Anson Fret, MD  Cardiologist:  Lesleigh Noe, MD   Referring MD: Anson Fret, MD   No chief complaint on file.   History of Present Illness:    Harold Warner is a 61 y.o. male with a hx of HTN, OSA on CPAP, HLD, prediabetes A1C 6.1,obesity who presented with STEMI 06/19/21 complicated VF cardiac arrest treated with 1 round of CPR, defib treated with DES LAD plan for DAPT x 1 yr. HS Troponin >24,000, f/u echo LVEF 55-60%.   Seen for dyspnea and arm tingling 06/28/2022. Brillinta switched to Plavix. Nuclear stress test was performed and noted below.  Somewhat winded when he walks around.  No arm tingling.  Because of this complaint, a Lexiscan myocardial perfusion study was ordered.  Alarmingly the ejection fraction is estimated at 31% however, this may be an inaccurate assessment of LV function.  Therefore I will make sure we repeat an echocardiogram.  If this is true, we will get him on a standard triple therapy for systolic dysfunction.  Right now he is taking only losartan and Toprol-XL.  Past Medical History:  Diagnosis Date   Acid reflux    Asthma    Atypical chest pain    Coronary artery disease    Hyperlipidemia    Hypertension    Hypokalemia    Hypopotassemia    Obesity    Recurrent upper respiratory infection (URI)     Past Surgical History:  Procedure Laterality Date   ADENOIDECTOMY     CARDIAC CATHETERIZATION     CORONARY/GRAFT ACUTE MI REVASCULARIZATION N/A 06/19/2021   Procedure: Coronary/Graft Acute MI Revascularization;  Surgeon: Orbie Pyo, MD;  Location: [facility] INVASIVE CV LAB;  Service: Cardiovascular;  Laterality: N/A;   HERNIA REPAIR     LEFT HEART CATH AND CORONARY ANGIOGRAPHY N/A 06/19/2021   Procedure: LEFT HEART CATH AND CORONARY ANGIOGRAPHY;  Surgeon: Orbie Pyo, MD;  Location: [facility] INVASIVE CV LAB;  Service:  Cardiovascular;  Laterality: N/A;    Current Medications: Current Meds  Medication Sig   albuterol (PROVENTIL) (2.5 MG/3ML) 0.083% nebulizer solution Take 3 mLs (2.5 mg total) by nebulization every 4 (four) hours as needed for wheezing or shortness of breath.   albuterol (VENTOLIN HFA) 108 (90 Base) MCG/ACT inhaler Inhale 2 puffs into the lungs every 4 (four) hours as needed for wheezing or shortness of breath. Take 2 puffs every 4-6 hours if needed   amitriptyline (ELAVIL) 25 MG tablet Take 12.5 tablets by mouth at bedtime.   aspirin EC 81 MG tablet Take 81 mg by mouth daily.   atorvastatin (LIPITOR) 80 MG tablet Take 1 tablet (80 mg total) by mouth daily.   busPIRone (BUSPAR) 10 MG tablet Take 10 mg by mouth daily.   cetirizine (ZYRTEC) 10 MG tablet Take 1 tablet (10 mg total) by mouth 2 (two) times daily as needed for allergies (Can use an extra dose during flares).   Cholecalciferol 50 MCG (2000 UT) TABS Take 2,000 Units by mouth daily.   clopidogrel (PLAVIX) 75 MG tablet Take 4 tablets (300 mg total) by mouth daily for 1 day, THEN 1 tablet (75 mg total) daily.   Dextromethorphan-guaiFENesin (MUCINEX DM) 30-600 MG TB12 Take 2 tablets by mouth 2 (two) times daily as needed (Use if needed during flare ups.).   famotidine (PEPCID) 40 MG tablet Take 1 tablet (  40 mg total) by mouth 2 (two) times daily.   fluticasone-salmeterol (WIXELA INHUB) 250-50 MCG/ACT AEPB 1 inhalations twice a day   ipratropium-albuterol (DUONEB) 0.5-2.5 (3) MG/3ML SOLN Take 3 mLs by nebulization every 4 (four) hours as needed.   losartan (COZAAR) 25 MG tablet Take 25 mg by mouth daily.   Melatonin 5 MG CHEW Chew 5 mg by mouth at bedtime as needed (Sleep).   metoprolol succinate (TOPROL-XL) 50 MG 24 hr tablet Take 1 tablet (50 mg total) by mouth in the morning and at bedtime. Take with or immediately following a meal.   montelukast (SINGULAIR) 10 MG tablet Take 1 tablet (10 mg total) by mouth at bedtime.   sertraline  (ZOLOFT) 100 MG tablet Take 50 mg by mouth every morning.     Allergies:   Adhesive [tape]   Social History   Socioeconomic History   Marital status: Single    Spouse name: N/A   Number of children: 0   Years of education: 12   Highest education level: Not on file  Occupational History   Not on file  Tobacco Use   Smoking status: Never   Smokeless tobacco: Never  Vaping Use   Vaping Use: Not on file  Substance and Sexual Activity   Alcohol use: No   Drug use: No   Sexual activity: Not Currently  Other Topics Concern   Not on file  Social History Narrative   ** Merged History Encounter **       ** Merged History Encounter **       Social Determinants of Health   Financial Resource Strain: Not on file  Food Insecurity: Not on file  Transportation Needs: Not on file  Physical Activity: Not on file  Stress: Not on file  Social Connections: Not on file     Family History: The patient's family history includes Hypertension in his mother. There is no history of Allergic rhinitis, Asthma, Eczema, Urticaria, or Angioedema.  ROS:   Please see the history of present illness.    Anxiety concerning his overall heart health all other systems reviewed and are negative.  EKGs/Labs/Other Studies Reviewed:    The following studies were reviewed today: STRESS NUCLEAR 07/2022 Study Highlights      Findings are consistent with prior myocardial infarction with peri-infarct ischemia in the LAD territory. The study is high risk.   No ST deviation was noted.   LV perfusion is abnormal. Defect 1: There is a large defect with severe reduction in uptake present in the apical to basal anteroseptal, inferoseptal, septal and apex location(s) that is partially reversible. There is abnormal wall motion in the defect area. Consistent with peri-infarct ischemia.   Left ventricular function is abnormal. Global function is moderately reduced. Nuclear stress EF: 31 %. The left ventricular  ejection fraction is moderately decreased (30-44%). End diastolic cavity size is moderately enlarged.   Prior study available for comparison from 10/18/2012. COR ANGIOGRAPHY 05/2021: Diagnostic Dominance: Right  Intervention    EKG:  EKG not repeated  Recent Labs: No results found for requested labs within last 365 days.  Recent Lipid Panel    Component Value Date/Time   CHOL 192 06/19/2021 2138   TRIG 240 (H) 06/19/2021 2138   HDL 42 06/19/2021 2138   CHOLHDL 4.6 06/19/2021 2138   VLDL 48 (H) 06/19/2021 2138   LDLCALC 102 (H) 06/19/2021 2138    Physical Exam:    VS:  BP 112/62   Pulse 63  Ht 5\' 7"  (1.702 m)   Wt 248 lb 6.4 oz (112.7 kg)   SpO2 95%   BMI 38.90 kg/m     Wt Readings from Last 3 Encounters:  07/27/22 248 lb 6.4 oz (112.7 kg)  06/28/22 254 lb (115.2 kg)  06/27/22 255 lb (115.7 kg)     GEN: Morbid obesity. No acute distress HEENT: Normal NECK: No JVD. LYMPHATICS: No lymphadenopathy CARDIAC: No murmur. RRR no gallop, or edema. VASCULAR:  Normal Pulses. No bruits. RESPIRATORY:  Clear to auscultation without rales, wheezing or rhonchi  ABDOMEN: Soft, non-tender, non-distended, No pulsatile mass, MUSCULOSKELETAL: No deformity  SKIN: Warm and dry NEUROLOGIC:  Alert and oriented x 3 PSYCHIATRIC:  Normal affect   ASSESSMENT:    1. Shortness of breath   2. Systolic heart failure, unspecified HF chronicity (HCC)   3. Coronary artery disease of native artery of native heart with stable angina pectoris (HCC)   4. Primary hypertension   5. Hyperlipidemia, unspecified hyperlipidemia type   6. Obstructive sleep apnea   7. Prediabetes    PLAN:    In order of problems listed above:  Possibly related to #2 based upon the EF from the nuclear study. 2D Doppler echocardiogram to rule out ischemic cardiomyopathy.  Echocardiogram performed after MI demonstrated an EF of 50%.  Therefore he was not given heart failure therapy at discharge last November.  He  did have an LAD infarct.  Repeat echo at this time will help December to know if there is systolic dysfunction.  Quadruple therapy may be needed. Switching from Brilinta to clopidogrel monotherapy. Blood pressure is low normal. Continue Lipitor 80 mg/day. Not wearing CPAP.  Strongly encouraged to get back on therapy. Follow-up with primary concerning glycemic control.  2D Doppler echocardiogram soon. Established with Dr. [place] and March April timeframe.  Medication Adjustments/Labs and Tests Ordered: Current medicines are reviewed at length with the patient today.  Concerns regarding medicines are outlined above.  Orders Placed This Encounter  Procedures   ECHOCARDIOGRAM COMPLETE   No orders of the defined types were placed in this encounter.   Patient Instructions  Medication Instructions:  Your physician recommends that you continue on your current medications as directed. Please refer to the Current Medication list given to you today.  *Be sure to start taking your clopidogrel (Plavix) today (07/27/22), take 4 tablets today, then take 1 tablet daily thereafter.  *If you need a refill on your cardiac medications before your next appointment, please call your pharmacy*  Testing/Procedures: Your physician has requested that you have an echocardiogram in December 2023. Echocardiography is a painless test that uses sound waves to create images of your heart. It provides your doctor with information about the size and shape of your heart and how well your heart's chambers and valves are working. This procedure takes approximately one hour. There are no restrictions for this procedure. Please do NOT wear cologne, perfume, aftershave, or lotions (deodorant is allowed). Please arrive 15 minutes prior to your appointment time.  Follow-Up: At [facility], you and your health needs are our priority.  As part of our continuing mission to provide you with exceptional heart care, we have  created designated Provider Care Teams.  These Care Teams include your primary Cardiologist (physician) and Advanced Practice Providers (APPs -  Physician Assistants and Nurse Practitioners) who all work together to provide you with the care you need, when you need it.  Your next appointment:   4-6 month(s)  The  format for your next appointment:   In Person  Provider:   Alverda SkeansArun Thukkani, MD  Important Information About Sugar         Signed, Lesleigh NoeHenry W Rosland Riding III, MD  07/27/2022 9:15 AM    [facility]

## 2022-07-26 ENCOUNTER — Ambulatory Visit: Payer: No Typology Code available for payment source | Attending: Nurse Practitioner

## 2022-07-26 DIAGNOSIS — E785 Hyperlipidemia, unspecified: Secondary | ICD-10-CM | POA: Diagnosis present

## 2022-07-26 DIAGNOSIS — R0602 Shortness of breath: Secondary | ICD-10-CM | POA: Diagnosis present

## 2022-07-26 DIAGNOSIS — I10 Essential (primary) hypertension: Secondary | ICD-10-CM | POA: Diagnosis present

## 2022-07-26 DIAGNOSIS — I5020 Unspecified systolic (congestive) heart failure: Secondary | ICD-10-CM | POA: Diagnosis present

## 2022-07-26 DIAGNOSIS — R7303 Prediabetes: Secondary | ICD-10-CM | POA: Diagnosis present

## 2022-07-26 DIAGNOSIS — G4733 Obstructive sleep apnea (adult) (pediatric): Secondary | ICD-10-CM | POA: Diagnosis present

## 2022-07-26 DIAGNOSIS — I25118 Atherosclerotic heart disease of native coronary artery with other forms of angina pectoris: Secondary | ICD-10-CM | POA: Diagnosis present

## 2022-07-26 LAB — MYOCARDIAL PERFUSION IMAGING
LV dias vol: 141 mL (ref 62–150)
LV sys vol: 97 mL
Nuc Stress EF: 31 %
Peak HR: 93 {beats}/min
Rest HR: 73 {beats}/min
Rest Nuclear Isotope Dose: 10.5 mCi
SDS: 10
SRS: 6
SSS: 17
ST Depression (mm): 0 mm
Stress Nuclear Isotope Dose: 31.9 mCi
TID: 1.04

## 2022-07-26 MED ORDER — REGADENOSON 0.4 MG/5ML IV SOLN
0.4000 mg | Freq: Once | INTRAVENOUS | Status: AC
  Administered 2022-07-26: 0.4 mg via INTRAVENOUS

## 2022-07-26 MED ORDER — TECHNETIUM TC 99M TETROFOSMIN IV KIT
10.5000 | PACK | Freq: Once | INTRAVENOUS | Status: AC | PRN
  Administered 2022-07-26: 10.5 via INTRAVENOUS

## 2022-07-26 MED ORDER — TECHNETIUM TC 99M TETROFOSMIN IV KIT
31.9000 | PACK | Freq: Once | INTRAVENOUS | Status: AC | PRN
  Administered 2022-07-26: 31.9 via INTRAVENOUS

## 2022-07-27 ENCOUNTER — Encounter: Payer: Self-pay | Admitting: Interventional Cardiology

## 2022-07-27 ENCOUNTER — Ambulatory Visit (INDEPENDENT_AMBULATORY_CARE_PROVIDER_SITE_OTHER): Payer: No Typology Code available for payment source | Admitting: Interventional Cardiology

## 2022-07-27 VITALS — BP 112/62 | HR 63 | Ht 67.0 in | Wt 248.4 lb

## 2022-07-27 DIAGNOSIS — I10 Essential (primary) hypertension: Secondary | ICD-10-CM | POA: Diagnosis not present

## 2022-07-27 DIAGNOSIS — I5020 Unspecified systolic (congestive) heart failure: Secondary | ICD-10-CM | POA: Diagnosis not present

## 2022-07-27 DIAGNOSIS — E785 Hyperlipidemia, unspecified: Secondary | ICD-10-CM | POA: Insufficient documentation

## 2022-07-27 DIAGNOSIS — R7303 Prediabetes: Secondary | ICD-10-CM

## 2022-07-27 DIAGNOSIS — R0602 Shortness of breath: Secondary | ICD-10-CM | POA: Insufficient documentation

## 2022-07-27 DIAGNOSIS — I25118 Atherosclerotic heart disease of native coronary artery with other forms of angina pectoris: Secondary | ICD-10-CM

## 2022-07-27 DIAGNOSIS — G4733 Obstructive sleep apnea (adult) (pediatric): Secondary | ICD-10-CM | POA: Insufficient documentation

## 2022-07-27 NOTE — Patient Instructions (Signed)
Medication Instructions:  Your physician recommends that you continue on your current medications as directed. Please refer to the Current Medication list given to you today.  *Be sure to start taking your clopidogrel (Plavix) today (07/27/22), take 4 tablets today, then take 1 tablet daily thereafter.  *If you need a refill on your cardiac medications before your next appointment, please call your pharmacy*  Testing/Procedures: Your physician has requested that you have an echocardiogram in December 2023. Echocardiography is a painless test that uses sound waves to create images of your heart. It provides your doctor with information about the size and shape of your heart and how well your heart's chambers and valves are working. This procedure takes approximately one hour. There are no restrictions for this procedure. Please do NOT wear cologne, perfume, aftershave, or lotions (deodorant is allowed). Please arrive 15 minutes prior to your appointment time.  Follow-Up: At [facility], you and your health needs are our priority.  As part of our continuing mission to provide you with exceptional heart care, we have created designated Provider Care Teams.  These Care Teams include your primary Cardiologist (physician) and Advanced Practice Providers (APPs -  Physician Assistants and Nurse Practitioners) who all work together to provide you with the care you need, when you need it.  Your next appointment:   4-6 month(s)  The format for your next appointment:   In Person  Provider:   Alverda Skeans, MD  Important Information About Sugar

## 2022-08-02 ENCOUNTER — Encounter (HOSPITAL_COMMUNITY): Payer: No Typology Code available for payment source

## 2022-08-23 ENCOUNTER — Ambulatory Visit (HOSPITAL_COMMUNITY): Payer: No Typology Code available for payment source | Attending: Interventional Cardiology

## 2022-08-23 DIAGNOSIS — I5020 Unspecified systolic (congestive) heart failure: Secondary | ICD-10-CM | POA: Diagnosis present

## 2022-08-23 LAB — ECHOCARDIOGRAM COMPLETE
Area-P 1/2: 3.77 cm2
S' Lateral: 2.7 cm

## 2022-08-23 MED ORDER — PERFLUTREN LIPID MICROSPHERE
1.0000 mL | INTRAVENOUS | Status: AC | PRN
  Administered 2022-08-23: 1 mL via INTRAVENOUS

## 2022-08-25 ENCOUNTER — Telehealth: Payer: Self-pay | Admitting: Nurse Practitioner

## 2022-08-25 MED ORDER — CLOPIDOGREL BISULFATE 75 MG PO TABS: 75.0000 mg | ORAL_TABLET | Freq: Every day | ORAL | 3 refills | Status: AC

## 2022-08-25 NOTE — Telephone Encounter (Signed)
Pt c/o medication issue:  1. Name of Medication: clopidogrel bisulfate (PLAVIX)  2. How are you currently taking this medication (dosage and times per day)? As written  3. Are you having a reaction (difficulty breathing--STAT)? No   4. What is your medication issue? Patient needs a new prescription sent in to [place], [place] [facility] [place]

## 2022-08-25 NOTE — Telephone Encounter (Signed)
Called and spoke with patient's mother, she states patient is at work, she will have him call back Monday.   Sent Rx for clopidogrel 75mg  QD to [facility].

## 2022-08-28 ENCOUNTER — Telehealth: Payer: Self-pay | Admitting: Interventional Cardiology

## 2022-08-28 DIAGNOSIS — I10 Essential (primary) hypertension: Secondary | ICD-10-CM

## 2022-08-28 DIAGNOSIS — I5020 Unspecified systolic (congestive) heart failure: Secondary | ICD-10-CM

## 2022-08-28 DIAGNOSIS — Z79899 Other long term (current) drug therapy: Secondary | ICD-10-CM

## 2022-08-28 MED ORDER — SACUBITRIL-VALSARTAN 24-26 MG PO TABS: 1.0000 | ORAL_TABLET | Freq: Two times a day (BID) | ORAL | 11 refills | Status: DC

## 2022-08-28 NOTE — Telephone Encounter (Signed)
Returned call to patient.  Discussed results of echo and Dr. Thompson Caul recommendations: Let the patient know the echo confirms mild reduction in heart strength with EF 40-45%. Start Entresto 24/26 mg PO BID and being off Losartan for >/= 24 hours.Will need to follow-up with new cardiologist/APP to add futher therapy for HF, I.e. SGLT-2 and =/- MRA. These should be added sequentially, watching BP and kidney function.    Losartan discontinued.  Entresto 24-26mg  BID sent to pharmacy of choice. 30 day free trial coupon attached to pharmacy note.  BMET ordered, lab appt scheduled same day as F/U appt with Ambrose Pancoast, NP on 09/27/22.  Patient verbalized understanding and expressed appreciation for call.

## 2022-08-28 NOTE — Telephone Encounter (Signed)
Follow Up:      Patient is returning Kristie's call from Friday(08-25-22).

## 2022-08-28 NOTE — Telephone Encounter (Signed)
Pt calling back to f/u on Echo results. Please advise 

## 2022-09-26 NOTE — Progress Notes (Unsigned)
Office Visit    Patient Name: Harold Warner Date of Encounter: 09/27/2022  Primary Care Provider:  Landry Mellow, MD Primary Cardiologist:  Sinclair Grooms, MD (Inactive) Primary Electrophysiologist: None  Chief Complaint    Harold Warner is a 62 y.o. male with PMH of CAD s/p STEMI complicated by cardiac arrest in 2022 with DES to proximal LAD x1, HTN, OSA (on CPAP), HLD, prediabetes, obesity who presents today for follow-up of CHF.  Past Medical History    Past Medical History:  Diagnosis Date   Acid reflux    Asthma    Atypical chest pain    Coronary artery disease    Hyperlipidemia    Hypertension    Hypokalemia    Hypopotassemia    Obesity    Recurrent upper respiratory infection (URI)    Past Surgical History:  Procedure Laterality Date   ADENOIDECTOMY     CARDIAC CATHETERIZATION     CORONARY/GRAFT ACUTE MI REVASCULARIZATION N/A 06/19/2021   Procedure: Coronary/Graft Acute MI Revascularization;  Surgeon: Early Osmond, MD;  Location: [place];  Service: Cardiovascular;  Laterality: N/A;   HERNIA REPAIR     LEFT HEART CATH AND CORONARY ANGIOGRAPHY N/A 06/19/2021   Procedure: LEFT HEART CATH AND CORONARY ANGIOGRAPHY;  Surgeon: Early Osmond, MD;  Location: [place] [facility];  Service: Cardiovascular;  Laterality: N/A;    Allergies  Allergies  Allergen Reactions   Adhesive [Tape] Rash    History of Present Illness   Harold Warner  is a 62 year old male with the above mention past medical history who presents today for complaint of shortness of breath with lightheadedness when ambulating.  Harold Warner was initially seen by Dr. Tamala Julian in 2014 following an admission for complaint  of chest pain.  Patient's work-up in the ED with troponin x1 negative and normal CT scan with EKG revealing no ACS.  He underwent nuclear stress test that showed no evidence of prior infarct or induced ischemia.  He was discharged following day.  He  was seen in the ED on 06/19/2021 with complaint of chest pain and was evaluated for STEMI.  He endorsed chest pressure over 1 day that occurred severe in the central portion of his chest.  He also endorsed shortness of breath that was exertional.  He endorsed pain radiating to his left side and in the ED EKG was performed and showed ST depression with elevation in aVR.  He developed agonal breathing and became unresponsive and was found to be in V-fib with CPR initiated and defibrillation x1 200 J and ROSC achieved.  He was taken emergently to the Cath Lab where LHC was performed and revealed high-grade proximal LAD lesion that was treated with DES x1.  2D echo was completed showing EF of 55-60%, with normal LV function, RWMA not optimally defined and RV systolic function not well-visualized, with no evidence of valvular abnormalities.  He was seen in follow-up on 06/28/2022 with complaint of shortness of breath and right arm tingling which was his anginal equivalent.  Myoview showed reduced EF with possible inaccurate assessment of LV function and 2D echo was repeated by Dr. Tamala Julian when seen at follow-up 07/27/2022.  2D echo showed mildly reduced EF at 40-45% and patient continued to have shortness of breath and was switched to clopidogrel from [facility].  He was also started on Entresto and losartan was discontinued with plan to escalate further with SGLT2 and MRA at follow-up.  Harold Warner presents today for 56-month follow-up alone.  Since last being seen in the office patient reports he has been doing well and feeling much better since his previous visit.  He is tolerating his current medication regimen without any adverse reactions.  His blood pressure today is well-controlled at 122/72 and heart rate is 71 bpm.  He is having some difficulty sleeping due to PTSD from his [employer] service.  He has been followed by therapist at the [facility] for this currently.  During his visit we discussed the pathophysiology of heart  failure and reviewed his current therapeutic regimen.  He was able to ask questions and they were answered to his satisfaction.  He is euvolemic on exam but has increased weight since previous visit about 7 pounds.  We reviewed and discussed benefits of a low-sodium diet maintaining good fluid intake.  He reports that he is currently not exercising but is planning to increase his physical activity as the weather permits..  Patient denies chest pain, palpitations, dyspnea, PND, orthopnea, nausea, vomiting, dizziness, syncope, edema, weight gain, or early satiety.  Home Medications    Current Outpatient Medications  Medication Sig Dispense Refill   albuterol (PROVENTIL) (2.5 MG/3ML) 0.083% nebulizer solution Take 3 mLs (2.5 mg total) by nebulization every 4 (four) hours as needed for wheezing or shortness of breath. 150 mL 1   albuterol (VENTOLIN HFA) 108 (90 Base) MCG/ACT inhaler Inhale 2 puffs into the lungs every 4 (four) hours as needed for wheezing or shortness of breath. Take 2 puffs every 4-6 hours if needed 18 g 1   amitriptyline (ELAVIL) 25 MG tablet Take 12.5 tablets by mouth at bedtime.     aspirin EC 81 MG tablet Take 81 mg by mouth daily.     atorvastatin (LIPITOR) 80 MG tablet Take 1 tablet (80 mg total) by mouth daily. 90 tablet 0   busPIRone (BUSPAR) 10 MG tablet Take 10 mg by mouth daily.     cetirizine (ZYRTEC) 10 MG tablet Take 1 tablet (10 mg total) by mouth 2 (two) times daily as needed for allergies (Can use an extra dose during flares). 60 tablet 5   Cholecalciferol 50 MCG (2000 UT) TABS Take 2,000 Units by mouth daily.     clopidogrel (PLAVIX) 75 MG tablet Take 1 tablet (75 mg total) by mouth daily. 90 tablet 3   Dextromethorphan-guaiFENesin (MUCINEX DM) 30-600 MG TB12 Take 2 tablets by mouth 2 (two) times daily as needed (Use if needed during flare ups.). 60 tablet 1   famotidine (PEPCID) 40 MG tablet Take 1 tablet (40 mg total) by mouth 2 (two) times daily. 60 tablet 5    fluticasone-salmeterol (WIXELA INHUB) 250-50 MCG/ACT AEPB 1 inhalations twice a day 60 each 5   ipratropium-albuterol (DUONEB) 0.5-2.5 (3) MG/3ML SOLN Take 3 mLs by nebulization every 4 (four) hours as needed. 150 mL 5   Melatonin 5 MG CHEW Chew 5 mg by mouth at bedtime as needed (Sleep).     metoprolol succinate (TOPROL-XL) 50 MG 24 hr tablet Take 1 tablet (50 mg total) by mouth in the morning and at bedtime. Take with or immediately following a meal. 180 tablet 3   montelukast (SINGULAIR) 10 MG tablet Take 1 tablet (10 mg total) by mouth at bedtime. 30 tablet 5   sacubitril-valsartan (ENTRESTO) 24-26 MG Take 1 tablet by mouth 2 (two) times daily. 60 tablet 11   sertraline (ZOLOFT) 100 MG tablet Take 50 mg by mouth every morning.  No current facility-administered medications for this visit.     Review of Systems  Please see the history of present illness.    All other systems reviewed and are otherwise negative except as noted above.  Physical Exam    Wt Readings from Last 3 Encounters:  09/27/22 255 lb 12.8 oz (116 kg)  07/27/22 248 lb 6.4 oz (112.7 kg)  06/28/22 254 lb (115.2 kg)   VS: Vitals:   09/27/22 1323  BP: 122/72  Pulse: 71  SpO2: 97%  ,Body mass index is 40.06 kg/m.  Constitutional:      Appearance: Healthy appearance. Not in distress.  Neck:     Vascular: JVD normal.  Pulmonary:     Effort: Pulmonary effort is normal.     Breath sounds: No wheezing. No rales. Diminished in the bases Cardiovascular:     Normal rate. Regular rhythm. Normal S1. Normal S2.      Murmurs: There is no murmur.  Edema:    Peripheral edema absent.  Abdominal:     Palpations: Abdomen is soft non tender. There is no hepatomegaly.  Skin:    General: Skin is warm and dry.  Neurological:     General: No focal deficit present.     Mental Status: Alert and oriented to person, place and time.     Cranial Nerves: Cranial nerves are intact.  EKG/LABS/Other Studies Reviewed    ECG  personally reviewed by me today -none completed today   Lab Results  Component Value Date   WBC 12.5 (H) 06/22/2021   HGB 15.2 06/22/2021   HCT 45.0 06/22/2021   MCV 88.8 06/22/2021   PLT 268 06/22/2021   Lab Results  Component Value Date   CREATININE 1.19 06/22/2021   BUN 13 06/22/2021   NA 136 06/22/2021   K 3.7 06/22/2021   CL 103 06/22/2021   CO2 22 06/22/2021   Lab Results  Component Value Date   ALT 34 06/19/2021   AST 46 (H) 06/19/2021   ALKPHOS 90 06/19/2021   BILITOT 1.8 (H) 06/19/2021   Lab Results  Component Value Date   CHOL 192 06/19/2021   HDL 42 06/19/2021   LDLCALC 102 (H) 06/19/2021   TRIG 240 (H) 06/19/2021   CHOLHDL 4.6 06/19/2021    Lab Results  Component Value Date   HGBA1C 6.1 (H) 06/19/2021    Assessment & Plan    1.  HFmrEF: -Patient's most recent 2D echo completed 07/2022 showing reduced EF of 40-45%. -Patient is euvolemic today on examination and reports he is tolerating his current dose of medication without any adverse effects. -We will increase GDMT by adding spironolactone 12.5 mg to his current regimen.  We will also increase Entresto to 49/51 mg twice daily -Continue Toprol-XL 50 mg daily, CKD-Patient was advised to follow a low-sodium heart healthy diet and abstain from excess salt in his diet. -Low sodium diet, fluid restriction <2L, and daily weights encouraged. Educated to contact our office for weight gain of 2 lbs overnight or 5 lbs in one week.    2.  Coronary artery disease: -s/p STEMI in 2022 with cardiac arrest and found to have 99% proximal high-grade stenosis in the LAD treated with DES x1 -Today patient reports that he has not had any recurrence of arm pain or chest pain since his previous visit. -Continue GDMT with ASA 81 mg, Lipitor 80 mg, Toprol XL 50 mg, Plavix 75 mg -Continue heart healthy low-sodium diet.  3.  Essential hypertension: -Patient's  blood pressure today was 122/72 -Continue Toprol-XL 50 mg   4.   Hyperlipidemia: -Last LDL was 28 on 09/27/2022 -Continue atorvastatin as noted above   5.  Obesity: Patient's BMI is BMI 40.10 kg/m  -Secondary prevention discussed today and physical activity encouraged at least 150 minutes/week  Disposition: Follow-up with Lesleigh Noe, MD (Inactive) or APP in 5 months   Medication Adjustments/Labs and Tests Ordered: Current medicines are reviewed at length with the patient today.  Concerns regarding medicines are outlined above.   Signed, Napoleon Form, Leodis Rains, NP 09/27/2022, 1:40 PM [facility]  Note:  This document was prepared using Dragon voice recognition software and may include unintentional dictation errors.

## 2022-09-27 ENCOUNTER — Ambulatory Visit: Payer: No Typology Code available for payment source | Attending: Nurse Practitioner | Admitting: Nurse Practitioner

## 2022-09-27 ENCOUNTER — Encounter: Payer: Self-pay | Admitting: Nurse Practitioner

## 2022-09-27 ENCOUNTER — Ambulatory Visit: Payer: No Typology Code available for payment source

## 2022-09-27 VITALS — BP 122/72 | HR 71 | Ht 67.0 in | Wt 255.8 lb

## 2022-09-27 DIAGNOSIS — I25118 Atherosclerotic heart disease of native coronary artery with other forms of angina pectoris: Secondary | ICD-10-CM

## 2022-09-27 DIAGNOSIS — E785 Hyperlipidemia, unspecified: Secondary | ICD-10-CM | POA: Diagnosis not present

## 2022-09-27 DIAGNOSIS — I5022 Chronic systolic (congestive) heart failure: Secondary | ICD-10-CM | POA: Diagnosis not present

## 2022-09-27 DIAGNOSIS — I10 Essential (primary) hypertension: Secondary | ICD-10-CM

## 2022-09-27 MED ORDER — ENTRESTO 49-51 MG PO TABS: 1.0000 | ORAL_TABLET | Freq: Two times a day (BID) | ORAL | 6 refills | Status: DC

## 2022-09-27 MED ORDER — SPIRONOLACTONE 25 MG PO TABS: 12.5000 mg | ORAL_TABLET | Freq: Every day | ORAL | 3 refills | Status: DC

## 2022-09-27 NOTE — Patient Instructions (Addendum)
Medication Instructions:  INCREASE Entresto to 49/51mg  Take 1 tablet twice a day  START Spironolactone 12.5mg  Take 1 tablet once a day  *If you need a refill on your cardiac medications before your next appointment, please call your pharmacy*   Lab Work: [facility] If you have labs (blood work) drawn today and your tests are completely normal, you will receive your results only by: [facility] (if you have MyChart) OR A paper copy in the mail If you have any lab test that is abnormal or we need to change your treatment, we will call you to review the results.   Testing/Procedures: NONE ORDERED   Follow-Up: At [facility], you and your health needs are our priority.  As part of our continuing mission to provide you with exceptional heart care, we have created designated Provider Care Teams.  These Care Teams include your primary Cardiologist (physician) and Advanced Practice Providers (APPs -  Physician Assistants and Nurse Practitioners) who all work together to provide you with the care you need, when you need it.  We recommend signing up for the patient portal called "MyChart".  Sign up information is provided on this After Visit Summary.  MyChart is used to connect with patients for Virtual Visits (Telemedicine).  Patients are able to view lab/test results, encounter notes, upcoming appointments, etc.  Non-urgent messages can be sent to your provider as well.   To learn more about what you can do with MyChart, go to [email or web address].    Your next appointment:   5 month(s)  Provider:   Early Osmond, MD  Other Instructions

## 2022-10-05 ENCOUNTER — Telehealth: Payer: Self-pay | Admitting: Nurse Practitioner

## 2022-10-05 NOTE — Telephone Encounter (Signed)
Returned call to patient.  Explained to him that spironolactone was added as part of the GDMT for heart failure. Explained this is a potassium-sparing diuretic and advised him to take his dose in the mornings to prevent sleep disturbance due to increased urination.  Instructed patient to keep lab appt on 10/11/22 to check kidney function/potassium level.  Patient verbalized understanding.  Patient also expressed interest in [place] for weight loss, he states he had discussed this with Ambrose Pancoast, NP at recent office visit. He would like more information about starting this medication and cost.   Will forward to Pharm D to follow-up with patient to answer questions regarding Ozempic.

## 2022-10-05 NOTE — Telephone Encounter (Signed)
Pt c/o medication issue:  1. Name of Medication: spironolactone (ALDACTONE) 25 MG tablet   2. How are you currently taking this medication (dosage and times per day)? As prescribed   3. Are you having a reaction (difficulty breathing--STAT)? No   4. What is your medication issue? Patient is calling requesting a callback to discuss what this medication is for.

## 2022-10-05 NOTE — Telephone Encounter (Signed)
Returned call to pt and left message on his home #. No answer on cell either.  Pt has prediabetes but not diabetes so insurance will not cover Ozempic. He follows at the [place], don't see any rx insurance scanned in. He would need to reach out to his provider at the [place] to see if they cover other GLPs for weight loss like Wegovy or Zepbound, but I don't believe they do.

## 2022-10-06 NOTE — Telephone Encounter (Signed)
Called pt and relayed prior message. Gave him the names of Wegovy and Zepbound so he can check with his VA if they cover weight loss meds. Pt appreciative for the call.

## 2022-10-11 ENCOUNTER — Ambulatory Visit: Payer: No Typology Code available for payment source | Attending: Nurse Practitioner

## 2022-10-11 DIAGNOSIS — I25118 Atherosclerotic heart disease of native coronary artery with other forms of angina pectoris: Secondary | ICD-10-CM

## 2022-10-11 DIAGNOSIS — I5022 Chronic systolic (congestive) heart failure: Secondary | ICD-10-CM

## 2022-10-12 LAB — BASIC METABOLIC PANEL
BUN/Creatinine Ratio: 10 (ref 10–24)
BUN: 11 mg/dL (ref 8–27)
CO2: 20 mmol/L (ref 20–29)
Calcium: 8.9 mg/dL (ref 8.6–10.2)
Chloride: 106 mmol/L (ref 96–106)
Creatinine, Ser: 1.15 mg/dL (ref 0.76–1.27)
Glucose: 121 mg/dL — ABNORMAL HIGH (ref 70–99)
Potassium: 4.5 mmol/L (ref 3.5–5.2)
Sodium: 141 mmol/L (ref 134–144)
eGFR: 72 mL/min/{1.73_m2} (ref >59)

## 2022-10-25 ENCOUNTER — Other Ambulatory Visit: Payer: Self-pay

## 2022-10-25 MED ORDER — PREDNISONE 10 MG PO TABS: 10.0000 mg | ORAL_TABLET | Freq: Every day | ORAL | 0 refills | Status: AC

## 2022-12-15 ENCOUNTER — Telehealth: Payer: Self-pay | Admitting: Internal Medicine

## 2022-12-15 NOTE — Telephone Encounter (Signed)
Patient stated his VA authorization expired on 2/6 and he will need a new letter to go to the [place] for continuation of care to be back dated as he received a bill for his last office visit.

## 2022-12-26 ENCOUNTER — Other Ambulatory Visit: Payer: Self-pay

## 2022-12-26 ENCOUNTER — Encounter: Payer: Self-pay | Admitting: Allergy and Immunology

## 2022-12-26 ENCOUNTER — Ambulatory Visit (INDEPENDENT_AMBULATORY_CARE_PROVIDER_SITE_OTHER): Payer: Self-pay | Admitting: Allergy and Immunology

## 2022-12-26 VITALS — BP 132/78 | HR 60 | Temp 98.1°F | Resp 16 | Ht 67.0 in | Wt 258.7 lb

## 2022-12-26 DIAGNOSIS — J4540 Moderate persistent asthma, uncomplicated: Secondary | ICD-10-CM

## 2022-12-26 DIAGNOSIS — K219 Gastro-esophageal reflux disease without esophagitis: Secondary | ICD-10-CM

## 2022-12-26 DIAGNOSIS — J3089 Other allergic rhinitis: Secondary | ICD-10-CM

## 2022-12-26 MED ORDER — FLUTICASONE PROPIONATE 50 MCG/ACT NA SUSP: 2.0000 | Freq: Every day | NASAL | 1 refills | Status: DC

## 2022-12-26 MED ORDER — ALBUTEROL SULFATE (2.5 MG/3ML) 0.083% IN NEBU: 2.5000 mg | INHALATION_SOLUTION | RESPIRATORY_TRACT | 1 refills | Status: DC | PRN

## 2022-12-26 MED ORDER — CETIRIZINE HCL 10 MG PO TABS: 10.0000 mg | ORAL_TABLET | Freq: Every day | ORAL | 1 refills | Status: DC | PRN

## 2022-12-26 MED ORDER — FAMOTIDINE 40 MG PO TABS: 40.0000 mg | ORAL_TABLET | Freq: Two times a day (BID) | ORAL | 5 refills | Status: DC

## 2022-12-26 MED ORDER — FLUTICASONE-SALMETEROL 250-50 MCG/ACT IN AEPB: INHALATION_SPRAY | RESPIRATORY_TRACT | 5 refills | Status: DC

## 2022-12-26 MED ORDER — NEBULIZER MASK ADULT MISC: 1.0000 | 1 refills | Status: DC

## 2022-12-26 MED ORDER — ALBUTEROL SULFATE HFA 108 (90 BASE) MCG/ACT IN AERS: 2.0000 | INHALATION_SPRAY | RESPIRATORY_TRACT | 1 refills | Status: DC | PRN

## 2022-12-26 MED ORDER — MONTELUKAST SODIUM 10 MG PO TABS: 10.0000 mg | ORAL_TABLET | Freq: Every day | ORAL | 5 refills | Status: DC

## 2022-12-26 NOTE — Patient Instructions (Signed)
  1.  Continue famotidine 40 mg - 1 tablet twice a day  2.  Continue WIXELA 250 - 1 inhalations 1-2 times a day (empty lungs)  3.  Continue montelukast 10 mg - 1 tablet once a day  4.  If needed:   A.  Albuterol HFA -2 inhalations or nebulizer every 4-6 hours  B.  Mucinex DM-2 tablets twice a day  C.  Antihistamine  D.  Flonase - 1-2 sprays each nostril 1 time per day  E.  Saline nasal gel as needed for nasal dryness  5.  Return to clinic in 6 months or earlier if problem

## 2022-12-26 NOTE — Progress Notes (Unsigned)
[place] - [place] - [place] - [name] - [place]   Follow-up Note  Referring Provider: Anson Fret, MD Primary Provider: Anson Fret, MD Date of Office Visit: 12/26/2022  Subjective:   Harold Warner Cast (DOB: 1961-04-05) is a 62 y.o. male who [facility] in re-evaluation of the following:  HPI: Harold Warner returns to this clinic in valuation of asthma, allergic rhinitis, reflux.  I have not seen him in his clinic since 20 Dec 2021 and he visited with our nurse practitioner on 27 July 2022.  Overall he is done pretty well with his asthma.  He did require prednisone 1 time over the course of the past 6 months for just 10 days at a relatively low dose to treat what appears to be a viral induced flareup of his asthma but otherwise while using Wixela mostly 1 time per day he is doing very well and he can exert himself to the extent that he so desires and he does not require any albuterol.  He had very little problems with his upper airways at this point.  He continues on montelukast and occasionally nasal steroid.  He has not required an antibiotic to treat an episode of sinusitis.  His reflux is under good control as long as he remains on famotidine.  Allergies as of 12/26/2022       Reactions   Adhesive [tape] Rash        Medication List    albuterol (2.5 MG/3ML) 0.083% nebulizer solution Commonly known as: PROVENTIL Take 3 mLs (2.5 mg total) by nebulization every 4 (four) hours as needed for wheezing or shortness of breath.   albuterol 108 (90 Base) MCG/ACT inhaler Commonly known as: VENTOLIN HFA Inhale 2 puffs into the lungs every 4 (four) hours as needed for wheezing or shortness of breath. Take 2 puffs every 4-6 hours if needed   amitriptyline 25 MG tablet Commonly known as: ELAVIL Take 12.5 tablets by mouth at bedtime.   aspirin EC 81 MG tablet Take 81 mg by mouth daily.   atorvastatin 80 MG  tablet Commonly known as: LIPITOR Take 1 tablet (80 mg total) by mouth daily.   busPIRone 10 MG tablet Commonly known as: BUSPAR Take 10 mg by mouth daily.   cetirizine 10 MG tablet Commonly known as: ZYRTEC Take 1 tablet (10 mg total) by mouth 2 (two) times daily as needed for allergies (Can use an extra dose during flares).   Cholecalciferol 50 MCG (2000 UT) Tabs Take 2,000 Units by mouth daily.   clopidogrel 75 MG tablet Commonly known as: PLAVIX Take 1 tablet (75 mg total) by mouth daily.   Entresto 49-51 MG Generic drug: sacubitril-valsartan Take 1 tablet by mouth 2 (two) times daily.   famotidine 40 MG tablet Commonly known as: PEPCID Take 1 tablet (40 mg total) by mouth 2 (two) times daily.   fluticasone-salmeterol 250-50 MCG/ACT Aepb Commonly known as: Wixela Inhub 1 inhalations twice a day   ipratropium-albuterol 0.5-2.5 (3) MG/3ML Soln Commonly known as: DUONEB Take 3 mLs by nebulization every 4 (four) hours as needed.   Melatonin 5 MG Chew Chew 5 mg by mouth at bedtime as needed (Sleep).   metoprolol succinate 50 MG 24 hr tablet Commonly known as: TOPROL-XL Take 1 tablet (50 mg total) by mouth in the morning and at bedtime. Take with or immediately following a meal.   montelukast 10 MG tablet Commonly known as: SINGULAIR Take 1 tablet (10 mg  total) by mouth at bedtime.   Mucinex DM 30-600 MG Tb12 Take 2 tablets by mouth 2 (two) times daily as needed (Use if needed during flare ups.).   sertraline 100 MG tablet Commonly known as: ZOLOFT Take 50 mg by mouth every morning.   spironolactone 25 MG tablet Commonly known as: ALDACTONE Take 0.5 tablets (12.5 mg total) by mouth daily.   tamsulosin 0.4 MG Caps capsule Commonly known as: FLOMAX Take 0.4 mg by mouth daily.    Past Medical History:  Diagnosis Date   Acid reflux    Asthma    Atypical chest pain    Coronary artery disease    Hyperlipidemia    Hypertension    Hypokalemia     Hypopotassemia    Obesity    Recurrent upper respiratory infection (URI)     Past Surgical History:  Procedure Laterality Date   ADENOIDECTOMY     CARDIAC CATHETERIZATION     CORONARY/GRAFT ACUTE MI REVASCULARIZATION N/A 06/19/2021   Procedure: Coronary/Graft Acute MI Revascularization;  Surgeon: Orbie Pyo, MD;  [facility]: [facility];  Service: Cardiovascular;  Laterality: N/A;   HERNIA REPAIR     LEFT HEART CATH AND CORONARY ANGIOGRAPHY N/A 06/19/2021   Procedure: LEFT HEART CATH AND CORONARY ANGIOGRAPHY;  Surgeon: Orbie Pyo, MD;  Location: [facility];  Service: Cardiovascular;  Laterality: N/A;    Review of systems negative except as noted in HPI / PMHx or noted below:  Review of Systems  Constitutional: Negative.   HENT: Negative.    Eyes: Negative.   Respiratory: Negative.    Cardiovascular: Negative.   Gastrointestinal: Negative.   Genitourinary: Negative.   Musculoskeletal: Negative.   Skin: Negative.   Neurological: Negative.   Endo/Heme/Allergies: Negative.   Psychiatric/Behavioral: Negative.       Objective:   Vitals:   12/26/22 1519  BP: 132/78  Pulse: 60  Resp: 16  Temp: 98.1 F (36.7 C)  SpO2: 95%   Height: 5\' 7"  (170.2 cm)  Weight: 258 lb 11.2 oz (117.3 kg)   Physical Exam Constitutional:      Appearance: He is not diaphoretic.  HENT:     Head: Normocephalic.     Right Ear: Tympanic membrane, ear canal and external ear normal.     Left Ear: Tympanic membrane, ear canal and external ear normal.     Nose: Nose normal. No mucosal edema or rhinorrhea.     Mouth/Throat:     Pharynx: Uvula midline. No oropharyngeal exudate.  Eyes:     Conjunctiva/sclera: Conjunctivae normal.  Neck:     Thyroid: No thyromegaly.     Trachea: Trachea normal. No tracheal tenderness or tracheal deviation.  Cardiovascular:     Rate and Rhythm: Normal rate and regular rhythm.     Heart sounds: Normal heart sounds, S1 normal and S2 normal.  No murmur heard. Pulmonary:     Effort: No respiratory distress.     Breath sounds: Normal breath sounds. No stridor. No wheezing or rales.  Lymphadenopathy:     Head:     Right side of head: No tonsillar adenopathy.     Left side of head: No tonsillar adenopathy.     Cervical: No cervical adenopathy.  Skin:    Findings: No erythema or rash.     Nails: There is no clubbing.  Neurological:     Mental Status: He is alert.     Diagnostics:    Spirometry was performed and demonstrated  an FEV1 of 2.75 at 88 % of predicted.  Assessment and Plan:   1. Asthma, moderate persistent, well-controlled   2. Other allergic rhinitis   3. Gastroesophageal reflux disease, unspecified whether esophagitis present    1.  Continue famotidine 40 mg - 1 tablet twice a day  2.  Continue WIXELA 250 - 1 inhalations 1-2 times a day (empty lungs)  3.  Continue montelukast 10 mg - 1 tablet once a day  4.  If needed:   A.  Albuterol HFA -2 inhalations or nebulizer every 4-6 hours  B.  Mucinex DM-2 tablets twice a day  C.  Antihistamine  D.  Flonase - 1-2 sprays each nostril 1 time per day  E.  Saline nasal gel as needed for nasal dryness  5.  Return to clinic in 6 months or earlier if problem  Harold Warner is really doing very well at this point in time regarding his respiratory tract and his reflux on his current plan of anti-inflammatory medications for his airway and a proton pump inhibitor.  He will remain on this plan and we will see him back in this clinic in 6 months or earlier if there is a problem.   Laurette Schimke, MD Allergy / Immunology [facility]

## 2022-12-27 ENCOUNTER — Encounter: Payer: Self-pay | Admitting: Allergy and Immunology

## 2022-12-28 ENCOUNTER — Telehealth: Payer: Self-pay

## 2022-12-28 NOTE — Telephone Encounter (Signed)
Fax received from [facility] - DOB Verified - Patient does not have authorization for community care. Please send prescription to an outside non-VA pharmacy.  Called patient - DOB/Pharmacy verified - advised patient of the above notation.  Patient stated he does not need any medication refills at this time. Patient stated [facility] would be the pharmacy if he ever needs refill.

## 2023-01-01 ENCOUNTER — Telehealth: Payer: Self-pay | Admitting: Internal Medicine

## 2023-01-01 MED ORDER — METOPROLOL SUCCINATE ER 50 MG PO TB24: 50.0000 mg | ORAL_TABLET | Freq: Two times a day (BID) | ORAL | 3 refills | Status: DC

## 2023-01-01 NOTE — Telephone Encounter (Signed)
*  STAT* If patient is at the pharmacy, call can be transferred to refill team.   1. Which medications need to be refilled? (please list name of each medication and dose if known) metoprolol succinate (TOPROL-XL) 50 MG 24 hr tablet   2. Which pharmacy/location (including street and city if local pharmacy) is medication to be sent to? [facility] - [place], [place] - [address]   3. Do they need a 30 day or 90 day supply? 90  Patient only have two pills left

## 2023-01-01 NOTE — Telephone Encounter (Signed)
Refill has been sent in.  

## 2023-02-09 ENCOUNTER — Telehealth: Payer: Self-pay | Admitting: Internal Medicine

## 2023-02-09 NOTE — Telephone Encounter (Signed)
I called the patient and he said that he is feeling fine and can wait until his next appointment in a few weeks with Dr. Lynnette Caffey. He called to discuss his mother's symptoms.

## 2023-02-09 NOTE — Telephone Encounter (Signed)
Pt c/o Shortness Of Breath: STAT if SOB developed within the last 24 hours or pt is noticeably SOB on the phone  1. Are you currently SOB (can you hear that pt is SOB on the phone)? No   2. How long have you been experiencing SOB? Months, but states it is getting worse   3. Are you SOB when sitting or when up moving around? Both   4. Are you currently experiencing any other symptoms? Lightheadedness, dizzy, nervous, clammy when he gets up

## 2023-02-25 NOTE — Progress Notes (Unsigned)
Cardiology Office Note:    Date:  02/25/2023   ID:  Harold Warner, DOB [birth year], MRN [id number]  PCP:  Anson Fret, MD   [facility] Providers Cardiologist:  Alverda Skeans, MD Referring MD: Anson Fret, MD   Chief Complaint/Reason for Referral: Cardiology follow-up  ASSESSMENT:    1. Coronary artery disease of native artery of native heart with stable angina pectoris (HCC)   2. Heart failure with mildly reduced ejection fraction (HFmrEF) (HCC)   3. Hyperlipidemia LDL goal <70   4. Essential hypertension   5. BMI 38.0-38.9,adult   6. CKD (chronic kidney disease), stage II     PLAN:    In order of problems listed above: 1.  Coronary artery disease: Stop aspirin and continue Plavix monotherapy indefinitely. 2.  Cardiomyopathy: Continue Toprol, Entresto, and spironolactone.  Start Jardiance 10 mg daily. 3.  Hyperlipidemia: Check lipid panel, LFTs, LP(a) today.  Given history of MI goal LDL is 55 regardless of LP(a) level. 4.  Hypertension:*** 5.  Elevated BMI: We will check hemoglobin A1c and if above 6.5 will refer to pharmacy for recommendations regarding GLP-1 receptor antagonism. 6.  CKD stage II: Continue Entresto and start Jardiance 10 mg daily.         {Are you ordering a CV Procedure (e.g. stress test, cath, DCCV, TEE, etc)?   Press F2        :[id number]}   Dispo:  No follow-ups on file.      Medication Adjustments/Labs and Tests Ordered: Current medicines are reviewed at length with the patient today.  Concerns regarding medicines are outlined above.  The following changes have been made:  {PLAN; NO CHANGE:13088:s}   Labs/tests ordered: No orders of the defined types were placed in this encounter.   Medication Changes: No orders of the defined types were placed in this encounter.   Current medicines are reviewed at length with the patient today.  The patient {ACTIONS; HAS/DOES NOT HAVE:19233} concerns regarding medicines.  History  of Present Illness:    FOCUSED PROBLEM LIST:   1.  Anterior ST elevation myocardial infarction status post PCI proximal LAD complicated with resuscitated VF arrest 2022 2.  Hypertension 3.  Hyperlipidemia 4.  BMI of 38 5.  CKD stage II 6.  OSA on CPAP 7.  Moderate cardiomyopathy on echocardiogram J ejection fraction 40 to 45% January 2024  The patient is a 62 y.o. male with the indicated medical history here for routine cardiology follow-up.    The patient was last seen in February of this year in response to an echocardiogram done in December due to shortness of breath.  This demonstrated ejection fraction of 40 to 45%.  The patient's Brilinta was switched to clopidogrel due to shortness of breath; Sherryll Burger was started and eventually uptitrated and spironolactone was also initiated.  A repeat echocardiogram in June of this year demonstrated normalized LV function.       Previous Medical History: Past Medical History:  Diagnosis Date   Acid reflux    Asthma    Atypical chest pain    Coronary artery disease    Hyperlipidemia    Hypertension    Hypokalemia    Hypopotassemia    Obesity    Recurrent upper respiratory infection (URI)      Current Medications: No outpatient medications have been marked as taking for the 03/01/23 encounter (Appointment) with Orbie Pyo, MD.     Allergies:    Adhesive [tape]   Social History:  Social History   Tobacco Use   Smoking status: Never   Smokeless tobacco: Never  Substance Use Topics   Alcohol use: No   Drug use: No     Family Hx: Family History  Problem Relation Age of Onset   Hypertension Mother    Allergic rhinitis Neg Hx    Asthma Neg Hx    Eczema Neg Hx    Urticaria Neg Hx    Angioedema Neg Hx      Review of Systems:   Please see the history of present illness.    All other systems reviewed and are negative.     EKGs/Labs/Other Test Reviewed:    EKG:    EKG Interpretation Date/Time:    Ventricular  Rate:    PR Interval:    QRS Duration:    QT Interval:    QTC Calculation:   R Axis:      Text Interpretation:           Prior CV studies reviewed:  Cardiac Studies & Procedures   CARDIAC CATHETERIZATION  CARDIAC CATHETERIZATION 06/19/2021  Narrative   Mid LM lesion is 20% stenosed.   Prox LAD lesion is 99% stenosed.   A stent was successfully placed.   Post intervention, there is a 0% residual stenosis.   LV end diastolic pressure is severely elevated.  1.  High-grade proximal LAD lesion treated with 1 drug-eluting stent. 2.  Elevated LVEDP of 27 mmHg.  Recommendations: DAPT for 1 year, continue amiodarone until the morning and start beta-blocker and high-dose atorvastatin.  An echocardiogram will be performed in the morning.  Telemetric monitoring for 48 hours.  Findings Coronary Findings Diagnostic  Dominance: Right  Left Main Mid LM lesion is 20% stenosed.  Left Anterior Descending Prox LAD lesion is 99% stenosed.  Intervention  Prox LAD lesion Stent A stent was successfully placed. Post-Intervention Lesion Assessment The intervention was successful. Pre-interventional TIMI flow is 3. Post-intervention TIMI flow is 3. No complications occurred at this lesion. There is a 0% residual stenosis post intervention.   STRESS TESTS  MYOCARDIAL PERFUSION IMAGING 07/26/2022  Narrative   Findings are consistent with prior myocardial infarction with peri-infarct ischemia in the LAD territory. The study is high risk.   No ST deviation was noted.   LV perfusion is abnormal. Defect 1: There is a large defect with severe reduction in uptake present in the apical to basal anteroseptal, inferoseptal, septal and apex location(s) that is partially reversible. There is abnormal wall motion in the defect area. Consistent with peri-infarct ischemia.   Left ventricular function is abnormal. Global function is moderately reduced. Nuclear stress EF: 31 %. The left ventricular  ejection fraction is moderately decreased (30-44%). End diastolic cavity size is moderately enlarged.   Prior study available for comparison from 10/18/2012.   ECHOCARDIOGRAM  ECHOCARDIOGRAM COMPLETE 08/23/2022  Narrative ECHOCARDIOGRAM REPORT    Patient Name:   Harold Warner [facility] Date of Exam: 08/23/2022 Medical Rec #:  [id number]          Height:       67.0 in Accession #:    [id number]         Weight:       248.4 lb Date of Birth:  12-03-1960          BSA:          2.217 m Patient Age:    61 years           BP:  131/77 mmHg Patient Gender: M                  HR:           59 bpm. Exam Location:  [address]  Procedure: 2D Echo, Cardiac Doppler and Color Doppler  Indications:    I50.22 CHF  History:        Patient has prior history of Echocardiogram examinations, most recent 06/20/2021. STEMI; Risk Factors:Hypertension and HLD.  Sonographer:    Clearence Ped RCS Referring Phys: [phone] Barry Dienes [facility]  IMPRESSIONS   1. Left ventricular ejection fraction, by estimation, is 40 to 45%. The left ventricle has mildly decreased function. The left ventricle demonstrates regional wall motion abnormalities (see scoring diagram/findings for description). Left ventricular diastolic parameters are indeterminate. Difficult to visualize walls, even with echo contrast. Overall there appears to be mild to moderate global hypokinesis, with more severe hypokinesis of the mid to distal anterior, anteroseptal, and inferoseptal wall. 2. Right ventricular systolic function was not well visualized. The right ventricular size is not well visualized. 3. The mitral valve was not well visualized. No evidence of mitral valve regurgitation. No evidence of mitral stenosis. 4. The aortic valve was not well visualized. Aortic valve regurgitation is not visualized. No aortic stenosis is present. 5. The inferior vena cava is normal in size with <50% respiratory variability, suggesting right atrial pressure of  8 mmHg.  Comparison(s): Prior images reviewed side by side. Changes from prior study are noted. Prior echo images also very difficult. Visually EF appears slightly better on prior echo, current EF ~45% with wall motion pattern as noted.  Conclusion(s)/Recommendation(s): Very difficult windows, even with echo contrast. EF appears better on short axis imaging, but these views are limited. Long axis images, both parasternal and apical, suggest EF 40-45% with wall motion pattern as noted.  FINDINGS Left Ventricle: Left ventricular ejection fraction, by estimation, is 40 to 45%. The left ventricle has mildly decreased function. The left ventricle demonstrates regional wall motion abnormalities. Definity contrast agent was given IV to delineate the left ventricular endocardial borders. The left ventricular internal cavity size was normal in size. Suboptimal image quality limits for assessment of left ventricular hypertrophy. Left ventricular diastolic parameters are indeterminate.   LV Wall Scoring: There is diffuse hypokinesis. Difficult to visualize walls, even with echo contrast. Overall there appears to be mild to moderate global hypokinesis, with more severe hypokinesis of the mid to distal anterior, anteroseptal, and inferoseptal wall.  Right Ventricle: The right ventricular size is not well visualized. Right vetricular wall thickness was not well visualized. Right ventricular systolic function was not well visualized.  Left Atrium: Left atrial size was not well visualized.  Right Atrium: Right atrial size was not well visualized.  Pericardium: There is no evidence of pericardial effusion.  Mitral Valve: The mitral valve was not well visualized. No evidence of mitral valve regurgitation. No evidence of mitral valve stenosis.  Tricuspid Valve: The tricuspid valve is not well visualized. Tricuspid valve regurgitation is not demonstrated. No evidence of tricuspid stenosis.  Aortic Valve:  The aortic valve was not well visualized. Aortic valve regurgitation is not visualized. No aortic stenosis is present.  Pulmonic Valve: The pulmonic valve was not well visualized. Pulmonic valve regurgitation is not visualized. No evidence of pulmonic stenosis.  Aorta: The ascending aorta was not well visualized and the aortic root was not well visualized.  Venous: The inferior vena cava is normal in size with less than 50% respiratory  variability, suggesting right atrial pressure of 8 mmHg.  IAS/Shunts: The interatrial septum was not well visualized.   LEFT VENTRICLE PLAX 2D LVIDd:         4.10 cm   Diastology LVIDs:         2.70 cm   LV e' medial:    7.51 cm/s LV PW:         1.00 cm   LV E/e' medial:  14.2 LV IVS:        1.30 cm   LV e' lateral:   8.49 cm/s LVOT diam:     1.80 cm   LV E/e' lateral: 12.6 LV SV:         55 LV SV Index:   25 LVOT Area:     2.54 cm   RIGHT VENTRICLE RV Basal diam:  3.40 cm RV S prime:     9.36 cm/s TAPSE (M-mode): 2.6 cm  LEFT ATRIUM             Index        RIGHT ATRIUM           Index LA diam:        3.30 cm 1.49 cm/m   RA Area:     13.30 cm LA Vol (A2C):   74.5 ml 33.60 ml/m  RA Volume:   33.90 ml  15.29 ml/m LA Vol (A4C):   70.3 ml 31.70 ml/m LA Biplane Vol: 76.0 ml 34.27 ml/m AORTIC VALVE LVOT Vmax:   96.40 cm/s LVOT Vmean:  63.900 cm/s LVOT VTI:    0.215 m  AORTA Ao Root diam: 2.60 cm  MITRAL VALVE MV Area (PHT):              SHUNTS MV Decel Time:              Systemic VTI:  0.22 m MV E velocity: 107.00 cm/s  Systemic Diam: 1.80 cm MV A velocity: 86.50 cm/s MV E/A ratio:  1.24  Jodelle Red MD Electronically signed by Jodelle Red MD Signature Date/Time: 08/23/2022/5:09:08 PM    Final             Other studies Reviewed: Review of the additional studies/records demonstrates: CT chest 2014 without aortic atherosclerosis  Recent Labs: 10/11/2022: BUN 11; Creatinine, Ser 1.15; Potassium 4.5; Sodium  141   Lipid Panel    Component Value Date/Time   CHOL 192 06/19/2021 2138   TRIG 240 (H) 06/19/2021 2138   HDL 42 06/19/2021 2138   CHOLHDL 4.6 06/19/2021 2138   VLDL 48 (H) 06/19/2021 2138   LDLCALC 102 (H) 06/19/2021 2138    Risk Assessment/Calculations:    {Does this patient have ATRIAL FIBRILLATION?:[phone]}      No BP recorded.  {Refresh Note OR Click here to enter BP  :1}***    Physical Exam:    VS:  There were no vitals taken for this visit.   Wt Readings from Last 3 Encounters:  12/26/22 258 lb 11.2 oz (117.3 kg)  09/27/22 255 lb 12.8 oz (116 kg)  07/27/22 248 lb 6.4 oz (112.7 kg)    No BP recorded.  {Refresh Note OR Click here to enter BP  :1}***    GENERAL:  No apparent distress, AOx3 HEENT:  No carotid bruits, +2 carotid impulses, no scleral icterus CAR: RRR Irregular RR*** no murmurs***, gallops, rubs, or thrills RES:  Clear to auscultation bilaterally ABD:  Soft, nontender, nondistended, positive bowel sounds x 4 VASC:  +2 radial pulses, +  2 carotid pulses, palpable pedal pulses NEURO:  CN 2-12 grossly intact; motor and sensory grossly intact PSYCH:  No active depression or anxiety EXT:  No edema, ecchymosis, or cyanosis  Signed, Orbie Pyo, MD  02/25/2023 10:41 AM    [facility] [address] [place], [place], [place]  [address] Phone: [phone]; Fax: [phone]   Note:  This document was prepared using Dragon voice recognition software and may include unintentional dictation errors.

## 2023-03-01 ENCOUNTER — Ambulatory Visit: Payer: No Typology Code available for payment source | Attending: Internal Medicine | Admitting: Internal Medicine

## 2023-03-01 ENCOUNTER — Encounter: Payer: Self-pay | Admitting: Internal Medicine

## 2023-03-01 VITALS — BP 102/64 | HR 60 | Ht 67.0 in | Wt 261.4 lb

## 2023-03-01 DIAGNOSIS — E785 Hyperlipidemia, unspecified: Secondary | ICD-10-CM | POA: Diagnosis not present

## 2023-03-01 DIAGNOSIS — I10 Essential (primary) hypertension: Secondary | ICD-10-CM

## 2023-03-01 DIAGNOSIS — N182 Chronic kidney disease, stage 2 (mild): Secondary | ICD-10-CM

## 2023-03-01 DIAGNOSIS — I25118 Atherosclerotic heart disease of native coronary artery with other forms of angina pectoris: Secondary | ICD-10-CM

## 2023-03-01 DIAGNOSIS — Z6838 Body mass index (BMI) 38.0-38.9, adult: Secondary | ICD-10-CM

## 2023-03-01 DIAGNOSIS — I5022 Chronic systolic (congestive) heart failure: Secondary | ICD-10-CM | POA: Diagnosis not present

## 2023-03-01 MED ORDER — EMPAGLIFLOZIN 10 MG PO TABS: 10.0000 mg | ORAL_TABLET | Freq: Every day | ORAL | 5 refills | Status: DC

## 2023-03-01 MED ORDER — EMPAGLIFLOZIN 10 MG PO TABS: 10.0000 mg | ORAL_TABLET | Freq: Every day | ORAL | 0 refills | Status: DC

## 2023-03-01 MED ORDER — METOPROLOL SUCCINATE ER 50 MG PO TB24: 50.0000 mg | ORAL_TABLET | Freq: Every day | ORAL | 3 refills | Status: AC

## 2023-03-01 NOTE — Patient Instructions (Signed)
Medication Instructions:  Your physician has recommended you make the following change in your medication:  1.) change your Toprol XL 50 mg to ONCE A DAY AT BEDTIME 2.) stop aspirin 3.) start Jardiance 10 mg ONCE A DAY BEFORE BREAKFAST  *If you need a refill on your cardiac medications before your next appointment, please call your pharmacy*   Lab Work: Today: lipids/liver function/Lp(a)/HgA1c  If you have labs (blood work) drawn today and your tests are completely normal, you will receive your results only by: MyChart Message (if you have MyChart) OR A paper copy in the mail If you have any lab test that is abnormal or we need to change your treatment, we will call you to review the results.   Testing/Procedures: none   Follow-Up: At [facility], you and your health needs are our priority.  As part of our continuing mission to provide you with exceptional heart care, we have created designated Provider Care Teams.  These Care Teams include your primary Cardiologist (physician) and Advanced Practice Providers (APPs -  Physician Assistants and Nurse Practitioners) who all work together to provide you with the care you need, when you need it.  We recommend signing up for the patient portal called "MyChart".  Sign up information is provided on this After Visit Summary.  MyChart is used to connect with patients for Virtual Visits (Telemedicine).  Patients are able to view lab/test results, encounter notes, upcoming appointments, etc.  Non-urgent messages can be sent to your provider as well.   To learn more about what you can do with MyChart, go to [email or web address].    Your next appointment:   6 month(s)  Provider:   Alverda Skeans, MD

## 2023-03-02 LAB — LIPID PANEL
Cholesterol, Total: 104 mg/dL (ref 100–199)
Triglycerides: 142 mg/dL (ref 0–149)
VLDL Cholesterol Cal: 25 mg/dL (ref 5–40)

## 2023-03-02 LAB — HEMOGLOBIN A1C
Est. average glucose Bld gHb Est-mCnc: 131 mg/dL
Hgb A1c MFr Bld: 6.2 % — ABNORMAL HIGH (ref 4.8–5.6)

## 2023-03-02 LAB — HEPATIC FUNCTION PANEL
ALT: 17 IU/L (ref 0–44)
Bilirubin, Direct: 0.32 mg/dL (ref 0.00–0.40)

## 2023-03-02 LAB — LIPOPROTEIN A (LPA)

## 2023-03-03 LAB — HEPATIC FUNCTION PANEL
AST: 23 IU/L (ref 0–40)
Albumin: 4.4 g/dL (ref 3.9–4.9)
Alkaline Phosphatase: 143 IU/L — ABNORMAL HIGH (ref 44–121)
Bilirubin Total: 1.4 mg/dL — ABNORMAL HIGH (ref 0.0–1.2)
Total Protein: 6.7 g/dL (ref 6.0–8.5)

## 2023-03-03 LAB — LIPID PANEL
Chol/HDL Ratio: 3.1 ratio (ref 0.0–5.0)
HDL: 34 mg/dL — ABNORMAL LOW (ref >39)
LDL Chol Calc (NIH): 45 mg/dL (ref 0–99)

## 2023-03-04 IMAGING — DX DG CHEST 1V PORT
1 series · 1 of 1 positions shown · non-contrast
Comparison: Chest x-ray 10/17/2012, CT chest 10/17/2012

CLINICAL DATA: Chest pain

EXAM:
PORTABLE CHEST 1 VIEW.  Patient is rotated.

[chest ap]
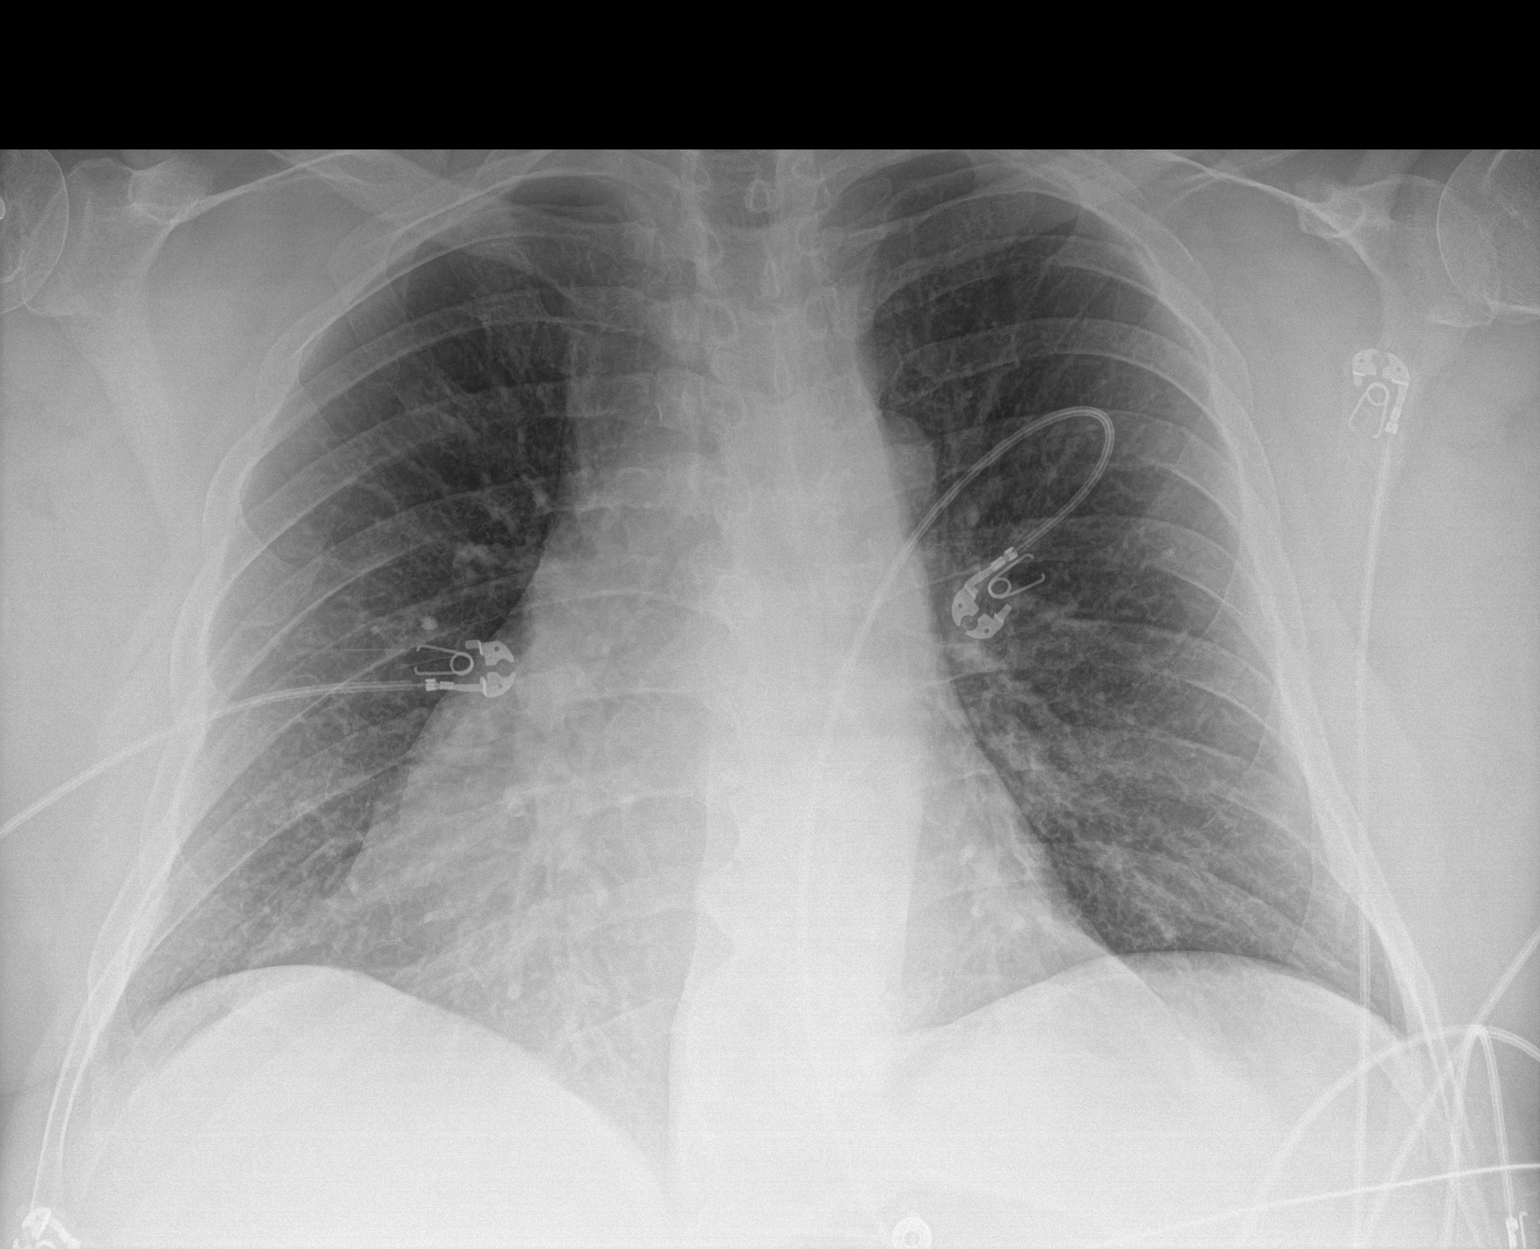

[1 of 1 positions shown; findings below may reference images not displayed]

FINDINGS: The heart and mediastinal contours are unchanged.

No focal consolidation. No pulmonary edema. No pleural effusion. No
pneumothorax.

No acute osseous abnormality.
IMPRESSION: No active disease.

## 2023-03-06 ENCOUNTER — Other Ambulatory Visit: Payer: Self-pay | Admitting: *Deleted

## 2023-03-06 DIAGNOSIS — E785 Hyperlipidemia, unspecified: Secondary | ICD-10-CM

## 2023-03-06 NOTE — Progress Notes (Signed)
Repeat LFTs in one month.  Order placed.

## 2023-05-08 ENCOUNTER — Encounter: Payer: Self-pay | Admitting: Allergy and Immunology

## 2023-05-08 ENCOUNTER — Ambulatory Visit (INDEPENDENT_AMBULATORY_CARE_PROVIDER_SITE_OTHER): Payer: Self-pay | Admitting: Allergy and Immunology

## 2023-05-08 ENCOUNTER — Other Ambulatory Visit: Payer: Self-pay

## 2023-05-08 VITALS — BP 116/64 | HR 59 | Temp 97.2°F | Resp 17

## 2023-05-08 DIAGNOSIS — J3089 Other allergic rhinitis: Secondary | ICD-10-CM

## 2023-05-08 DIAGNOSIS — K219 Gastro-esophageal reflux disease without esophagitis: Secondary | ICD-10-CM

## 2023-05-08 DIAGNOSIS — J4541 Moderate persistent asthma with (acute) exacerbation: Secondary | ICD-10-CM

## 2023-05-08 MED ORDER — FLUTICASONE-SALMETEROL 250-50 MCG/ACT IN AEPB: INHALATION_SPRAY | RESPIRATORY_TRACT | 5 refills | Status: DC

## 2023-05-08 MED ORDER — FLUTICASONE PROPIONATE 50 MCG/ACT NA SUSP: 2.0000 | Freq: Every day | NASAL | 1 refills | Status: DC

## 2023-05-08 MED ORDER — METHYLPREDNISOLONE ACETATE 40 MG/ML IJ SUSP
40.0000 mg | Freq: Once | INTRAMUSCULAR | Status: AC
Start: 2023-05-08 — End: 2023-05-08
  Administered 2023-05-08: 40 mg via INTRAMUSCULAR

## 2023-05-08 MED ORDER — ALBUTEROL SULFATE HFA 108 (90 BASE) MCG/ACT IN AERS: 2.0000 | INHALATION_SPRAY | RESPIRATORY_TRACT | 1 refills | Status: DC | PRN

## 2023-05-08 MED ORDER — FAMOTIDINE 40 MG PO TABS: 40.0000 mg | ORAL_TABLET | Freq: Two times a day (BID) | ORAL | 5 refills | Status: DC

## 2023-05-08 MED ORDER — MONTELUKAST SODIUM 10 MG PO TABS: 10.0000 mg | ORAL_TABLET | Freq: Every day | ORAL | 5 refills | Status: DC

## 2023-05-08 NOTE — Progress Notes (Unsigned)
[place] [facility] [place] [facility] [place] [facility] [name] [facility] [place]   Follow-up Note  Referring Provider: Anson Fret, MD Primary Provider: Anson Fret, MD Date of Office Visit: 05/08/2023  Subjective:   Harold Warner (DOB: Jan 20, 1961) is a 62 y.o. male who [facility] in re-evaluation of the following:  HPI: Harold Warner returns to this clinic in evaluation of asthma, allergic rhinitis, reflux.  I last saw him in this clinic 26 Dec 2022.  He was really doing very well and had very little issues with his nose or his chest while he was using his Wixela mostly 1 time per day and his montelukast.  He rarely uses any short acting bronchodilator.  He did not require systemic steroid to treat asthma or an antibiotic to treat an episode of sinusitis.  And his reflux was under good control.  He was able to taper down his famotidine to just once a day.  He unfortunately had a change in his health status approximately 5 days ago.  He had relatively acute onset of sniffing and snorting and runny nose and some sneezing and some coughing without any fever or associated systemic or constitutional symptoms and without any anosmia or ugly nasal discharge associated with a negative COVID swab.  He is losing sleep because of all of his cough.  He is having regurgitation because of this cough.  Allergies as of 05/08/2023       Reactions   Adhesive [tape] Rash        Medication List    albuterol (2.5 MG/3ML) 0.083% nebulizer solution Commonly known as: PROVENTIL Take 3 mLs (2.5 mg total) by nebulization every 4 (four) hours as needed for wheezing or shortness of breath.   albuterol 108 (90 Base) MCG/ACT inhaler Commonly known as: VENTOLIN HFA Inhale 2 puffs into the lungs every 4 (four) hours as needed for wheezing or shortness of breath. Take 2 puffs every 4-6 hours if needed   amitriptyline 25 MG tablet Commonly known as: ELAVIL Take 12.5 tablets  by mouth at bedtime.   atorvastatin 80 MG tablet Commonly known as: LIPITOR Take 1 tablet (80 mg total) by mouth daily.   busPIRone 10 MG tablet Commonly known as: BUSPAR Take 10 mg by mouth daily.   cetirizine 10 MG tablet Commonly known as: ZYRTEC Take 1 tablet (10 mg total) by mouth daily as needed for allergies (Can use an extra dose during flares).   Cholecalciferol 50 MCG (2000 UT) Tabs Take 2,000 Units by mouth daily.   clopidogrel 75 MG tablet Commonly known as: PLAVIX Take 1 tablet (75 mg total) by mouth daily.   empagliflozin 10 MG Tabs tablet Commonly known as: Jardiance Take 1 tablet (10 mg total) by mouth daily before breakfast.   empagliflozin 10 MG Tabs tablet Commonly known as: Jardiance Take 1 tablet (10 mg total) by mouth daily before breakfast.   Entresto 49-51 MG Generic drug: sacubitril-valsartan Take 1 tablet by mouth 2 (two) times daily.   famotidine 40 MG tablet Commonly known as: PEPCID Take 1 tablet (40 mg total) by mouth 2 (two) times daily.   fluticasone 50 MCG/ACT nasal spray Commonly known as: FLONASE Place 2 sprays into both nostrils daily.   fluticasone-salmeterol 250-50 MCG/ACT Aepb Commonly known as: Wixela Inhub 1 inhalations twice a day   ipratropium-albuterol 0.5-2.5 (3) MG/3ML Soln Commonly known as: DUONEB Take 3 mLs by nebulization every 4 (four) hours as needed.   Melatonin 5 MG  Chew Chew 5 mg by mouth at bedtime as needed (Sleep).   metoprolol succinate 50 MG 24 hr tablet Commonly known as: TOPROL-XL Take 1 tablet (50 mg total) by mouth at bedtime.   montelukast 10 MG tablet Commonly known as: SINGULAIR Take 1 tablet (10 mg total) by mouth at bedtime.   Mucinex DM 30-600 MG Tb12 Take 2 tablets by mouth 2 (two) times daily as needed (Use if needed during flare ups.).   Nebulizer Mask Adult Misc 1 Device by Does not apply route as directed.   sertraline 100 MG tablet Commonly known as: ZOLOFT Take 50 mg by  mouth every morning.   spironolactone 25 MG tablet Commonly known as: ALDACTONE Take 0.5 tablets (12.5 mg total) by mouth daily.   tamsulosin 0.4 MG Caps capsule Commonly known as: FLOMAX Take 0.4 mg by mouth daily.    Past Medical History:  Diagnosis Date   Acid reflux    Asthma    Atypical chest pain    Coronary artery disease    Hyperlipidemia    Hypertension    Hypokalemia    Hypopotassemia    Obesity    Recurrent upper respiratory infection (URI)     Past Surgical History:  Procedure Laterality Date   ADENOIDECTOMY     CARDIAC CATHETERIZATION     CORONARY/GRAFT ACUTE MI REVASCULARIZATION N/A 06/19/2021   Procedure: Coronary/Graft Acute MI Revascularization;  Surgeon: Orbie Pyo, MD;  Location: [facility] IN[facility]ASIVE [facility];  Service: Cardiovascular;  Laterality: N/A;   HERNIA REPAIR     LEFT HEART CATH AND CORONARY ANGIOGRAPHY N/A 06/19/2021   Procedure: LEFT HEART CATH AND CORONARY ANGIOGRAPHY;  Surgeon: Orbie Pyo, MD;  Location: [facility]VAS[facility] CV [facility];  Service: Cardiovascular;  Laterality: N/A;    Review of systems negative except as noted in HPI / PMHx or noted below:  Review of Systems  Constitutional: Negative.   HENT: Negative.    Eyes: Negative.   Respiratory: Negative.    Cardiovascular: Negative.   Gastrointestinal: Negative.   Genitourinary: Negative.   Musculoskeletal: Negative.   Skin: Negative.   Neurological: Negative.   Endo/Heme/Allergies: Negative.   Psychiatric/Behavioral: Negative.       Objective:   Vitals:   05/08/23 0947  BP: 116/64  Pulse: (!) 59  Resp: 17  Temp: (!) 97.2 F (36.2 C)  SpO2: 95%          Physical Exam Constitutional:      Appearance: He is not diaphoretic.     Comments: Coughing, nasal voice  HENT:     Head: Normocephalic.     Right Ear: Tympanic membrane, ear canal and external ear normal.     Left Ear: Tympanic membrane, ear canal and external ear normal.     Nose: Nose normal. No mucosal edema or  rhinorrhea.     Mouth/Throat:     Pharynx: Uvula midline. No oropharyngeal exudate.  Eyes:     Conjunctiva/sclera: Conjunctivae normal.  Neck:     Thyroid: No thyromegaly.     Trachea: Trachea normal. No tracheal tenderness or tracheal deviation.  Cardiovascular:     Rate and Rhythm: Normal rate and regular rhythm.     Heart sounds: Normal heart sounds, S1 normal and S2 normal. No murmur heard. Pulmonary:     Effort: No respiratory distress.     Breath sounds: Normal breath sounds. No stridor. No wheezing or rales.  Lymphadenopathy:     Head:     Right side of  head: No tonsillar adenopathy.     Left side of head: No tonsillar adenopathy.     Cervical: No cervical adenopathy.  Skin:    Findings: No erythema or rash.     Nails: There is no clubbing.  Neurological:     Mental Status: He is alert.     Diagnostics:    Spirometry was performed and demonstrated an FEV1 of 2.86 at 92 % of predicted.  The patient had an Asthma Control Test with the following results: ACT Total Score: 16.    Assessment and Plan:   No diagnosis found.   1.  Continue famotidine 40 mg - 1 tablet 1-2 times per day  2.  Continue WIXELA 250 - 1 inhalations 1-2 times a day (empty lungs)  3.  Continue montelukast 10 mg - 1 tablet once a day  4.  If needed:   A.  Albuterol + Fluticasone 110 -2 inhalations EACH or nebulizer every 4-6 hours  B.  Mucinex DM-2 tablets twice a day  C.  Antihistamine  D.  Flonase - 1-2 sprays each nostril 1 time per day  E.  Saline nasal gel as needed for nasal dryness  5. For this recent episode:   A. Use Wixela 2 times per day  B. Use famotidine 2 times per day  C. Depomedrol 40 mg IM delivered in clinic today  D. Further treatment???  6.  Return to clinic in 6 months or earlier if problem  7. Plan for fall flu vaccine  Harold Warner appears to have a viral respiratory tract infection is giving rise to inflammation of his airway and is setting off his reflux to some  degree.  I have given him a plan where he will consistently use his Wixela twice a day and his famotidine twice a day and I have given him a low-dose of a systemic steroid.  I will assume that he will do well with this treatment over the course of the next 3 to 4 days and if he has a problem thereafter he can contact me for further evaluation and treatment.  Laurette Schimke, MD Allergy / Immunology [facility]

## 2023-05-08 NOTE — Patient Instructions (Signed)
  1.  Continue famotidine 40 mg - 1 tablet 1-2 times per day  2.  Continue WIXELA 250 - 1 inhalations 1-2 times a day (empty lungs)  3.  Continue montelukast 10 mg - 1 tablet once a day  4.  If needed:   A.  Albuterol + Fluticasone 110 -2 inhalations EACH or nebulizer every 4-6 hours  B.  Mucinex DM-2 tablets twice a day  C.  Antihistamine  D.  Flonase - 1-2 sprays each nostril 1 time per day  E.  Saline nasal gel as needed for nasal dryness  5. For this recent episode:   A. Use Wixela 2 times per day  B. Use famotidine 2 times per day  C. Depomedrol 40 mg IM delivered in clinic today  D. Further treatment???  6.  Return to clinic in 6 months or earlier if problem  7. Plan for fall flu vaccine

## 2023-05-09 ENCOUNTER — Other Ambulatory Visit: Payer: Self-pay

## 2023-05-09 MED ORDER — FAMOTIDINE 40 MG PO TABS: 40.0000 mg | ORAL_TABLET | Freq: Two times a day (BID) | ORAL | 5 refills | Status: DC

## 2023-05-09 MED ORDER — ALBUTEROL SULFATE HFA 108 (90 BASE) MCG/ACT IN AERS: 2.0000 | INHALATION_SPRAY | RESPIRATORY_TRACT | 1 refills | Status: DC | PRN

## 2023-05-09 MED ORDER — FLUTICASONE PROPIONATE 50 MCG/ACT NA SUSP: 2.0000 | Freq: Every day | NASAL | 1 refills | Status: DC

## 2023-05-09 MED ORDER — MONTELUKAST SODIUM 10 MG PO TABS: 10.0000 mg | ORAL_TABLET | Freq: Every day | ORAL | 5 refills | Status: DC

## 2023-05-23 ENCOUNTER — Telehealth: Payer: Self-pay | Admitting: Allergy and Immunology

## 2023-05-23 MED ORDER — FLUTICASONE PROPIONATE 50 MCG/ACT NA SUSP: 2.0000 | Freq: Every day | NASAL | 1 refills | Status: DC

## 2023-05-23 MED ORDER — FAMOTIDINE 40 MG PO TABS: 40.0000 mg | ORAL_TABLET | Freq: Two times a day (BID) | ORAL | 5 refills | Status: DC

## 2023-05-23 MED ORDER — ALBUTEROL SULFATE HFA 108 (90 BASE) MCG/ACT IN AERS: 2.0000 | INHALATION_SPRAY | RESPIRATORY_TRACT | 1 refills | Status: DC | PRN

## 2023-05-23 MED ORDER — MONTELUKAST SODIUM 10 MG PO TABS: 10.0000 mg | ORAL_TABLET | Freq: Every day | ORAL | 5 refills | Status: DC

## 2023-05-23 NOTE — Telephone Encounter (Signed)
Harold Warner called in and states he needs Ventolin, Pepcid, Flonase, and Montelukast sent to the [facility] in [place].  Harold Warner stated that all of his prescriptions need to ALWAYS go to [facility] in [place].

## 2023-05-23 NOTE — Telephone Encounter (Signed)
Sent flonase, ventolin, pepcid, montelukast to [facility]

## 2023-06-26 ENCOUNTER — Telehealth: Payer: Self-pay | Admitting: Internal Medicine

## 2023-06-26 MED ORDER — SPIRONOLACTONE 25 MG PO TABS: 12.5000 mg | ORAL_TABLET | Freq: Every day | ORAL | 2 refills | Status: DC

## 2023-06-26 NOTE — Telephone Encounter (Signed)
*  STAT* If patient is at the pharmacy, call can be transferred to refill team.   1. Which medications need to be refilled? (please list name of each medication and dose if known)   spironolactone (ALDACTONE) 25 MG tablet   2. Would you like to learn more about the convenience, safety, & potential cost savings by using the [facility]?   3. Are you open to using the [facility] (Type [facility]. ).  4. Which pharmacy/location (including street and city if local pharmacy) is medication to be sent to?  [facility] - [place], [place] - [address]   5. Do they need a 30 day or 90 day supply?   90 day  Patient stated he has 1 tablet left.  Patient has appointment scheduled on 1/9.

## 2023-06-26 NOTE — Telephone Encounter (Signed)
Pt's medication was sent to pt's pharmacy as requested. Confirmation received.  °

## 2023-07-13 ENCOUNTER — Telehealth: Payer: Self-pay | Admitting: Allergy and Immunology

## 2023-07-13 ENCOUNTER — Other Ambulatory Visit: Payer: Self-pay | Admitting: *Deleted

## 2023-07-13 MED ORDER — FAMOTIDINE 40 MG PO TABS: 40.0000 mg | ORAL_TABLET | Freq: Two times a day (BID) | ORAL | 0 refills | Status: DC

## 2023-07-13 MED ORDER — MONTELUKAST SODIUM 10 MG PO TABS: 10.0000 mg | ORAL_TABLET | Freq: Every day | ORAL | 0 refills | Status: DC

## 2023-07-13 NOTE — Telephone Encounter (Signed)
Rx sent 

## 2023-07-13 NOTE — Telephone Encounter (Signed)
Patient is requesting a refill for Montelukast and Famotidine sent to [facility].

## 2023-07-31 ENCOUNTER — Telehealth: Payer: Self-pay

## 2023-07-31 ENCOUNTER — Ambulatory Visit (INDEPENDENT_AMBULATORY_CARE_PROVIDER_SITE_OTHER): Payer: Self-pay | Admitting: Internal Medicine

## 2023-07-31 ENCOUNTER — Encounter: Payer: Self-pay | Admitting: Internal Medicine

## 2023-07-31 ENCOUNTER — Other Ambulatory Visit: Payer: Self-pay

## 2023-07-31 VITALS — BP 110/70 | HR 60 | Temp 97.6°F | Ht 67.0 in | Wt 260.8 lb

## 2023-07-31 DIAGNOSIS — J069 Acute upper respiratory infection, unspecified: Secondary | ICD-10-CM

## 2023-07-31 DIAGNOSIS — J3089 Other allergic rhinitis: Secondary | ICD-10-CM

## 2023-07-31 DIAGNOSIS — J4540 Moderate persistent asthma, uncomplicated: Secondary | ICD-10-CM

## 2023-07-31 MED ORDER — FLUTICASONE PROPIONATE HFA 110 MCG/ACT IN AERO: INHALATION_SPRAY | RESPIRATORY_TRACT | 12 refills | Status: DC

## 2023-07-31 MED ORDER — ALBUTEROL SULFATE (2.5 MG/3ML) 0.083% IN NEBU: 2.5000 mg | INHALATION_SOLUTION | RESPIRATORY_TRACT | 1 refills | Status: DC | PRN

## 2023-07-31 MED ORDER — IPRATROPIUM-ALBUTEROL 0.5-2.5 (3) MG/3ML IN SOLN: 3.0000 mL | RESPIRATORY_TRACT | 5 refills | Status: AC | PRN

## 2023-07-31 MED ORDER — CETIRIZINE HCL 10 MG PO TABS: 10.0000 mg | ORAL_TABLET | Freq: Every day | ORAL | 1 refills | Status: DC | PRN

## 2023-07-31 MED ORDER — BUDESONIDE 0.5 MG/2ML IN SUSP: RESPIRATORY_TRACT | 0 refills | Status: DC

## 2023-07-31 MED ORDER — FAMOTIDINE 40 MG PO TABS: ORAL_TABLET | ORAL | 0 refills | Status: DC

## 2023-07-31 MED ORDER — MONTELUKAST SODIUM 10 MG PO TABS: 10.0000 mg | ORAL_TABLET | Freq: Every day | ORAL | 1 refills | Status: DC

## 2023-07-31 MED ORDER — FLUTICASONE PROPIONATE 50 MCG/ACT NA SUSP: 2.0000 | Freq: Every day | NASAL | 1 refills | Status: DC

## 2023-07-31 MED ORDER — FLUTICASONE-SALMETEROL 250-50 MCG/ACT IN AEPB: INHALATION_SPRAY | RESPIRATORY_TRACT | 5 refills | Status: DC

## 2023-07-31 MED ORDER — ALBUTEROL SULFATE HFA 108 (90 BASE) MCG/ACT IN AERS: INHALATION_SPRAY | RESPIRATORY_TRACT | 1 refills | Status: DC

## 2023-07-31 MED ORDER — BUDESONIDE 0.5 MG/2ML IN SUSP: RESPIRATORY_TRACT | 1 refills | Status: DC

## 2023-07-31 NOTE — Telephone Encounter (Signed)
[facility] [address]

## 2023-07-31 NOTE — Telephone Encounter (Signed)
Called patient - DOB/NEED DPR - LMOVM stating to contact office with updated pharmacy information - fax received from [facility] advising :  Patient does not have authorization for community care. Please send prescription (s) to an outside non-[facility] pharmacy.  If patient call back - please advise of above notation.

## 2023-07-31 NOTE — Telephone Encounter (Signed)
[facility]

## 2023-07-31 NOTE — Progress Notes (Signed)
FOLLOW UP Date of Service/Encounter:  07/31/23   Subjective:  Harold Warner (DOB: 25-Aug-1960) is a 62 y.o. male who [facility] for follow up for an acute visit.   History obtained from: chart review and patient. Last seen by Dr Lucie Leather 05/08/2023 for uncontrolled asthma with cough likely related to viral URI.  On Wixela, Singulair, Depo in clinic.    Reports about 1 week ago, came down with something.  Feeling tired, cough, congestion, drainage, clear phlegm.  Also some shortness of breath.  Using Wixela 1 puff BID, Singulair and had some leftover prednisone 20mg  from prior that he used for 3 days.  Also using PRN albuterol nebulizer. Thinks he is slowly getting better but not completely resolved.   In terms of upper airway, has restarted Zyrtec and Flonase which are helping with congestion/drainage.  Not on saline rinses.    Past Medical History: Past Medical History:  Diagnosis Date   Acid reflux    Asthma    Atypical chest pain    Coronary artery disease    Hyperlipidemia    Hypertension    Hypokalemia    Hypopotassemia    Obesity    Recurrent upper respiratory infection (URI)     Objective:  BP 110/70 (BP Location: Left Arm, Patient Position: Sitting, Cuff Size: Large)   Pulse 60   Temp 97.6 F (36.4 C)   Ht 5\' 7"  (1.702 m)   Wt 260 lb 12.8 oz (118.3 kg)   SpO2 95%   BMI 40.85 kg/m  Body mass index is 40.85 kg/m. Physical Exam: GEN: alert, well developed HEENT: clear conjunctiva, nose with mild inferior turbinate hypertrophy, pink nasal mucosa, + clear rhinorrhea, no cobblestoning HEART: regular rate and rhythm, no murmur LUNGS: few rhonchi, no wheezes, + coughing with deep inhalation, unlabored respiration SKIN: no rashes or lesions  Spirometry:  Tracings reviewed. His effort: Good reproducible efforts. FVC: 4.10L, 102% predicted  FEV1: 3.12L, 101% predicted FEV1/FVC ratio: 76% Interpretation: Spirometry  consistent with normal pattern.  Please see scanned spirometry results for details.  Assessment:   1. Viral URI with cough   2. Other allergic rhinitis   3. Moderate persistent asthma without complication     Plan/Recommendations:  1.  Continue famotidine 40 mg - 1 tablet 1-2 times per day  2.  Continue WIXELA 250 - 1 inhalations 1-2 times a day (empty lungs)  3.  Continue montelukast 10 mg - 1 tablet once a day  4.  If needed:   A.  Albuterol + Fluticasone 110 -2 inhalations EACH or nebulizer every 4-6 hours  B.  Mucinex DM-2 tablets twice a day  C.  Antihistamine  D.  Flonase - 1-2 sprays each nostril 1 time per day  E.  Saline nasal gel as needed for nasal dryness  5. For this episode Did a short course of leftover prednisone from prior with improvement.  Spirometry today is normal.  Discussed likely viral URI with cough.  Will temporarily add nebulized steroids.  MDI technique discussed.  - Use nasal saline rinses before nose sprays such as with Neilmed Sinus Rinse.  Use distilled water.   - Use Flonase 2 sprays each nostril daily.  Aim upward and outward.  - Use Zyrtec 10mg  daily as needed for runny nose.  - Start Pulmicort 0.5mg  nebulized twice daily for 1-2 weeks.  Continue Wixela 250-16mcg 1 puff twice daily.  - Use Albuterol 1-2 puffs or 1 vial  nebulized every 4-6 hours as needed for shortness of breath/wheezing.   Keep follow up in March 2025.  Alesia Morin, MD [facility]

## 2023-07-31 NOTE — Patient Instructions (Addendum)
1.  Continue famotidine 40 mg - 1 tablet 1-2 times per day  2.  Continue WIXELA 250 - 1 inhalations 1-2 times a day (empty lungs)  3.  Continue montelukast 10 mg - 1 tablet once a day  4.  If needed:   A.  Albuterol + Fluticasone 110 -2 inhalations EACH or nebulizer every 4-6 hours  B.  Mucinex DM-2 tablets twice a day  C.  Antihistamine  D.  Flonase - 1-2 sprays each nostril 1 time per day  E.  Saline nasal gel as needed for nasal dryness  5. For this episode - Use nasal saline rinses before nose sprays such as with Neilmed Sinus Rinse.  Use distilled water.   - Use Flonase 2 sprays each nostril daily.  Aim upward and outward.  - Start Pulmicort 0.5mg  nebulized twice daily for 1-2 weeks.   - Use Albuterol 1-2 puffs or 1 vial nebulized every 4-6 hours as needed for shortness of breath/wheezing.   Keep follow up in March 2025.

## 2023-08-09 ENCOUNTER — Other Ambulatory Visit: Payer: Self-pay

## 2023-08-09 ENCOUNTER — Telehealth: Payer: Self-pay | Admitting: Internal Medicine

## 2023-08-09 MED ORDER — ENTRESTO 49-51 MG PO TABS: 1.0000 | ORAL_TABLET | Freq: Two times a day (BID) | ORAL | 1 refills | Status: DC

## 2023-08-09 NOTE — Addendum Note (Signed)
Addended by: Margaret Pyle D on: 08/09/2023 04:02 PM   Modules accepted: Orders

## 2023-08-09 NOTE — Telephone Encounter (Signed)
*  STAT* If patient is at the pharmacy, call can be transferred to refill team.   1. Which medications need to be refilled? (please list name of each medication and dose if known)   sacubitril-valsartan (ENTRESTO) 49-51 MG   2. Which pharmacy/location (including street and city if local pharmacy) is medication to be sent to? [facility]   3. Do they need a 30 day or 90 day supply? 90

## 2023-08-09 NOTE — Telephone Encounter (Signed)
[facility]. Confirmation from them both

## 2023-08-09 NOTE — Telephone Encounter (Addendum)
Pt's Rx for Sherryll Burger was printed out in Robin Searing, NP name, since Dr. Lynnette Caffey is not in the office, for pt to pick up tomorrow. Leaving it at the front desk [facility]. Robin Searing, NP is out as well, Almira Bar, NP is signing Rx for pt to pick up.

## 2023-08-30 ENCOUNTER — Ambulatory Visit: Payer: No Typology Code available for payment source | Attending: Internal Medicine | Admitting: Internal Medicine

## 2023-08-30 ENCOUNTER — Other Ambulatory Visit: Payer: Self-pay

## 2023-08-30 ENCOUNTER — Encounter: Payer: Self-pay | Admitting: Internal Medicine

## 2023-08-30 VITALS — BP 116/68 | HR 64 | Ht 67.0 in | Wt 259.0 lb

## 2023-08-30 DIAGNOSIS — I25118 Atherosclerotic heart disease of native coronary artery with other forms of angina pectoris: Secondary | ICD-10-CM | POA: Diagnosis not present

## 2023-08-30 DIAGNOSIS — E785 Hyperlipidemia, unspecified: Secondary | ICD-10-CM

## 2023-08-30 DIAGNOSIS — I255 Ischemic cardiomyopathy: Secondary | ICD-10-CM

## 2023-08-30 DIAGNOSIS — N182 Chronic kidney disease, stage 2 (mild): Secondary | ICD-10-CM

## 2023-08-30 DIAGNOSIS — I420 Dilated cardiomyopathy: Secondary | ICD-10-CM

## 2023-08-30 DIAGNOSIS — Z6838 Body mass index (BMI) 38.0-38.9, adult: Secondary | ICD-10-CM

## 2023-08-30 DIAGNOSIS — I10 Essential (primary) hypertension: Secondary | ICD-10-CM

## 2023-08-30 NOTE — Patient Instructions (Signed)
 Medication Instructions:  No changes *If you need a refill on your cardiac medications before your next appointment, please call your pharmacy*   Lab Work: Today; lipids, liver function   Testing/Procedures: none   Follow-Up: At [facility], you and your health needs are our priority.  As part of our continuing mission to provide you with exceptional heart care, we have created designated Provider Care Teams.  These Care Teams include your primary Cardiologist (physician) and Advanced Practice Providers (APPs -  Physician Assistants and Nurse Practitioners) who all work together to provide you with the care you need, when you need it.  We recommend signing up for the patient portal called MyChart.  Sign up information is provided on this After Visit Summary.  MyChart is used to connect with patients for Virtual Visits (Telemedicine).  Patients are able to view lab/test results, encounter notes, upcoming appointments, etc.  Non-urgent messages can be sent to your provider as well.   To learn more about what you can do with MyChart, go to [email or web address].    Your next appointment:   6 month(s)  Provider:   Glendia Ferrier, PA-C

## 2023-08-30 NOTE — Progress Notes (Signed)
 Cardiology Office Note:   Date:  08/30/2023  ID:  Harold Warner, DOB 06/01/1961, MRN [id number] PCP:  Joshua Bruckner, MD  [facility] Providers Cardiologist:  Wendel Haws, MD Referring MD: Joshua Bruckner, MD  Chief Complaint/Reason for Referral: Cardiology follow-up ASSESSMENT:    1. Coronary artery disease of native artery of native heart with stable angina pectoris (HCC)   2. Ischemic congestive cardiomyopathy (HCC)   3. Hyperlipidemia LDL goal <55   4. Essential hypertension   5. BMI 38.0-38.9,adult   6. CKD (chronic kidney disease), stage II     PLAN:   In order of problems listed above: Coronary artery disease: Continue Plavix  monotherapy, statin, and Toprol  given moderate LV dysfunction. Ischemic cardiomyopathy: Continue Jardiance , Toprol , Entresto , and spironolactone . Hyperlipidemia: Check lipid panel and LFTs today. Hypertension:  BP at goal. Elevated BMI: Diet and exercise modification; hemoglobin A1c is not in diabetic range. CKD stage II: Continue Jardiance  and Entresto .            Dispo:  No follow-ups on file.      Medication Adjustments/Labs and Tests Ordered: Current medicines are reviewed at length with the patient today.  Concerns regarding medicines are outlined above.  The following changes have been made:  no change   Labs/tests ordered: Orders Placed This Encounter  Procedures   Lipid panel   Hepatic function panel   EKG 12-Lead    Medication Changes: No orders of the defined types were placed in this encounter.   Current medicines are reviewed at length with the patient today.  The patient does not have concerns regarding medicines.  I spent 34 minutes reviewing all clinical data during and prior to this visit including all relevant imaging studies, laboratories, clinical information from other health systems and prior notes from both Cardiology and other specialties, interviewing the patient, conducting a complete physical  examination, and coordinating care in order to formulate a comprehensive and personalized evaluation and treatment plan.   History of Present Illness:      FOCUSED PROBLEM LIST:   Anterior STEMI 2022 PCI pLAD complicated by VF arrest Hypertension Hyperlipidemia LP(a) 56.7 BMI 38 CKD stage II OSA on CPAP Ischemic cardiomyopathy EF 40 to 45% without valve problems TTE January 2024  7/24:  The patient is a 63 y.o. male with the indicated medical history here for routine cardiology follow-up.    The patient had previously been cared for by Dr. [name].  The patient was last seen in February of this year in response to an echocardiogram done in December due to shortness of breath.  This demonstrated ejection fraction of 40 to 45%.  The patient's Brilinta  was switched to clopidogrel  due to shortness of breath; Entresto  was started and eventually uptitrated and spironolactone  was also initiated.  A repeat echocardiogram in June of this year demonstrated normalized LV function.  The patient is doing well.  He denies any chest pain, exertional angina, or exertional dyspnea.  He has not been as active as he was when he was participating in cardiac rehabilitation.  He is gaining good amount of weight per his own admission.  He however has not required any hospitalizations or emergency room visits for cardiovascular issues.  He is tolerating his atorvastatin  and dual antiplatelet therapy well.  Plan: Stop aspirin  and continue Plavix  monotherapy indefinitely; start Jardiance  10 mg daily, check lipid panel, LFTs, LP(a), change Toprol  to 50 mg at bedtime.  Check hemoglobin A1c.  1/25: In the interim the patient's lipid panel was  at goal.  His LP(a) was relatively low and his hemoglobin A1c was 6.2.  The patient is doing well from a cardiovascular standpoint.  He denies any cardiovascular complaints.  His biggest complaint today is a URI.  He saw his PCP recently with a productive cough.  He was prescribed  Pulmicort  which has helped.  He is otherwise well and without significant complaints today.          Current Medications: Current Meds  Medication Sig   albuterol  (PROVENTIL ) (2.5 MG/3ML) 0.083% nebulizer solution Take 3 mLs (2.5 mg total) by nebulization every 4 (four) hours as needed for wheezing or shortness of breath.   albuterol  (VENTOLIN  HFA) 108 (90 Base) MCG/ACT inhaler 2 inhalations every 4-6 hours as needed for cough, wheeze, shortness of breath and chest tightness.   amitriptyline (ELAVIL) 25 MG tablet Take 12.5 tablets by mouth at bedtime.   atorvastatin  (LIPITOR ) 80 MG tablet Take 1 tablet (80 mg total) by mouth daily.   budesonide  (PULMICORT ) 0.5 MG/2ML nebulizer solution Use Pulmicort  0.5mg  twice daily for 1-2 weeks.   busPIRone (BUSPAR) 10 MG tablet Take 10 mg by mouth daily.   cetirizine  (ZYRTEC ) 10 MG tablet Take 1 tablet (10 mg total) by mouth daily as needed for allergies (Can use an extra dose during flares).   Cholecalciferol 50 MCG (2000 UT) TABS Take 2,000 Units by mouth daily.   clopidogrel  (PLAVIX ) 75 MG tablet Take 1 tablet (75 mg total) by mouth daily.   Dextromethorphan-guaiFENesin  (MUCINEX  DM) 30-600 MG TB12 Take 2 tablets by mouth 2 (two) times daily as needed (Use if needed during flare ups.).   empagliflozin  (JARDIANCE ) 10 MG TABS tablet Take 1 tablet (10 mg total) by mouth daily before breakfast.   empagliflozin  (JARDIANCE ) 10 MG TABS tablet Take 1 tablet (10 mg total) by mouth daily before breakfast.   famotidine  (PEPCID ) 40 MG tablet 1 tablet by mouth 1-2 times a day   fluticasone  (FLONASE ) 50 MCG/ACT nasal spray Place 2 sprays into both nostrils daily.   fluticasone  (FLOVENT  HFA) 110 MCG/ACT inhaler 2 inhalations every 4-6 hours as needed for cough, shortness of breath, wheezing and chest tightness.   fluticasone -salmeterol (WIXELA INHUB) 250-50 MCG/ACT AEPB 1 inhalations 1-2 times a day.   ipratropium-albuterol  (DUONEB) 0.5-2.5 (3) MG/3ML SOLN Take 3 mLs  by nebulization every 4 (four) hours as needed.   Melatonin 5 MG CHEW Chew 5 mg by mouth at bedtime as needed (Sleep).   metoprolol  succinate (TOPROL -XL) 50 MG 24 hr tablet Take 1 tablet (50 mg total) by mouth at bedtime.   montelukast  (SINGULAIR ) 10 MG tablet Take 1 tablet (10 mg total) by mouth at bedtime.   potassium chloride  (KLOR-CON ) 10 MEQ tablet Take by mouth.   Respiratory Therapy Supplies (NEBULIZER MASK ADULT) MISC 1 Device by Does not apply route as directed.   sacubitril -valsartan  (ENTRESTO ) 49-51 MG Take 1 tablet by mouth 2 (two) times daily.   sertraline (ZOLOFT) 100 MG tablet Take 50 mg by mouth every morning.   spironolactone  (ALDACTONE ) 25 MG tablet Take 0.5 tablets (12.5 mg total) by mouth daily.   tamsulosin (FLOMAX) 0.4 MG CAPS capsule Take 0.4 mg by mouth daily.     Review of Systems:   Please see the history of present illness.    All other systems reviewed and are negative.     EKGs/Labs/Other Test Reviewed:   EKG:  EKG performed November 2023 that I personally reviewed demonstrates sinus rhythm with occasional PVCs and anterior T  wave inversions   EKG Interpretation Date/Time:  Thursday August 30 2023 15:56:01 EST Ventricular Rate:  71 PR Interval:  166 QRS Duration:  90 QT Interval:  358 QTC Calculation: 389 R Axis:   39  Text Interpretation: Normal sinus rhythm Nonspecific T wave abnormality When compared with ECG of 20-Jun-2021 06:53, No significant change was found Confirmed by Wendel Haws ([id number]) on 08/30/2023 4:24:59 PM         Risk Assessment/Calculations:          Physical Exam:   VS:  BP 116/68   Pulse 64   Ht 5' 7 (1.702 m)   Wt 259 lb (117.5 kg)   SpO2 97%   BMI 40.57 kg/m        Wt Readings from Last 3 Encounters:  08/30/23 259 lb (117.5 kg)  07/31/23 260 lb 12.8 oz (118.3 kg)  03/01/23 261 lb 6.4 oz (118.6 kg)      GENERAL:  No apparent distress, AOx3 HEENT:  No carotid bruits, +2 carotid impulses, no scleral icterus CAR:  RRR  no murmurs, gallops, rubs, or thrills RES:  Clear to auscultation bilaterally ABD:  Soft, nontender, nondistended, positive bowel sounds x 4 VASC:  +2 radial pulses, +2 carotid pulses NEURO:  CN 2-12 grossly intact; motor and sensory grossly intact PSYCH:  No active depression or anxiety EXT:  No edema, ecchymosis, or cyanosis  Signed, Jahdai Padovano K Cornie Herrington, MD  08/30/2023 5:05 PM    [facility] [address] [place], [place], [place]  [address] Phone: [phone]; Fax: [phone]   Note:  This document was prepared using Dragon voice recognition software and may include unintentional dictation errors.

## 2023-08-31 LAB — LIPID PANEL
Chol/HDL Ratio: 2.5 {ratio} (ref 0.0–5.0)
Cholesterol, Total: 119 mg/dL (ref 100–199)
HDL: 47 mg/dL (ref >39)
LDL Chol Calc (NIH): 56 mg/dL (ref 0–99)
Triglycerides: 82 mg/dL (ref 0–149)
VLDL Cholesterol Cal: 16 mg/dL (ref 5–40)

## 2023-08-31 LAB — HEPATIC FUNCTION PANEL
ALT: 14 [IU]/L (ref 0–44)
AST: 16 [IU]/L (ref 0–40)
Albumin: 4.4 g/dL (ref 3.9–4.9)
Alkaline Phosphatase: 177 [IU]/L — ABNORMAL HIGH (ref 44–121)
Bilirubin Total: 1.4 mg/dL — ABNORMAL HIGH (ref 0.0–1.2)
Bilirubin, Direct: 0.43 mg/dL — ABNORMAL HIGH (ref 0.00–0.40)
Total Protein: 7.1 g/dL (ref 6.0–8.5)

## 2023-09-07 ENCOUNTER — Other Ambulatory Visit: Payer: Self-pay | Admitting: *Deleted

## 2023-09-07 DIAGNOSIS — R748 Abnormal levels of other serum enzymes: Secondary | ICD-10-CM

## 2023-09-07 NOTE — Progress Notes (Signed)
Repeat LFTs in one month due to abnormal results per Dr. Lynnette Caffey.

## 2023-11-06 ENCOUNTER — Ambulatory Visit (INDEPENDENT_AMBULATORY_CARE_PROVIDER_SITE_OTHER): Payer: Self-pay | Admitting: Allergy and Immunology

## 2023-11-06 VITALS — BP 130/60 | HR 62 | Temp 97.9°F | Resp 16 | Ht 66.24 in | Wt 262.9 lb

## 2023-11-06 DIAGNOSIS — J3089 Other allergic rhinitis: Secondary | ICD-10-CM

## 2023-11-06 DIAGNOSIS — J4540 Moderate persistent asthma, uncomplicated: Secondary | ICD-10-CM

## 2023-11-06 DIAGNOSIS — Z872 Personal history of diseases of the skin and subcutaneous tissue: Secondary | ICD-10-CM

## 2023-11-06 DIAGNOSIS — K219 Gastro-esophageal reflux disease without esophagitis: Secondary | ICD-10-CM

## 2023-11-06 MED ORDER — CETIRIZINE HCL 10 MG PO TABS: 10.0000 mg | ORAL_TABLET | Freq: Every day | ORAL | 1 refills | Status: DC | PRN

## 2023-11-06 MED ORDER — FLUTICASONE PROPIONATE 50 MCG/ACT NA SUSP: 2.0000 | Freq: Every day | NASAL | 1 refills | Status: AC

## 2023-11-06 MED ORDER — FLUTICASONE-SALMETEROL 250-50 MCG/ACT IN AEPB: 1.0000 | INHALATION_SPRAY | Freq: Two times a day (BID) | RESPIRATORY_TRACT | 1 refills | Status: DC

## 2023-11-06 MED ORDER — ALBUTEROL SULFATE HFA 108 (90 BASE) MCG/ACT IN AERS: INHALATION_SPRAY | RESPIRATORY_TRACT | 1 refills | Status: DC

## 2023-11-06 MED ORDER — FAMOTIDINE 40 MG PO TABS: 40.0000 mg | ORAL_TABLET | Freq: Two times a day (BID) | ORAL | 1 refills | Status: DC

## 2023-11-06 MED ORDER — ALBUTEROL SULFATE (2.5 MG/3ML) 0.083% IN NEBU: 2.5000 mg | INHALATION_SOLUTION | RESPIRATORY_TRACT | 1 refills | Status: DC | PRN

## 2023-11-06 MED ORDER — FLUTICASONE PROPIONATE 50 MCG/ACT NA SUSP: 2.0000 | Freq: Every day | NASAL | 1 refills | Status: DC

## 2023-11-06 MED ORDER — MONTELUKAST SODIUM 10 MG PO TABS: 10.0000 mg | ORAL_TABLET | Freq: Every evening | ORAL | 1 refills | Status: DC

## 2023-11-06 MED ORDER — FLUTICASONE PROPIONATE HFA 110 MCG/ACT IN AERO: 2.0000 | INHALATION_SPRAY | RESPIRATORY_TRACT | 0 refills | Status: DC | PRN

## 2023-11-06 MED ORDER — FLUTICASONE PROPIONATE HFA 110 MCG/ACT IN AERO: 2.0000 | INHALATION_SPRAY | RESPIRATORY_TRACT | 1 refills | Status: DC | PRN

## 2023-11-06 MED ORDER — CETIRIZINE HCL 10 MG PO TABS
10.0000 mg | ORAL_TABLET | Freq: Every day | ORAL | 1 refills | Status: AC | PRN
Start: 2023-11-06 — End: ?

## 2023-11-06 NOTE — Patient Instructions (Addendum)
  1.  Continue famotidine 40 mg - 1 tablet 1-2 times per day  2.  Continue WIXELA 250 - 1 inhalations 1-2 times a day (empty lungs)  3.  Continue montelukast 10 mg - 1 tablet once a day  4.  If needed:   A.  Albuterol + Fluticasone 110 -2 inhalations TOGETHER or nebulizer every 4-6 hours  B.  Mucinex DM-2 tablets twice a day  C.  Antihistamine  D.  Flonase - 1-2 sprays each nostril 1 time per day  E.  Saline nasal gel as needed for nasal dryness  5. Return to clinic in 6 months or earlier if problem  6. Influenza = Tamiflu. Covid = Paxlovid  7. Keep up with yearly dermatology visits

## 2023-11-06 NOTE — Progress Notes (Unsigned)
 [place] - [place] - [place] - [name] - [name]   Follow-up Note  Referring Provider: Anson Fret, MD Primary Provider: Anson Fret, MD Date of Office Visit: 11/06/2023  Subjective:   Harold Warner (DOB: 1960-08-22) is a 63 y.o. male who [facility] in re-evaluation of the following:  HPI: Harold Warner returns to this clinic in reevaluation of asthma, allergic rhinitis, reflux.  I last saw him in this clinic 05 November 2022.  He did visit with Dr. Allena Katz 31 July 2023 for a viral induced flareup of his respiratory tract disease.  During his last visit with Dr. Allena Katz he did receive a short course of prednisone as well as some nebulized budesonide and everything resolved and he is back to baseline and he has no significant issues with his airway at this point in time and he can exert himself without any problem and he rarely uses a short acting bronchodilator while he maintains therapy with Wixela 1 time per day and montelukast every day.  Occasionally he will use a nasal steroid.    His reflux reflux appears to be be under very good control while using his famotidine mostly just 1 time per day.  Allergies as of 11/06/2023       Reactions   Adhesive [tape] Rash        Medication List    albuterol (2.5 MG/3ML) 0.083% nebulizer solution Commonly known as: PROVENTIL Take 3 mLs (2.5 mg total) by nebulization every 4 (four) hours as needed for wheezing or shortness of breath.   albuterol 108 (90 Base) MCG/ACT inhaler Commonly known as: VENTOLIN HFA 2 inhalations every 4-6 hours as needed for cough, wheeze, shortness of breath and chest tightness.   amitriptyline 25 MG tablet Commonly known as: ELAVIL Take 12.5 tablets by mouth at bedtime.   atorvastatin 80 MG tablet Commonly known as: LIPITOR Take 1 tablet (80 mg total) by mouth daily.   budesonide 0.5 MG/2ML nebulizer solution Commonly known as:  Pulmicort Use Pulmicort 0.5mg  twice daily for 1-2 weeks.   busPIRone 10 MG tablet Commonly known as: BUSPAR Take 10 mg by mouth daily.   cetirizine 10 MG tablet Commonly known as: ZYRTEC Take 1 tablet (10 mg total) by mouth daily as needed for allergies (Can use an extra dose during flares).   Cholecalciferol 50 MCG (2000 UT) Tabs Take 2,000 Units by mouth daily.   clopidogrel 75 MG tablet Commonly known as: PLAVIX Take 1 tablet (75 mg total) by mouth daily.   empagliflozin 10 MG Tabs tablet Commonly known as: Jardiance Take 1 tablet (10 mg total) by mouth daily before breakfast.   Entresto 49-51 MG Generic drug: sacubitril-valsartan Take 1 tablet by mouth 2 (two) times daily.   famotidine 40 MG tablet Commonly known as: PEPCID 1 tablet by mouth 1-2 times a day   fluticasone 110 MCG/ACT inhaler Commonly known as: FLOVENT HFA 2 inhalations every 4-6 hours as needed for cough, shortness of breath, wheezing and chest tightness.   fluticasone 50 MCG/ACT nasal spray Commonly known as: FLONASE Place 2 sprays into both nostrils daily.   fluticasone-salmeterol 250-50 MCG/ACT Aepb Commonly known as: Wixela Inhub 1 inhalations 1-2 times a day.   ipratropium-albuterol 0.5-2.5 (3) MG/3ML Soln Commonly known as: DUONEB Take 3 mLs by nebulization every 4 (four) hours as needed.   Melatonin 5 MG Chew Chew 5 mg by mouth at bedtime as needed (Sleep).   metoprolol succinate 50 MG 24 hr  tablet Commonly known as: TOPROL-XL Take 1 tablet (50 mg total) by mouth at bedtime.   montelukast 10 MG tablet Commonly known as: SINGULAIR Take 1 tablet (10 mg total) by mouth at bedtime.   Mucinex DM 30-600 MG Tb12 Take 2 tablets by mouth 2 (two) times daily as needed (Use if needed during flare ups.).   Nebulizer Mask Adult Misc 1 Device by Does not apply route as directed.   potassium chloride 10 MEQ tablet Commonly known as: KLOR-CON Take by mouth.   sertraline 100 MG  tablet Commonly known as: ZOLOFT Take 50 mg by mouth every morning.   spironolactone 25 MG tablet Commonly known as: ALDACTONE Take 0.5 tablets (12.5 mg total) by mouth daily.   tamsulosin 0.4 MG Caps capsule Commonly known as: FLOMAX Take 0.4 mg by mouth daily.    Past Medical History:  Diagnosis Date   Acid reflux    Asthma    Atypical chest pain    Coronary artery disease    Hyperlipidemia    Hypertension    Hypokalemia    Hypopotassemia    Obesity    Recurrent upper respiratory infection (URI)     Past Surgical History:  Procedure Laterality Date   ADENOIDECTOMY     CARDIAC CATHETERIZATION     CORONARY/GRAFT ACUTE MI REVASCULARIZATION N/A 06/19/2021   Procedure: Coronary/Graft Acute MI Revascularization;  Surgeon: Orbie Pyo, MD;  [facility]: [facility];  Service: Cardiovascular;  Laterality: N/A;   HERNIA REPAIR     LEFT HEART CATH AND CORONARY ANGIOGRAPHY N/A 06/19/2021   Procedure: LEFT HEART CATH AND CORONARY ANGIOGRAPHY;  Surgeon: Orbie Pyo, MD;  [facility]: [facility];  Service: Cardiovascular;  Laterality: N/A;    Review of systems negative except as noted in HPI / PMHx or noted below:  Review of Systems  Constitutional: Negative.   HENT: Negative.    Eyes: Negative.   Respiratory: Negative.    Cardiovascular: Negative.   Gastrointestinal: Negative.   Genitourinary: Negative.   Musculoskeletal: Negative.   Skin: Negative.   Neurological: Negative.   Endo/Heme/Allergies: Negative.   Psychiatric/Behavioral: Negative.       Objective:   Vitals:   11/06/23 1048  BP: 130/60  Pulse: 62  Resp: 16  Temp: 97.9 F (36.6 C)  SpO2: 95%   Height: 5' 6.24" (168.2 cm)  Weight: 262 lb 14.4 oz (119.3 kg)   Physical Exam Constitutional:      Appearance: He is not diaphoretic.  HENT:     Head: Normocephalic.     Right Ear: Tympanic membrane, ear canal and external ear normal.     Left Ear: Tympanic membrane, ear canal and  external ear normal.     Nose: Nose normal. No mucosal edema or rhinorrhea.     Mouth/Throat:     Pharynx: Uvula midline. No oropharyngeal exudate.  Eyes:     Conjunctiva/sclera: Conjunctivae normal.  Neck:     Thyroid: No thyromegaly.     Trachea: Trachea normal. No tracheal tenderness or tracheal deviation.  Cardiovascular:     Rate and Rhythm: Normal rate and regular rhythm.     Heart sounds: Normal heart sounds, S1 normal and S2 normal. No murmur heard. Pulmonary:     Effort: No respiratory distress.     Breath sounds: Normal breath sounds. No stridor. No wheezing or rales.  Lymphadenopathy:     Head:     Right side of head: No tonsillar adenopathy.  Left side of head: No tonsillar adenopathy.     Cervical: No cervical adenopathy.  Skin:    Findings: Rash (Facial solar damage) present. No erythema.     Nails: There is no clubbing.  Neurological:     Mental Status: He is alert.     Diagnostics: Spirometry was performed and demonstrated an FEV1 of 3.19 at 107 % of predicted.  Assessment and Plan:   1. Asthma, moderate persistent, well-controlled   2. Other allergic rhinitis   3. Gastroesophageal reflux disease, unspecified whether esophagitis present   4. History of solar dermatitis     1.  Continue famotidine 40 mg - 1 tablet 1-2 times per day  2.  Continue WIXELA 250 - 1 inhalations 1-2 times a day (empty lungs)  3.  Continue montelukast 10 mg - 1 tablet once a day  4.  If needed:   A.  Albuterol + Fluticasone 110 -2 inhalations TOGETHER or nebulizer every 4-6 hours  B.  Mucinex DM-2 tablets twice a day  C.  Antihistamine  D.  Flonase - 1-2 sprays each nostril 1 time per day  E.  Saline nasal gel as needed for nasal dryness  5. Return to clinic in 6 months or earlier if problem  6. Influenza = Tamiflu. Covid = Paxlovid  7. Keep up with yearly dermatology visits  Overall Harold Warner has done pretty well on his current therapy of using anti-inflammatory agents  for both his upper and lower airway as well as addressing his issue with reflux.  He has a selection of agents that can be utilized should they be required as noted above.  He has a fair amount of solar damage on the skin of his face and he usually sees a dermatologist every year and I have asked him to keep up with those yearly skin surveys.  I will see him back in this clinic in 6 months or earlier if there is a problem.   Laurette Schimke, MD Allergy / Immunology [facility]

## 2023-11-07 ENCOUNTER — Encounter: Payer: Self-pay | Admitting: Allergy and Immunology

## 2024-02-18 ENCOUNTER — Ambulatory Visit: Admitting: Physician Assistant

## 2024-05-05 ENCOUNTER — Ambulatory Visit: Payer: Self-pay | Admitting: Family

## 2024-05-06 ENCOUNTER — Ambulatory Visit: Payer: Self-pay | Admitting: Allergy and Immunology

## 2024-05-06 ENCOUNTER — Encounter: Payer: Self-pay | Admitting: Allergy and Immunology

## 2024-05-06 VITALS — BP 96/62 | HR 67 | Temp 96.9°F | Resp 18 | Ht 66.5 in | Wt 259.0 lb

## 2024-05-06 DIAGNOSIS — K219 Gastro-esophageal reflux disease without esophagitis: Secondary | ICD-10-CM

## 2024-05-06 DIAGNOSIS — J4540 Moderate persistent asthma, uncomplicated: Secondary | ICD-10-CM

## 2024-05-06 DIAGNOSIS — J3089 Other allergic rhinitis: Secondary | ICD-10-CM

## 2024-05-06 MED ORDER — NEBULIZER MASK ADULT MISC: 1.0000 | 1 refills | Status: AC

## 2024-05-06 MED ORDER — FLUTICASONE PROPIONATE HFA 110 MCG/ACT IN AERO: 2.0000 | INHALATION_SPRAY | RESPIRATORY_TRACT | 1 refills | Status: DC | PRN

## 2024-05-06 MED ORDER — MONTELUKAST SODIUM 10 MG PO TABS: 10.0000 mg | ORAL_TABLET | Freq: Every evening | ORAL | 1 refills | Status: AC

## 2024-05-06 MED ORDER — ALBUTEROL SULFATE HFA 108 (90 BASE) MCG/ACT IN AERS: INHALATION_SPRAY | RESPIRATORY_TRACT | 1 refills | Status: AC

## 2024-05-06 MED ORDER — LEVOCETIRIZINE DIHYDROCHLORIDE 5 MG PO TABS: 5.0000 mg | ORAL_TABLET | Freq: Every day | ORAL | 1 refills | Status: DC | PRN

## 2024-05-06 MED ORDER — FLUTICASONE-SALMETEROL 250-50 MCG/ACT IN AEPB: 1.0000 | INHALATION_SPRAY | Freq: Two times a day (BID) | RESPIRATORY_TRACT | 1 refills | Status: AC

## 2024-05-06 MED ORDER — FAMOTIDINE 40 MG PO TABS: 40.0000 mg | ORAL_TABLET | Freq: Two times a day (BID) | ORAL | 1 refills | Status: AC

## 2024-05-06 MED ORDER — ALBUTEROL SULFATE (2.5 MG/3ML) 0.083% IN NEBU: 2.5000 mg | INHALATION_SOLUTION | RESPIRATORY_TRACT | 1 refills | Status: AC | PRN

## 2024-05-06 NOTE — Progress Notes (Unsigned)
 [place] - [place] - [place] - [name] - [place]   Follow-up Note  Referring Provider: Joshua Bruckner, MD Primary Provider: Joshua Bruckner, MD Date of Office Visit: 05/06/2024  Subjective:   Harold Warner (DOB: [birth year]) is a 63 y.o. male who [facility]24 in re-evaluation of the following:  HPI: Harold Warner returns to this clinic in evaluation of asthma, allergic rhinitis, reflux.  I last saw him in his clinic 06 November 2023.  His asthma is been under pretty good control at this point.  He has been using his Wixela just 1 time per day and his requirement for short acting bronchodilator is less than 1 time per week.  He has had some issues with his nose recently.  He is a little bit stuffy.  He said some phlegm in his throat.  He is not using any nasal steroid.  He has not required a systemic steroid or antibiotic for any type of airway issue.  His reflux is under pretty good control at this point, using famotidine  mostly just 1 time per day.  Allergies as of 05/06/2024       Reactions   Adhesive [tape] Rash        Medication List    albuterol  (2.5 MG/3ML) 0.083% nebulizer solution Commonly known as: PROVENTIL  Take 3 mLs (2.5 mg total) by nebulization every 4 (four) hours as needed for wheezing or shortness of breath.   albuterol  108 (90 Base) MCG/ACT inhaler Commonly known as: VENTOLIN  HFA 2 inhalations every 4-6 hours as needed for cough, wheeze, shortness of breath and chest tightness.   amitriptyline 25 MG tablet Commonly known as: ELAVIL Take 12.5 tablets by mouth at bedtime.   atorvastatin  80 MG tablet Commonly known as: LIPITOR  Take 1 tablet (80 mg total) by mouth daily.   budesonide  0.5 MG/2ML nebulizer solution Commonly known as: Pulmicort  Use Pulmicort  0.5mg  twice daily for 1-2 weeks.   busPIRone 10 MG tablet Commonly known as: BUSPAR Take 10 mg by mouth daily.   cetirizine  10 MG  tablet Commonly known as: ZYRTEC  Take 1 tablet (10 mg total) by mouth daily as needed for allergies (Can use an extra dose during flares).   Cholecalciferol 50 MCG (2000 UT) Tabs Take 2,000 Units by mouth daily.   clopidogrel  75 MG tablet Commonly known as: PLAVIX  Take 1 tablet (75 mg total) by mouth daily.   Entresto  49-51 MG Generic drug: sacubitril -valsartan  Take 1 tablet by mouth 2 (two) times daily.   famotidine  40 MG tablet Commonly known as: PEPCID  Take 1 tablet (40 mg total) by mouth 2 (two) times daily. 1 tablet by mouth 1-2 times a day   fluticasone  110 MCG/ACT inhaler Commonly known as: FLOVENT  HFA Inhale 2 puffs into the lungs every 4 (four) hours as needed (Take with albuterol  during asthma flare ups.). 2 inhalations every 4-6 hours as needed for cough, shortness of breath, wheezing and chest tightness.   fluticasone  50 MCG/ACT nasal spray Commonly known as: FLONASE  Place 2 sprays into both nostrils daily.   fluticasone -salmeterol 250-50 MCG/ACT Aepb Commonly known as: Wixela Inhub Inhale 1 puff into the lungs in the morning and at bedtime. 1 inhalations 1-2 times a day.   ipratropium-albuterol  0.5-2.5 (3) MG/3ML Soln Commonly known as: DUONEB Take 3 mLs by nebulization every 4 (four) hours as needed.   levocetirizine 5 MG tablet Commonly known as: XYZAL  Take 1 tablet (5 mg total) by mouth daily as needed. Started by: Arwa Yero  J Neriah Brott   metoprolol  succinate 50 MG 24 hr tablet Commonly known as: TOPROL -XL Take 1 tablet (50 mg total) by mouth at bedtime.   montelukast  10 MG tablet Commonly known as: SINGULAIR  Take 1 tablet (10 mg total) by mouth at bedtime.   Mucinex  DM 30-600 MG Tb12 Take 2 tablets by mouth 2 (two) times daily as needed (Use if needed during flare ups.).   Nebulizer Mask Adult Misc 1 Device by Does not apply route as directed.   potassium chloride  10 MEQ tablet Commonly known as: KLOR-CON  Take by mouth.   sertraline 100 MG  tablet Commonly known as: ZOLOFT Take 50 mg by mouth every morning.   spironolactone  25 MG tablet Commonly known as: ALDACTONE  Take 0.5 tablets (12.5 mg total) by mouth daily.   tamsulosin 0.4 MG Caps capsule Commonly known as: FLOMAX Take 0.4 mg by mouth daily.     Past Medical History:  Diagnosis Date   Acid reflux    Asthma    Atypical chest pain    Coronary artery disease    Hyperlipidemia    Hypertension    Hypokalemia    Hypopotassemia    Obesity    Recurrent upper respiratory infection (URI)     Past Surgical History:  Procedure Laterality Date   ADENOIDECTOMY     CARDIAC CATHETERIZATION     CORONARY/GRAFT ACUTE MI REVASCULARIZATION N/A 06/19/2021   Procedure: Coronary/Graft Acute MI Revascularization;  Surgeon: Wendel Lurena POUR, MD;  [facility];  Service: Cardiovascular;  Laterality: N/A;   HERNIA REPAIR     LEFT HEART CATH AND CORONARY ANGIOGRAPHY N/A 06/19/2021   Procedure: LEFT HEART CATH AND CORONARY ANGIOGRAPHY;  Surgeon: Wendel Lurena POUR, MD;  [facility];  Service: Cardiovascular;  Laterality: N/A;    Review of systems negative except as noted in HPI / PMHx or noted below:  Review of Systems  Constitutional: Negative.   HENT: Negative.    Eyes: Negative.   Respiratory: Negative.    Cardiovascular: Negative.   Gastrointestinal: Negative.   Genitourinary: Negative.   Musculoskeletal: Negative.   Skin: Negative.   Neurological: Negative.   Endo/Heme/Allergies: Negative.   Psychiatric/Behavioral: Negative.       Objective:   Vitals:   05/06/24 1033  BP: 96/62  Pulse: 67  Resp: 18  Temp: (!) 96.9 F (36.1 C)  SpO2: 96%   Height: 5' 6.5 (168.9 cm)  Weight: 259 lb (117.5 kg)   Physical Exam Constitutional:      Appearance: He is not diaphoretic.  HENT:     Head: Normocephalic.     Right Ear: Tympanic membrane, ear canal and external ear normal.     Left Ear: Tympanic membrane, ear canal and  external ear normal.     Nose: Nose normal. No mucosal edema or rhinorrhea.     Mouth/Throat:     Pharynx: Uvula midline. No oropharyngeal exudate.  Eyes:     Conjunctiva/sclera: Conjunctivae normal.  Neck:     Thyroid: No thyromegaly.     Trachea: Trachea normal. No tracheal tenderness or tracheal deviation.  Cardiovascular:     Rate and Rhythm: Normal rate and regular rhythm.     Heart sounds: Normal heart sounds, S1 normal and S2 normal. No murmur heard. Pulmonary:     Effort: No respiratory distress.     Breath sounds: Normal breath sounds. No stridor. No wheezing or rales.  Lymphadenopathy:     Head:  Right side of head: No tonsillar adenopathy.     Left side of head: No tonsillar adenopathy.     Cervical: No cervical adenopathy.  Skin:    Findings: No erythema or rash.     Nails: There is no clubbing.  Neurological:     Mental Status: He is alert.     Diagnostics: Spirometry was performed and demonstrated an FEV1 of 3.28 at 110 % of predicted.  Assessment and Plan:   1. Asthma, moderate persistent, well-controlled   2. Other allergic rhinitis   3. Gastroesophageal reflux disease, unspecified whether esophagitis present    1.  Continue famotidine  40 mg - 1 tablet 1-2 times per day  2.  Continue WIXELA 250 - 1 inhalations 1-2 times a day (empty lungs)  3.  Continue montelukast  10 mg - 1 tablet once a day  4.  If needed:   A.  Albuterol  + Fluticasone  110 -2 inhalations TOGETHER or nebulizer every 4-6 hours  B.  Mucinex  DM-2 tablets twice a day  C.  Antihistamine  D.  Flonase  - 1-2 sprays each nostril 1 time per day  E.  Saline nasal gel as needed for nasal dryness  5. Return to clinic in 6 months or earlier if problem  6. Influenza = Tamiflu. Covid = Paxlovid   7. Plan for fall flu vaccine  Overall Chuck appears to be doing pretty well at this point in time while using his Wixela once a day, or intermittently using a nasal steroid, consistently using  montelukast , and also using famotidine  to address the inflammatory component of his airway disease and the reflux component of his airway disease.  He will continue on this plan and he has a selection of other agents to be utilized should they be required as noted above.  Assuming he does well I will see him back in this clinic in 6 months or earlier if there is a problem.  Camellia Denis, MD Allergy / Immunology [facility]

## 2024-05-06 NOTE — Patient Instructions (Signed)
  1.  Continue famotidine  40 mg - 1 tablet 1-2 times per day  2.  Continue WIXELA 250 - 1 inhalations 1-2 times a day (empty lungs)  3.  Continue montelukast  10 mg - 1 tablet once a day  4.  If needed:   A.  Albuterol  + Fluticasone  110 -2 inhalations TOGETHER or nebulizer every 4-6 hours  B.  Mucinex  DM-2 tablets twice a day  C.  Antihistamine  D.  Flonase  - 1-2 sprays each nostril 1 time per day  E.  Saline nasal gel as needed for nasal dryness  5. Return to clinic in 6 months or earlier if problem  6. Influenza = Tamiflu. Covid = Paxlovid   7. Plan for fall flu vaccine

## 2024-05-07 ENCOUNTER — Encounter: Payer: Self-pay | Admitting: Allergy and Immunology

## 2024-05-08 ENCOUNTER — Encounter: Payer: Self-pay | Admitting: Allergy and Immunology

## 2024-05-09 ENCOUNTER — Telehealth: Payer: Self-pay | Admitting: *Deleted

## 2024-05-09 NOTE — Telephone Encounter (Signed)
 Received fax from [facility] which states: This pt does not have authorization for community care. Please send prescription to an outside [facility] pharmacy.

## 2024-05-14 ENCOUNTER — Telehealth: Payer: Self-pay | Admitting: Internal Medicine

## 2024-05-14 NOTE — Telephone Encounter (Signed)
  Patient wanted to let Dr Wendel know that [facility]. He wanted to discuss if our office could do it instead. Please advise

## 2024-05-14 NOTE — Telephone Encounter (Signed)
 Left message to call back.

## 2024-05-16 NOTE — Telephone Encounter (Signed)
 Spoke to patient .  He states  he wanted Dr Wendel to be aware [facility] and has set up for next Thursday.   RN asked for his symptoms -  I'm tried. He states he told the [place]  , he would like  for Dr Wendel to be aware , wanted to know what Dr Wendel  thought  about it. ( Heads up)    RN  informed patient  that DrThukkani would need to evaluate the patient to see if a cardiac cath  is needed.  Offered an appointment for today 05/16/24.   Patient states he just wanted Dr Wendel to be aware and decline the appointment.  [facility] stated [facility].    RN informed patient will send this message for review.

## 2024-05-16 NOTE — Telephone Encounter (Signed)
 Patient is following up. He says his call was returned to home phone instead of mobile. Please return call to mobile line.

## 2024-05-16 NOTE — Telephone Encounter (Signed)
 Spoke with pt wife, aware of recommendations.

## 2024-05-23 NOTE — Telephone Encounter (Signed)
 Will forward to Dr. Wendel and his nurse.

## 2024-05-23 NOTE — Telephone Encounter (Signed)
 Left message with patient stating Dr. Wendel has reviewed records.  Thukkani, Arun K, MD to Me  Gordon Ronal SQUIBB, RN AT    05/23/24 12:39 PM Looks reasonable to me

## 2024-05-23 NOTE — Telephone Encounter (Signed)
 Pt called in stating he would like Dr. Wendel to review notes he dropped off. They are scanned into his chart.   He asked if I was able to see what it said. I told him the notes dropped off only include procedure/discharge instructions. It does not indicate Cath records but I will send to Dr. Wendel so he can advise on next steps for pt.

## 2024-05-26 NOTE — Telephone Encounter (Signed)
 Patient was returning call. Please advise ?

## 2024-05-26 NOTE — Telephone Encounter (Signed)
 Left message for pt to call.

## 2024-05-27 ENCOUNTER — Other Ambulatory Visit (HOSPITAL_COMMUNITY): Payer: Self-pay | Admitting: Cardiology

## 2024-05-27 ENCOUNTER — Inpatient Hospital Stay
Admission: RE | Admit: 2024-05-27 | Discharge: 2024-05-27 | Disposition: A | Discharge status: Home / Self Care | Payer: Self-pay | Source: Ambulatory Visit | Attending: Internal Medicine | Admitting: Internal Medicine

## 2024-05-27 DIAGNOSIS — I2510 Atherosclerotic heart disease of native coronary artery without angina pectoris: Secondary | ICD-10-CM

## 2024-05-28 NOTE — Progress Notes (Signed)
 Cardiology Office Note:   Date:  06/03/2024  ID:  Harold Warner, DOB 22-Apr-1961, MRN [id number] PCP:  Clinic, [facility]  [facility] Providers Cardiologist:  Wendel Haws, MD Referring MD: Joshua Bruckner, MD  Chief Complaint/Reason for Referral: Cardiology follow-up ASSESSMENT:    1. Coronary artery disease without angina pectoris, unspecified vessel or lesion type, unspecified whether native or transplanted heart   2. Ischemic congestive cardiomyopathy (HCC)   3. Hyperlipidemia LDL goal <55   4. Essential hypertension   5. CKD (chronic kidney disease) stage 2, GFR 60-89 ml/min   6. BMI 38.0-38.9,adult      PLAN:   In order of problems listed above: Coronary artery disease: Continue clopidogrel  75 mg, metoprolol  50 mg, atorvastatin  80 mg.  Due to ongoing anginal symptoms despite medical therapy will refer for coronary angiography and possible percutaneous coronary intervention. Ischemic cardiomyopathy: Continue Entresto  49 x 51 twice daily, spironolactone  25 mg, Toprol  50 mg Hyperlipidemia: Continue atorvastatin  80 mg; [facility] Hypertension: Continue Toprol  50 mg, Entresto  49 x 51 twice daily, spironolactone  25 mg.  BP is well-controlled today CKD stage II: Continue Entresto  49 x 51 mg twice daily Elevated BMI: Diet and exercise modification Prediabetes: Continue to control cardiovascular risk factors           Dispo:  Return if symptoms worsen or fail to improve.      Medication Adjustments/Labs and Tests Ordered: Current medicines are reviewed at length with the patient today.  Concerns regarding medicines are outlined above.  The following changes have been made:  no change   Labs/tests ordered: No orders of the defined types were placed in this encounter.   Medication Changes: No orders of the defined types were placed in this encounter.   Current medicines are reviewed at length with the patient today.  The patient does not have  concerns regarding medicines.  I spent 39 minutes reviewing all clinical data during and prior to this visit including all relevant imaging studies, laboratories, clinical information from other health systems and prior notes from both Cardiology and other specialties, interviewing the patient, conducting a complete physical examination, and coordinating care in order to formulate a comprehensive and personalized evaluation and treatment plan.   History of Present Illness:      FOCUSED PROBLEM LIST:   Anterior STEMI 2022 PCI pLAD complicated by VF arrest Moderate ISR pLAD stent, coronary angiography [facility] October 2025 Ischemic cardiomyopathy EF 40 to 45% without valve problems TTE January 2024 Hypertension Hyperlipidemia LP(a) 56.7 CKD stage II BMI 38 OSA on CPAP Prediabetes   7/24:  The patient is a 63 y.o. male with the indicated medical history here for routine cardiology follow-up.    The patient had previously been cared for by Dr. [name].  The patient was last seen in February of this year in response to an echocardiogram done in December due to shortness of breath.  This demonstrated ejection fraction of 40 to 45%.  The patient's Brilinta  was switched to clopidogrel  due to shortness of breath; Entresto  was started and eventually uptitrated and spironolactone  was also initiated.  A repeat echocardiogram in June of this year demonstrated normalized LV function.  The patient is doing well.  He denies any chest pain, exertional angina, or exertional dyspnea.  He has not been as active as he was when he was participating in cardiac rehabilitation.  He is gaining good amount of weight per his own admission.  He however has not required any  hospitalizations or emergency room visits for cardiovascular issues.  He is tolerating his atorvastatin  and dual antiplatelet therapy well.  Plan: Stop aspirin  and continue Plavix  monotherapy indefinitely; start Jardiance  10 mg daily, check lipid  panel, LFTs, LP(a), change Toprol  to 50 mg at bedtime.  Check hemoglobin A1c.  1/25: In the interim the patient's lipid panel was at goal.  His LP(a) was relatively low and his hemoglobin A1c was 6.2.  The patient is doing well from a cardiovascular standpoint.  He denies any cardiovascular complaints.  His biggest complaint today is a URI.  He saw his PCP recently with a productive cough.  He was prescribed Pulmicort  which has helped.  He is otherwise well and without significant complaints today.  Plan: Continue current therapy.  October 2025:  Patient consents to use of AI scribe. In the interim the patient underwent coronary angiography at the [facility] which demonstrated  in-stent restenosis of previously placed LAD stent.  He is here to discuss further since he is has episodes of angina at times.  He reports a blockage in his stent, possibly due to scar tissue, causing a dull ache periodically, sometimes when walking. He has not used nitroglycerin  for these symptoms but carries it with him. He is on Plavix  and is concerned about a significant blockage. 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  He has had a persistent cough for about four weeks, which he attributes to long COVID. He was prescribed amoxicillin, but the cough is thought to be viral in nature. The cough causes dizziness and near blackouts, impacting his ability to work, leading to time off since early October.  He works at the post office, lifting heavy packages, and has been off work due to his cough. No chest pain with every walk and has not experienced any bad bleeding or bruising.          Current Medications: Current Meds  Medication Sig   albuterol  (PROVENTIL ) (2.5 MG/3ML) 0.083% nebulizer solution Take 3 mLs (2.5 mg total) by nebulization every 4 (four) hours as needed for wheezing or shortness of breath.   albuterol  (VENTOLIN  HFA) 108 (90  Base) MCG/ACT inhaler 2 inhalations every 4-6 hours as needed for cough, wheeze, shortness of breath and chest tightness.   atorvastatin  (LIPITOR ) 80 MG tablet Take 1 tablet (80 mg total) by mouth daily.   benzonatate (TESSALON) 100 MG capsule Take 200 mg by mouth.   budesonide  (PULMICORT ) 0.5 MG/2ML nebulizer solution Use Pulmicort  0.5mg  twice daily for 1-2 weeks.   busPIRone (BUSPAR) 10 MG tablet Take 10 mg by mouth daily.   cetirizine  (ZYRTEC ) 10 MG tablet Take 1 tablet (10 mg total) by mouth daily as needed for allergies (Can use an extra dose during flares).   Cholecalciferol 50 MCG (2000 UT) TABS Take 2,000 Units by mouth daily.   clopidogrel  (PLAVIX ) 75 MG tablet Take 1 tablet (75 mg total) by mouth daily.   Dextromethorphan-guaiFENesin  (MUCINEX  DM) 30-600 MG TB12 Take 2 tablets by mouth 2 (two) times daily as needed (Use if needed during flare ups.).   famotidine  (PEPCID ) 40 MG tablet Take 1 tablet (40 mg total) by mouth 2 (two) times daily. 1 tablet by mouth 1-2 times a day   fluticasone  (FLONASE ) 50 MCG/ACT nasal spray Place 2 sprays into both nostrils daily.   fluticasone  (FLOVENT  HFA) 110 MCG/ACT inhaler Inhale 2 puffs into the lungs every 4 (four)  hours as needed (Take with albuterol  during asthma flare ups.). 2 inhalations every 4-6 hours as needed for cough, shortness of breath, wheezing and chest tightness.   fluticasone -salmeterol (WIXELA INHUB) 250-50 MCG/ACT AEPB Inhale 1 puff into the lungs in the morning and at bedtime. 1 inhalations 1-2 times a day.   ipratropium-albuterol  (DUONEB) 0.5-2.5 (3) MG/3ML SOLN Take 3 mLs by nebulization every 4 (four) hours as needed.   levocetirizine (XYZAL ) 5 MG tablet Take 1 tablet (5 mg total) by mouth daily as needed.   metoprolol  succinate (TOPROL -XL) 50 MG 24 hr tablet Take 1 tablet (50 mg total) by mouth at bedtime.   montelukast  (SINGULAIR ) 10 MG tablet Take 1 tablet (10 mg total) by mouth at bedtime.   potassium chloride  (KLOR-CON ) 10 MEQ  tablet Take by mouth.   Respiratory Therapy Supplies (NEBULIZER MASK ADULT) MISC 1 Device by Does not apply route as directed.   sacubitril -valsartan  (ENTRESTO ) 24-26 MG Take 1 tablet by mouth 2 (two) times daily.   sertraline (ZOLOFT) 100 MG tablet Take 50 mg by mouth every morning.   spironolactone  (ALDACTONE ) 25 MG tablet Take 25 mg by mouth daily.   tamsulosin (FLOMAX) 0.4 MG CAPS capsule Take 0.4 mg by mouth daily.   [DISCONTINUED] spironolactone  (ALDACTONE ) 25 MG tablet Take 0.5 tablets (12.5 mg total) by mouth daily.     Review of Systems:   Please see the history of present illness.    All other systems reviewed and are negative.     EKGs/Labs/Other Test Reviewed:   EKG:  EKG performed November 2023 that I personally reviewed demonstrates sinus rhythm with occasional PVCs and anterior T wave inversions   EKG Interpretation Date/Time:    Ventricular Rate:    PR Interval:    QRS Duration:    QT Interval:    QTC Calculation:   R Axis:      Text Interpretation:           Risk Assessment/Calculations:          Physical Exam:   VS:  BP 108/66   Pulse 70   Ht 5' 7 (1.702 m)   Wt 255 lb 6.4 oz (115.8 kg)   SpO2 96%   BMI 40.00 kg/m        Wt Readings from Last 3 Encounters:  06/03/24 255 lb 6.4 oz (115.8 kg)  05/30/24 259 lb 0.7 oz (117.5 kg)  05/06/24 259 lb (117.5 kg)      GENERAL:  No apparent distress, AOx3 HEENT:  No carotid bruits, +2 carotid impulses, no scleral icterus CAR: RRR  no murmurs, gallops, rubs, or thrills RES:  Clear to auscultation bilaterally ABD:  Soft, nontender, nondistended, positive bowel sounds x 4 VASC:  +2 radial pulses, +2 carotid pulses NEURO:  CN 2-12 grossly intact; motor and sensory grossly intact PSYCH:  No active depression or anxiety EXT:  No edema, ecchymosis, or cyanosis  Signed, Shelly Shoultz K Harold Getter, MD  06/03/2024 3:21 PM    [facility] [address] [place], [place], [place]  [address] Phone: [phone]; Fax: [phone]   Note:  This document was prepared using Dragon voice recognition software and may include unintentional dictation errors.

## 2024-05-28 NOTE — H&P (View-Only) (Signed)
 Cardiology Office Note:   Date:  06/03/2024  ID:  Harold Warner, DOB 22-Apr-1961, MRN [id number] PCP:  Clinic, [facility]  [facility] Providers Cardiologist:  Wendel Haws, MD Referring MD: Joshua Bruckner, MD  Chief Complaint/Reason for Referral: Cardiology follow-up ASSESSMENT:    1. Coronary artery disease without angina pectoris, unspecified vessel or lesion type, unspecified whether native or transplanted heart   2. Ischemic congestive cardiomyopathy (HCC)   3. Hyperlipidemia LDL goal <55   4. Essential hypertension   5. CKD (chronic kidney disease) stage 2, GFR 60-89 ml/min   6. BMI 38.0-38.9,adult      PLAN:   In order of problems listed above: Coronary artery disease: Continue clopidogrel  75 mg, metoprolol  50 mg, atorvastatin  80 mg.  Due to ongoing anginal symptoms despite medical therapy will refer for coronary angiography and possible percutaneous coronary intervention. Ischemic cardiomyopathy: Continue Entresto  49 x 51 twice daily, spironolactone  25 mg, Toprol  50 mg Hyperlipidemia: Continue atorvastatin  80 mg; [facility] Hypertension: Continue Toprol  50 mg, Entresto  49 x 51 twice daily, spironolactone  25 mg.  BP is well-controlled today CKD stage II: Continue Entresto  49 x 51 mg twice daily Elevated BMI: Diet and exercise modification Prediabetes: Continue to control cardiovascular risk factors           Dispo:  Return if symptoms worsen or fail to improve.      Medication Adjustments/Labs and Tests Ordered: Current medicines are reviewed at length with the patient today.  Concerns regarding medicines are outlined above.  The following changes have been made:  no change   Labs/tests ordered: No orders of the defined types were placed in this encounter.   Medication Changes: No orders of the defined types were placed in this encounter.   Current medicines are reviewed at length with the patient today.  The patient does not have  concerns regarding medicines.  I spent 39 minutes reviewing all clinical data during and prior to this visit including all relevant imaging studies, laboratories, clinical information from other health systems and prior notes from both Cardiology and other specialties, interviewing the patient, conducting a complete physical examination, and coordinating care in order to formulate a comprehensive and personalized evaluation and treatment plan.   History of Present Illness:      FOCUSED PROBLEM LIST:   Anterior STEMI 2022 PCI pLAD complicated by VF arrest Moderate ISR pLAD stent, coronary angiography [facility] October 2025 Ischemic cardiomyopathy EF 40 to 45% without valve problems TTE January 2024 Hypertension Hyperlipidemia LP(a) 56.7 CKD stage II BMI 38 OSA on CPAP Prediabetes   7/24:  The patient is a 63 y.o. male with the indicated medical history here for routine cardiology follow-up.    The patient had previously been cared for by Dr. [name].  The patient was last seen in February of this year in response to an echocardiogram done in December due to shortness of breath.  This demonstrated ejection fraction of 40 to 45%.  The patient's Brilinta  was switched to clopidogrel  due to shortness of breath; Entresto  was started and eventually uptitrated and spironolactone  was also initiated.  A repeat echocardiogram in June of this year demonstrated normalized LV function.  The patient is doing well.  He denies any chest pain, exertional angina, or exertional dyspnea.  He has not been as active as he was when he was participating in cardiac rehabilitation.  He is gaining good amount of weight per his own admission.  He however has not required any  hospitalizations or emergency room visits for cardiovascular issues.  He is tolerating his atorvastatin  and dual antiplatelet therapy well.  Plan: Stop aspirin  and continue Plavix  monotherapy indefinitely; start Jardiance  10 mg daily, check lipid  panel, LFTs, LP(a), change Toprol  to 50 mg at bedtime.  Check hemoglobin A1c.  1/25: In the interim the patient's lipid panel was at goal.  His LP(a) was relatively low and his hemoglobin A1c was 6.2.  The patient is doing well from a cardiovascular standpoint.  He denies any cardiovascular complaints.  His biggest complaint today is a URI.  He saw his PCP recently with a productive cough.  He was prescribed Pulmicort  which has helped.  He is otherwise well and without significant complaints today.  Plan: Continue current therapy.  October 2025:  Patient consents to use of AI scribe. In the interim the patient underwent coronary angiography at the [facility] which demonstrated  in-stent restenosis of previously placed LAD stent.  He is here to discuss further since he is has episodes of angina at times.  He reports a blockage in his stent, possibly due to scar tissue, causing a dull ache periodically, sometimes when walking. He has not used nitroglycerin  for these symptoms but carries it with him. He is on Plavix  and is concerned about a significant blockage. 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  He has had a persistent cough for about four weeks, which he attributes to long COVID. He was prescribed amoxicillin, but the cough is thought to be viral in nature. The cough causes dizziness and near blackouts, impacting his ability to work, leading to time off since early October.  He works at the post office, lifting heavy packages, and has been off work due to his cough. No chest pain with every walk and has not experienced any bad bleeding or bruising.          Current Medications: Current Meds  Medication Sig   albuterol  (PROVENTIL ) (2.5 MG/3ML) 0.083% nebulizer solution Take 3 mLs (2.5 mg total) by nebulization every 4 (four) hours as needed for wheezing or shortness of breath.   albuterol  (VENTOLIN  HFA) 108 (90  Base) MCG/ACT inhaler 2 inhalations every 4-6 hours as needed for cough, wheeze, shortness of breath and chest tightness.   atorvastatin  (LIPITOR ) 80 MG tablet Take 1 tablet (80 mg total) by mouth daily.   benzonatate (TESSALON) 100 MG capsule Take 200 mg by mouth.   budesonide  (PULMICORT ) 0.5 MG/2ML nebulizer solution Use Pulmicort  0.5mg  twice daily for 1-2 weeks.   busPIRone (BUSPAR) 10 MG tablet Take 10 mg by mouth daily.   cetirizine  (ZYRTEC ) 10 MG tablet Take 1 tablet (10 mg total) by mouth daily as needed for allergies (Can use an extra dose during flares).   Cholecalciferol 50 MCG (2000 UT) TABS Take 2,000 Units by mouth daily.   clopidogrel  (PLAVIX ) 75 MG tablet Take 1 tablet (75 mg total) by mouth daily.   Dextromethorphan-guaiFENesin  (MUCINEX  DM) 30-600 MG TB12 Take 2 tablets by mouth 2 (two) times daily as needed (Use if needed during flare ups.).   famotidine  (PEPCID ) 40 MG tablet Take 1 tablet (40 mg total) by mouth 2 (two) times daily. 1 tablet by mouth 1-2 times a day   fluticasone  (FLONASE ) 50 MCG/ACT nasal spray Place 2 sprays into both nostrils daily.   fluticasone  (FLOVENT  HFA) 110 MCG/ACT inhaler Inhale 2 puffs into the lungs every 4 (four)  hours as needed (Take with albuterol  during asthma flare ups.). 2 inhalations every 4-6 hours as needed for cough, shortness of breath, wheezing and chest tightness.   fluticasone -salmeterol (WIXELA INHUB) 250-50 MCG/ACT AEPB Inhale 1 puff into the lungs in the morning and at bedtime. 1 inhalations 1-2 times a day.   ipratropium-albuterol  (DUONEB) 0.5-2.5 (3) MG/3ML SOLN Take 3 mLs by nebulization every 4 (four) hours as needed.   levocetirizine (XYZAL ) 5 MG tablet Take 1 tablet (5 mg total) by mouth daily as needed.   metoprolol  succinate (TOPROL -XL) 50 MG 24 hr tablet Take 1 tablet (50 mg total) by mouth at bedtime.   montelukast  (SINGULAIR ) 10 MG tablet Take 1 tablet (10 mg total) by mouth at bedtime.   potassium chloride  (KLOR-CON ) 10 MEQ  tablet Take by mouth.   Respiratory Therapy Supplies (NEBULIZER MASK ADULT) MISC 1 Device by Does not apply route as directed.   sacubitril -valsartan  (ENTRESTO ) 24-26 MG Take 1 tablet by mouth 2 (two) times daily.   sertraline (ZOLOFT) 100 MG tablet Take 50 mg by mouth every morning.   spironolactone  (ALDACTONE ) 25 MG tablet Take 25 mg by mouth daily.   tamsulosin (FLOMAX) 0.4 MG CAPS capsule Take 0.4 mg by mouth daily.   [DISCONTINUED] spironolactone  (ALDACTONE ) 25 MG tablet Take 0.5 tablets (12.5 mg total) by mouth daily.     Review of Systems:   Please see the history of present illness.    All other systems reviewed and are negative.     EKGs/Labs/Other Test Reviewed:   EKG:  EKG performed November 2023 that I personally reviewed demonstrates sinus rhythm with occasional PVCs and anterior T wave inversions   EKG Interpretation Date/Time:    Ventricular Rate:    PR Interval:    QRS Duration:    QT Interval:    QTC Calculation:   R Axis:      Text Interpretation:           Risk Assessment/Calculations:          Physical Exam:   VS:  BP 108/66   Pulse 70   Ht 5' 7 (1.702 m)   Wt 255 lb 6.4 oz (115.8 kg)   SpO2 96%   BMI 40.00 kg/m        Wt Readings from Last 3 Encounters:  06/03/24 255 lb 6.4 oz (115.8 kg)  05/30/24 259 lb 0.7 oz (117.5 kg)  05/06/24 259 lb (117.5 kg)      GENERAL:  No apparent distress, AOx3 HEENT:  No carotid bruits, +2 carotid impulses, no scleral icterus CAR: RRR  no murmurs, gallops, rubs, or thrills RES:  Clear to auscultation bilaterally ABD:  Soft, nontender, nondistended, positive bowel sounds x 4 VASC:  +2 radial pulses, +2 carotid pulses NEURO:  CN 2-12 grossly intact; motor and sensory grossly intact PSYCH:  No active depression or anxiety EXT:  No edema, ecchymosis, or cyanosis  Signed, Shelly Shoultz K Enoc Getter, MD  06/03/2024 3:21 PM    [facility] [address] [place], [place], [place]  [address] Phone: [phone]; Fax: [phone]   Note:  This document was prepared using Dragon voice recognition software and may include unintentional dictation errors.

## 2024-05-30 ENCOUNTER — Encounter (HOSPITAL_COMMUNITY): Payer: Self-pay | Admitting: *Deleted

## 2024-05-30 ENCOUNTER — Other Ambulatory Visit: Payer: Self-pay

## 2024-05-30 ENCOUNTER — Emergency Department (HOSPITAL_COMMUNITY)
Admission: EM | Admit: 2024-05-30 | Discharge: 2024-05-30 | Disposition: A | Discharge status: Home / Self Care | Attending: Emergency Medicine | Admitting: Emergency Medicine

## 2024-05-30 ENCOUNTER — Emergency Department (HOSPITAL_COMMUNITY)

## 2024-05-30 DIAGNOSIS — R059 Cough, unspecified: Secondary | ICD-10-CM | POA: Diagnosis present

## 2024-05-30 DIAGNOSIS — Z79899 Other long term (current) drug therapy: Secondary | ICD-10-CM | POA: Insufficient documentation

## 2024-05-30 DIAGNOSIS — R052 Subacute cough: Secondary | ICD-10-CM | POA: Insufficient documentation

## 2024-05-30 DIAGNOSIS — R0789 Other chest pain: Secondary | ICD-10-CM | POA: Diagnosis not present

## 2024-05-30 DIAGNOSIS — D72829 Elevated white blood cell count, unspecified: Secondary | ICD-10-CM | POA: Diagnosis not present

## 2024-05-30 LAB — CBC
HCT: 45.7 % (ref 39.0–52.0)
Hemoglobin: 15 g/dL (ref 13.0–17.0)
MCH: 30 pg (ref 26.0–34.0)
MCHC: 32.8 g/dL (ref 30.0–36.0)
MCV: 91.4 fL (ref 80.0–100.0)
Platelets: 347 K/uL (ref 150–400)
RBC: 5 MIL/uL (ref 4.22–5.81)
RDW: 12.8 % (ref 11.5–15.5)
WBC: 11.5 K/uL — ABNORMAL HIGH (ref 4.0–10.5)
nRBC: 0 % (ref 0.0–0.2)

## 2024-05-30 LAB — TROPONIN I (HIGH SENSITIVITY)
Troponin I (High Sensitivity): 7 ng/L (ref <18)
Troponin I (High Sensitivity): 6 ng/L (ref <18)

## 2024-05-30 LAB — BASIC METABOLIC PANEL WITH GFR
Anion gap: 10 (ref 5–15)
BUN: 12 mg/dL (ref 8–23)
CO2: 23 mmol/L (ref 22–32)
Calcium: 9.1 mg/dL (ref 8.9–10.3)
Chloride: 106 mmol/L (ref 98–111)
Creatinine, Ser: 1.27 mg/dL — ABNORMAL HIGH (ref 0.61–1.24)
GFR, Estimated: >60 mL/min (ref >60)
Glucose, Bld: 104 mg/dL — ABNORMAL HIGH (ref 70–99)
Potassium: 4.4 mmol/L (ref 3.5–5.1)
Sodium: 139 mmol/L (ref 135–145)

## 2024-05-30 LAB — RESP PANEL BY RT-PCR (RSV, FLU A&B, COVID)  RVPGX2
Influenza A by PCR: NEGATIVE
Influenza B by PCR: NEGATIVE
Resp Syncytial Virus by PCR: NEGATIVE
SARS Coronavirus 2 by RT PCR: NEGATIVE

## 2024-05-30 MED ORDER — IOHEXOL 350 MG/ML SOLN
80.0000 mL | Freq: Once | INTRAVENOUS | Status: AC | PRN
  Administered 2024-05-30: 80 mL via INTRAVENOUS

## 2024-05-30 NOTE — ED Triage Notes (Addendum)
 Chest ache and cough x3 weeks. Had heart cath at [facility] 10/1 and pt states that stent was 80% blocked.

## 2024-05-30 NOTE — Discharge Instructions (Addendum)
 Return for any problem.  ?

## 2024-05-30 NOTE — ED Provider Notes (Signed)
 [place] EMERGENCY DEPARTMENT AT [facility] Provider Note   CSN: [id number] Arrival date & time: 05/30/24  1707     Patient presents with: Chest Pain and Cough   Harold Warner is a 63 y.o. male.   63 year old male with prior medical history as detailed below presents for evaluation.  Patient reports that he has had a cough and upper respiratory congestion for the last 2 to 2-1/2 weeks.  He was given a course of amoxicillin by the [place].  He completed this 7-day course of amoxicillin 3 days ago.  He reports that his cough is still persistent.  He complains of some vague right sided chest discomfort.  This is worse with coughing.  He denies fever.  He denies nausea or vomiting.  He reports that his cough is minimally productive.  Reports that he was checked for COVID and flu about a week and a half ago prior to initiation of amoxicillin.  Even though this test was negative, he is concerned that he may have long COVID.  The history is provided by the patient and medical records.       Prior to Admission medications   Medication Sig Start Date End Date Taking? Authorizing Provider  albuterol  (PROVENTIL ) (2.5 MG/3ML) 0.083% nebulizer solution Take 3 mLs (2.5 mg total) by nebulization every 4 (four) hours as needed for wheezing or shortness of breath. 05/06/24   Kozlow, Camellia PARAS, MD  albuterol  (VENTOLIN  HFA) 108 (90 Base) MCG/ACT inhaler 2 inhalations every 4-6 hours as needed for cough, wheeze, shortness of breath and chest tightness. 05/06/24   Kozlow, Eric J, MD  amitriptyline (ELAVIL) 25 MG tablet Take 12.5 tablets by mouth at bedtime. 08/03/21   [provider]  atorvastatin  (LIPITOR ) 80 MG tablet Take 1 tablet (80 mg total) by mouth daily. 06/22/21   Henry Manuelita NOVAK, NP  budesonide  (PULMICORT ) 0.5 MG/2ML nebulizer solution Use Pulmicort  0.5mg  twice daily for 1-2 weeks. 07/31/23   Tobie Arleta SQUIBB, MD  busPIRone (BUSPAR) 10 MG tablet Take 10 mg by mouth daily.     [provider]  cetirizine  (ZYRTEC ) 10 MG tablet Take 1 tablet (10 mg total) by mouth daily as needed for allergies (Can use an extra dose during flares). 11/06/23   Kozlow, Camellia PARAS, MD  Cholecalciferol 50 MCG (2000 UT) TABS Take 2,000 Units by mouth daily. 08/03/21   [provider]  clopidogrel  (PLAVIX ) 75 MG tablet Take 1 tablet (75 mg total) by mouth daily. 08/25/22   [name] Victory LELON, MD  Dextromethorphan-guaiFENesin  (MUCINEX  DM) 30-600 MG TB12 Take 2 tablets by mouth 2 (two) times daily as needed (Use if needed during flare ups.). 12/20/21   Kozlow, Camellia PARAS, MD  famotidine  (PEPCID ) 40 MG tablet Take 1 tablet (40 mg total) by mouth 2 (two) times daily. 1 tablet by mouth 1-2 times a day 05/06/24   Kozlow, Eric J, MD  fluticasone  (FLONASE ) 50 MCG/ACT nasal spray Place 2 sprays into both nostrils daily. 11/06/23   Kozlow, Camellia PARAS, MD  fluticasone  (FLOVENT  HFA) 110 MCG/ACT inhaler Inhale 2 puffs into the lungs every 4 (four) hours as needed (Take with albuterol  during asthma flare ups.). 2 inhalations every 4-6 hours as needed for cough, shortness of breath, wheezing and chest tightness. 05/06/24   Kozlow, Camellia PARAS, MD  fluticasone -salmeterol (WIXELA INHUB) 250-50 MCG/ACT AEPB Inhale 1 puff into the lungs in the morning and at bedtime. 1 inhalations 1-2 times a day. 05/06/24   Kozlow, Camellia PARAS,  MD  ipratropium-albuterol  (DUONEB) 0.5-2.5 (3) MG/3ML SOLN Take 3 mLs by nebulization every 4 (four) hours as needed. 07/31/23   Tobie Arleta SQUIBB, MD  levocetirizine (XYZAL ) 5 MG tablet Take 1 tablet (5 mg total) by mouth daily as needed. 05/06/24   Kozlow, Camellia PARAS, MD  metoprolol  succinate (TOPROL -XL) 50 MG 24 hr tablet Take 1 tablet (50 mg total) by mouth at bedtime. 03/01/23   Thukkani, Arun K, MD  montelukast  (SINGULAIR ) 10 MG tablet Take 1 tablet (10 mg total) by mouth at bedtime. 05/06/24   Kozlow, Camellia PARAS, MD  potassium chloride  (KLOR-CON ) 10 MEQ tablet Take by mouth. 05/31/23   [provider]   Respiratory Therapy Supplies (NEBULIZER MASK ADULT) MISC 1 Device by Does not apply route as directed. 05/06/24   Kozlow, Camellia PARAS, MD  sacubitril -valsartan  (ENTRESTO ) 49-51 MG Take 1 tablet by mouth 2 (two) times daily. 08/09/23   Swinyer, Rosaline HERO, NP  sertraline (ZOLOFT) 100 MG tablet Take 50 mg by mouth every morning. 07/20/21   [provider]  spironolactone  (ALDACTONE ) 25 MG tablet Take 0.5 tablets (12.5 mg total) by mouth daily. 06/26/23   Thukkani, Arun K, MD  tamsulosin (FLOMAX) 0.4 MG CAPS capsule Take 0.4 mg by mouth daily.    [provider]    Allergies: Adhesive [tape]    Review of Systems  All other systems reviewed and are negative.   Updated Vital Signs BP 134/82   Pulse 65   Temp 98 F (36.7 C)   Resp 18   Ht 5' 6 (1.676 m)   Wt 117.5 kg   SpO2 98%   BMI 41.81 kg/m   Physical Exam Vitals and nursing note reviewed.  Constitutional:      General: He is not in acute distress.    Appearance: Normal appearance. He is well-developed.  HENT:     Head: Normocephalic and atraumatic.  Eyes:     Conjunctiva/sclera: Conjunctivae normal.     Pupils: Pupils are equal, round, and reactive to light.  Cardiovascular:     Rate and Rhythm: Normal rate and regular rhythm.     Heart sounds: Normal heart sounds.  Pulmonary:     Effort: Pulmonary effort is normal. No respiratory distress.     Breath sounds: Normal breath sounds.  Abdominal:     General: There is no distension.     Palpations: Abdomen is soft.     Tenderness: There is no abdominal tenderness.  Musculoskeletal:        General: No deformity. Normal range of motion.     Cervical back: Normal range of motion and neck supple.  Skin:    General: Skin is warm and dry.  Neurological:     General: No focal deficit present.     Mental Status: He is alert and oriented to person, place, and time.     (all labs ordered are listed, but only abnormal results are displayed) Labs Reviewed  CBC -  Abnormal; Notable for the following components:      Result Value   WBC 11.5 (*)    All other components within normal limits  RESP PANEL BY RT-PCR (RSV, FLU A&B, COVID)  [id number]  BASIC METABOLIC PANEL WITH GFR  TROPONIN I (HIGH SENSITIVITY)    EKG: None  Radiology: DG Chest 2 View Result Date: 05/30/2024 CLINICAL DATA:  Chest pain, cough EXAM: CHEST - 2 VIEW COMPARISON:  06/19/2021 FINDINGS: Heart and mediastinal contours are within normal limits. No focal opacities  or effusions. No acute bony abnormality. No pneumothorax. IMPRESSION: No active cardiopulmonary disease. Electronically Signed   By: Franky Crease M.D.   On: 05/30/2024 18:24     Procedures   Medications Ordered in the ED - No data to display                                  Medical Decision Making Patient complains of persistent cough.  This cough has remained even after completion of course of outpatient antibiotics.  Patient without objective evidence of significant infection.  No PE or pneumonia seen on CT chest.  COVID and flu testing is negative.  EKG does not suggest acute ischemia.  Troponins are without significant elevation or delta.  Patient's white count is mildly elevated at 11.5.  However, without clear evidence of other abnormality I do not feel the patient requires additional workup and/or admission.  On reevaluation the patient feels improved.  Patient desires discharge home.  [facility]  Importance of close follow-up stressed.  Strict return precautions given and understood.  Amount and/or Complexity of Data Reviewed Radiology: ordered.  Risk Prescription drug management.        Final diagnoses:  Subacute cough    ED Discharge Orders     None          Laurice Maude BROCKS, MD 05/30/24 2143

## 2024-06-03 ENCOUNTER — Encounter: Payer: Self-pay | Admitting: Internal Medicine

## 2024-06-03 ENCOUNTER — Ambulatory Visit: Attending: Internal Medicine | Admitting: Internal Medicine

## 2024-06-03 VITALS — BP 108/66 | HR 70 | Ht 67.0 in | Wt 255.4 lb

## 2024-06-03 DIAGNOSIS — Z6838 Body mass index (BMI) 38.0-38.9, adult: Secondary | ICD-10-CM

## 2024-06-03 DIAGNOSIS — I2510 Atherosclerotic heart disease of native coronary artery without angina pectoris: Secondary | ICD-10-CM | POA: Diagnosis not present

## 2024-06-03 DIAGNOSIS — I255 Ischemic cardiomyopathy: Secondary | ICD-10-CM | POA: Diagnosis not present

## 2024-06-03 DIAGNOSIS — E785 Hyperlipidemia, unspecified: Secondary | ICD-10-CM | POA: Diagnosis not present

## 2024-06-03 DIAGNOSIS — I10 Essential (primary) hypertension: Secondary | ICD-10-CM

## 2024-06-03 DIAGNOSIS — N182 Chronic kidney disease, stage 2 (mild): Secondary | ICD-10-CM

## 2024-06-03 DIAGNOSIS — I420 Dilated cardiomyopathy: Secondary | ICD-10-CM

## 2024-06-03 NOTE — Telephone Encounter (Signed)
 Pt seen in office today. All questions/ concerns addressed.

## 2024-06-03 NOTE — Patient Instructions (Signed)
 Medication Instructions:  No medication changes were made at this visit. Continue current regimen.   *If you need a refill on your cardiac medications before your next appointment, please call your pharmacy*  Lab Work: None ordered today. If you have labs (blood work) drawn today and your tests are completely normal, you will receive your results only by: MyChart Message (if you have MyChart) OR A paper copy in the mail If you have any lab test that is abnormal or we need to change your treatment, we will call you to review the results.  Testing/Procedures: Your physician has requested that you have a cardiac catheterization. Cardiac catheterization is used to diagnose and/or treat various heart conditions. Doctors may recommend this procedure for a number of different reasons. The most common reason is to evaluate chest pain. Chest pain can be a symptom of coronary artery disease (CAD), and cardiac catheterization can show whether plaque is narrowing or blocking your heart's arteries. This procedure is also used to evaluate the valves, as well as measure the blood flow and oxygen levels in different parts of your heart. For further information please visit [email or web address] Please follow instruction sheet, as given.   Follow-Up: At [facility], you and your health needs are our priority.  As part of our continuing mission to provide you with exceptional heart care, our providers are all part of one team.  This team includes your primary Cardiologist (physician) and Advanced Practice Providers or APPs (Physician Assistants and Nurse Practitioners) who all work together to provide you with the care you need, when you need it.  [facility] next appointment:   [facility]   Other Instructions .      Cardiac/Peripheral Catheterization   You are scheduled for a Cardiac Catheterization on Friday, October 24 with Dr. Lurena Red.  1. Please arrive at the [facility] (Main  Entrance A) at [facility]: [address] [place], [place] [address] at 5:30 AM (This time is 2 hour(s) before your procedure to ensure your preparation). Your procedure is scheduled to begin at 7:30 AM.  Free valet parking service is available. You will check in at ADMITTING. The support person will be asked to wait in the waiting room.  It is OK to have someone drop you off and come back when you are ready to be discharged.        Special note: Every effort is made to have your procedure done on time. Please understand that emergencies sometimes delay scheduled procedures.  2. Diet: Nothing to eat after midnight.  3. Hydration:You need to be well hydrated before your procedure. On October 24, you may drink approved liquids (see below) until 2 hours before the procedure, with 16 oz of water as your last intake.   List of approved liquids water, clear juice, clear tea, black coffee, fruit juices, non-citric and without pulp, carbonated beverages, Gatorade, Kool -Aid, plain Jello-O and plain ice popsicles.  4. Labs: You will not need to have labs completed.  5. Medication instructions in preparation for your procedure:   Contrast Allergy: No   Stop taking, Entresto  Thursday, October 23,, Spironolactone  Thursday, October 23,   On the morning of your procedure, take Plavix /Clopidogrel  and any morning medicines NOT listed above.  You may use sips of water.  6. Plan to go home the same day, you will only stay overnight if medically necessary. 7. You MUST have a responsible adult to drive you home. 8. An adult MUST  be with you the first 24 hours after you arrive home. 9. Bring a current list of your medications, and the last time and date medication taken. 10. Bring ID and current insurance cards. 11.Please wear clothes that are easy to get on and off and wear slip-on shoes.  Thank you for allowing us  to care for you!   -- [facility]

## 2024-06-11 ENCOUNTER — Encounter: Payer: Self-pay | Admitting: Allergy and Immunology

## 2024-06-11 ENCOUNTER — Ambulatory Visit (INDEPENDENT_AMBULATORY_CARE_PROVIDER_SITE_OTHER): Payer: Self-pay | Admitting: Allergy and Immunology

## 2024-06-11 ENCOUNTER — Telehealth: Payer: Self-pay | Admitting: *Deleted

## 2024-06-11 VITALS — BP 104/64 | HR 70 | Temp 97.8°F | Resp 16

## 2024-06-11 DIAGNOSIS — T50905A Adverse effect of unspecified drugs, medicaments and biological substances, initial encounter: Secondary | ICD-10-CM

## 2024-06-11 DIAGNOSIS — J3089 Other allergic rhinitis: Secondary | ICD-10-CM

## 2024-06-11 DIAGNOSIS — J4540 Moderate persistent asthma, uncomplicated: Secondary | ICD-10-CM

## 2024-06-11 DIAGNOSIS — K219 Gastro-esophageal reflux disease without esophagitis: Secondary | ICD-10-CM

## 2024-06-11 DIAGNOSIS — Z888 Allergy status to other drugs, medicaments and biological substances status: Secondary | ICD-10-CM

## 2024-06-11 MED ORDER — METHYLPREDNISOLONE ACETATE 40 MG/ML IJ SUSP
40.0000 mg | Freq: Once | INTRAMUSCULAR | Status: AC
  Administered 2024-06-11: 40 mg via INTRAMUSCULAR

## 2024-06-11 NOTE — Progress Notes (Unsigned)
 [place] [facility] [place] [facility] [place] [facility] [name] [facility] [place]   Follow-up Note  Referring Provider: [facility] Primary Provider: [facility] Date of Office Visit: 06/11/2024  Subjective:   Harold Warner (DOB: 10/27/1960) is a 63 y.o. male who [facility] in re-evaluation of the following:  HPI: Harold Warner returns to this clinic in evaluation of asthma, allergic rhinitis, reflux.  I last saw him in his clinic 06 May 2024.  Allergies as of 06/11/2024       Reactions   Adhesive [tape] Rash        Medication List        Accurate as of June 11, 2024  4:32 PM. If you have any questions, ask your nurse or doctor.          STOP taking these medications    amitriptyline 25 MG tablet Commonly known as: ELAVIL Stopped by: Jayleen Scaglione J Ladasha Schnackenberg       TAKE these medications    albuterol  (2.5 MG/3ML) 0.083% nebulizer solution Commonly known as: PROVENTIL  Take 3 mLs (2.5 mg total) by nebulization every 4 (four) hours as needed for wheezing or shortness of breath.   albuterol  108 (90 Base) MCG/ACT inhaler Commonly known as: VENTOLIN  HFA 2 inhalations every 4-6 hours as needed for cough, wheeze, shortness of breath and chest tightness.   atorvastatin  80 MG tablet Commonly known as: LIPITOR  Take 1 tablet (80 mg total) by mouth daily.   benzonatate 100 MG capsule Commonly known as: TESSALON Take 200 mg by mouth.   budesonide  0.5 MG/2ML nebulizer solution Commonly known as: Pulmicort  Use Pulmicort  0.5mg  twice daily for 1-2 weeks.   busPIRone 10 MG tablet Commonly known as: BUSPAR Take 10 mg by mouth daily.   cetirizine  10 MG tablet Commonly known as: ZYRTEC  Take 1 tablet (10 mg total) by mouth daily as needed for allergies (Can use an extra dose during flares).   Cholecalciferol 50 MCG (2000 UT) Tabs Take 2,000 Units by mouth daily.   clopidogrel  75 MG tablet Commonly known as: PLAVIX  Take 1  tablet (75 mg total) by mouth daily.   famotidine  40 MG tablet Commonly known as: PEPCID  Take 1 tablet (40 mg total) by mouth 2 (two) times daily. 1 tablet by mouth 1-2 times a day   fluticasone  110 MCG/ACT inhaler Commonly known as: FLOVENT  HFA Inhale 2 puffs into the lungs every 4 (four) hours as needed (Take with albuterol  during asthma flare ups.). 2 inhalations every 4-6 hours as needed for cough, shortness of breath, wheezing and chest tightness.   fluticasone  50 MCG/ACT nasal spray Commonly known as: FLONASE  Place 2 sprays into both nostrils daily.   fluticasone -salmeterol 250-50 MCG/ACT Aepb Commonly known as: Wixela Inhub Inhale 1 puff into the lungs in the morning and at bedtime. 1 inhalations 1-2 times a day.   ipratropium-albuterol  0.5-2.5 (3) MG/3ML Soln Commonly known as: DUONEB Take 3 mLs by nebulization every 4 (four) hours as needed.   levocetirizine 5 MG tablet Commonly known as: XYZAL  Take 1 tablet (5 mg total) by mouth daily as needed.   metoprolol  succinate 50 MG 24 hr tablet Commonly known as: TOPROL -XL Take 1 tablet (50 mg total) by mouth at bedtime.   montelukast  10 MG tablet Commonly known as: SINGULAIR  Take 1 tablet (10 mg total) by mouth at bedtime.   Mucinex  DM 30-600 MG Tb12 Take 2 tablets by mouth 2 (two) times daily as needed (Use if needed  during flare ups.).   Nebulizer Mask Adult Misc 1 Device by Does not apply route as directed.   potassium chloride  10 MEQ tablet Commonly known as: KLOR-CON  Take by mouth.   sacubitril -valsartan  24-26 MG Commonly known as: ENTRESTO  Take 0.5 tablets by mouth 2 (two) times daily.   sertraline 100 MG tablet Commonly known as: ZOLOFT Take 50 mg by mouth every morning.   spironolactone  25 MG tablet Commonly known as: ALDACTONE  Take 25 mg by mouth daily.   tamsulosin 0.4 MG Caps capsule Commonly known as: FLOMAX Take 0.4 mg by mouth daily.        Past Medical History:  Diagnosis Date  .  Acid reflux   . Asthma   . Atypical chest pain   . Coronary artery disease   . Hyperlipidemia   . Hypertension   . Hypokalemia   . Hypopotassemia   . Obesity   . Recurrent upper respiratory infection (URI)     Past Surgical History:  Procedure Laterality Date  . ADENOIDECTOMY    . CARDIAC CATHETERIZATION    . CORONARY/GRAFT ACUTE MI REVASCULARIZATION N/A 06/19/2021   Procedure: Coronary/Graft Acute MI Revascularization;  Surgeon: Thukkani, Arun K, MD;  Location: [facility] INVASIVE [facility];  Service: Cardiovascular;  Laterality: N/A;  . HERNIA REPAIR    . LEFT HEART CATH AND CORONARY ANGIOGRAPHY N/A 06/19/2021   Procedure: LEFT HEART CATH AND CORONARY ANGIOGRAPHY;  Surgeon: Wendel Lurena POUR, MD;  Location: [facility] INVASIVE CV LAB;  Service: Cardiovascular;  Laterality: N/A;    Review of systems negative except as noted in HPI / PMHx or noted below:  ROS   Objective:   Vitals:   06/11/24 1527  BP: 104/64  Pulse: 70  Resp: 16  Temp: 97.8 F (36.6 C)  SpO2: 95%          Physical Exam  Diagnostics:    Spirometry was performed and demonstrated an FEV1 of *** at *** % of predicted.  The patient had an Asthma Control Test with the following results:  .    Assessment and Plan:   No diagnosis found.  Patient Instructions   1.  Continue famotidine  40 mg - 1 tablet 1-2 times per day  2.  Continue WIXELA 250 - 1 inhalations 1-2 times a day (empty lungs)  3.  Continue montelukast  10 mg - 1 tablet once a day  4.  If needed:   A.  Albuterol  + Fluticasone  110 -2 inhalations TOGETHER or nebulizer every 4-6 hours  B.  Mucinex  DM-2 tablets twice a day  C.  Antihistamine  D.  Flonase  - 1-2 sprays each nostril 1 time per day  E.  Saline nasal gel as needed for nasal dryness  5. For this recent episode:   A. Depomedrol 40 mg IM delivered in clinic today  B. Increase famotidine  to 2 times per day  6. Return to clinic in 6 months or earlier if problem  7. Influenza = Tamiflu.  Covid = Paxlovid   8. Plan for fall flu vaccine     Camellia Denis, MD Allergy / Immunology [facility]

## 2024-06-11 NOTE — Telephone Encounter (Signed)
 Cardiac Catheterization scheduled at [facility] for: Friday June 13, 2024 7:30 AM Arrival time [facility] Main Entrance A at: 5:30 AM  Diet: -Nothing to eat after midnight.   Hydration: -May drink clear liquids until 2 hours before the procedure.  Approved liquids: Water, clear tea, black coffee, fruit juices-non-citric and without pulp,Gatorade, plain Jello/popsicles.   -Please drink 16 oz of water 2 hours before procedure.  Medication instructions: -Hold:  Spironolactone /KCl-AM of procedure -Other usual morning medications can be taken including aspirin  81 mg and Plavix  75 mg.  Plan to go home the same day, you will only stay overnight if medically necessary.  You must have responsible adult to drive you home.  Someone must be with you the first 24 hours after you arrive home.  Reviewed procedure instructions with patient.

## 2024-06-11 NOTE — Patient Instructions (Addendum)
  1.  Continue famotidine  40 mg - 1 tablet 1-2 times per day  2.  Continue WIXELA 250 - 1 inhalations 1-2 times a day (empty lungs)  3.  Continue montelukast  10 mg - 1 tablet once a day  4.  If needed:   A.  Albuterol  + Fluticasone  110 -2 inhalations TOGETHER or nebulizer every 4-6 hours  B.  Mucinex  DM-2 tablets twice a day  C.  Antihistamine  D.  Flonase  - 1-2 sprays each nostril 1 time per day  E.  Saline nasal gel as needed for nasal dryness  5. For this recent episode:   A. Depomedrol 40 mg IM delivered in clinic today  B. Increase famotidine  to 2 times per day  6. Return to clinic in 6 months or earlier if problem  7. Influenza = Tamiflu. Covid = Paxlovid   8. Plan for fall flu vaccine

## 2024-06-12 ENCOUNTER — Encounter: Payer: Self-pay | Admitting: Allergy and Immunology

## 2024-06-12 MED ORDER — SODIUM CHLORIDE 0.9% FLUSH: 3.0000 mL | Freq: Two times a day (BID) | INTRAVENOUS | Status: DC

## 2024-06-12 MED ORDER — CLOPIDOGREL BISULFATE 75 MG PO TABS: 75.0000 mg | ORAL_TABLET | ORAL | Status: AC

## 2024-06-12 MED ORDER — SODIUM CHLORIDE 0.9 % IV SOLN: 250.0000 mL | INTRAVENOUS | Status: DC | PRN

## 2024-06-12 MED ORDER — ASPIRIN 81 MG PO CHEW: 81.0000 mg | CHEWABLE_TABLET | ORAL | Status: AC

## 2024-06-12 MED ORDER — FREE WATER: 500.0000 mL | Freq: Once | Status: DC

## 2024-06-12 MED ORDER — SODIUM CHLORIDE 0.9% FLUSH: 3.0000 mL | INTRAVENOUS | Status: DC | PRN

## 2024-06-13 ENCOUNTER — Other Ambulatory Visit: Payer: Self-pay

## 2024-06-13 ENCOUNTER — Ambulatory Visit (HOSPITAL_COMMUNITY)
Admission: RE | Admit: 2024-06-13 | Discharge: 2024-06-13 | Disposition: A | Discharge status: Home / Self Care | Attending: Internal Medicine | Admitting: Internal Medicine

## 2024-06-13 ENCOUNTER — Encounter (HOSPITAL_COMMUNITY)
Admission: RE | Disposition: A | Discharge status: Home / Self Care | Payer: Self-pay | Source: Home / Self Care | Attending: Internal Medicine

## 2024-06-13 DIAGNOSIS — I25119 Atherosclerotic heart disease of native coronary artery with unspecified angina pectoris: Secondary | ICD-10-CM | POA: Insufficient documentation

## 2024-06-13 DIAGNOSIS — Z7902 Long term (current) use of antithrombotics/antiplatelets: Secondary | ICD-10-CM | POA: Insufficient documentation

## 2024-06-13 DIAGNOSIS — E785 Hyperlipidemia, unspecified: Secondary | ICD-10-CM | POA: Diagnosis not present

## 2024-06-13 DIAGNOSIS — R7303 Prediabetes: Secondary | ICD-10-CM | POA: Insufficient documentation

## 2024-06-13 DIAGNOSIS — I2510 Atherosclerotic heart disease of native coronary artery without angina pectoris: Secondary | ICD-10-CM | POA: Insufficient documentation

## 2024-06-13 DIAGNOSIS — I25118 Atherosclerotic heart disease of native coronary artery with other forms of angina pectoris: Secondary | ICD-10-CM

## 2024-06-13 DIAGNOSIS — I252 Old myocardial infarction: Secondary | ICD-10-CM | POA: Diagnosis not present

## 2024-06-13 DIAGNOSIS — N182 Chronic kidney disease, stage 2 (mild): Secondary | ICD-10-CM | POA: Insufficient documentation

## 2024-06-13 DIAGNOSIS — Z79899 Other long term (current) drug therapy: Secondary | ICD-10-CM | POA: Diagnosis not present

## 2024-06-13 DIAGNOSIS — Z006 Encounter for examination for normal comparison and control in clinical research program: Secondary | ICD-10-CM

## 2024-06-13 DIAGNOSIS — Y831 Surgical operation with implant of artificial internal device as the cause of abnormal reaction of the patient, or of later complication, without mention of misadventure at the time of the procedure: Secondary | ICD-10-CM | POA: Insufficient documentation

## 2024-06-13 DIAGNOSIS — I420 Dilated cardiomyopathy: Secondary | ICD-10-CM | POA: Diagnosis not present

## 2024-06-13 DIAGNOSIS — I255 Ischemic cardiomyopathy: Secondary | ICD-10-CM | POA: Diagnosis not present

## 2024-06-13 DIAGNOSIS — I129 Hypertensive chronic kidney disease with stage 1 through stage 4 chronic kidney disease, or unspecified chronic kidney disease: Secondary | ICD-10-CM | POA: Insufficient documentation

## 2024-06-13 DIAGNOSIS — Z9861 Coronary angioplasty status: Secondary | ICD-10-CM

## 2024-06-13 DIAGNOSIS — T82855A Stenosis of coronary artery stent, initial encounter: Secondary | ICD-10-CM | POA: Diagnosis not present

## 2024-06-13 DIAGNOSIS — I209 Angina pectoris, unspecified: Secondary | ICD-10-CM | POA: Diagnosis present

## 2024-06-13 HISTORY — PX: CORONARY BALLOON ANGIOPLASTY: CATH118233

## 2024-06-13 HISTORY — PX: CORONARY ANGIOGRAPHY: CATH118303

## 2024-06-13 HISTORY — PX: CORONARY IMAGING/OCT: CATH118326

## 2024-06-13 LAB — BASIC METABOLIC PANEL WITH GFR
Anion gap: 10 (ref 5–15)
BUN: 12 mg/dL (ref 8–23)
CO2: 22 mmol/L (ref 22–32)
Calcium: 8.9 mg/dL (ref 8.9–10.3)
Chloride: 105 mmol/L (ref 98–111)
Creatinine, Ser: 1.3 mg/dL — ABNORMAL HIGH (ref 0.61–1.24)
GFR, Estimated: >60 mL/min (ref >60)
Glucose, Bld: 120 mg/dL — ABNORMAL HIGH (ref 70–99)
Potassium: 3.8 mmol/L (ref 3.5–5.1)
Sodium: 137 mmol/L (ref 135–145)

## 2024-06-13 LAB — POCT ACTIVATED CLOTTING TIME
Activated Clotting Time: 268 s
Activated Clotting Time: 273 s

## 2024-06-13 SURGERY — CORONARY ANGIOGRAPHY (CATH LAB)
Anesthesia: LOCAL

## 2024-06-13 MED ORDER — LIDOCAINE HCL (PF) 1 % IJ SOLN
INTRAMUSCULAR | Status: DC | PRN
Start: 2024-06-13 — End: 2024-06-13
  Administered 2024-06-13: 2 mL via INTRADERMAL

## 2024-06-13 MED ORDER — FENTANYL CITRATE (PF) 100 MCG/2ML IJ SOLN
INTRAMUSCULAR | Status: DC | PRN
  Administered 2024-06-13: 25 ug via INTRAVENOUS

## 2024-06-13 MED ORDER — SODIUM CHLORIDE 0.9% FLUSH: 3.0000 mL | Freq: Two times a day (BID) | INTRAVENOUS | Status: DC

## 2024-06-13 MED ORDER — HEPARIN (PORCINE) IN NACL 1000-0.9 UT/500ML-% IV SOLN
INTRAVENOUS | Status: DC | PRN
  Administered 2024-06-13: 1000 mL

## 2024-06-13 MED ORDER — HEPARIN (PORCINE) IN NACL 2-0.9 UNITS/ML
INTRAMUSCULAR | Status: DC | PRN
  Administered 2024-06-13: 5 mL via INTRA_ARTERIAL

## 2024-06-13 MED ORDER — HEPARIN SODIUM (PORCINE) 1000 UNIT/ML IJ SOLN
INTRAMUSCULAR | Status: DC | PRN
Start: 2024-06-13 — End: 2024-06-13
  Administered 2024-06-13: 5000 [IU] via INTRA_ARTERIAL

## 2024-06-13 MED ORDER — SODIUM CHLORIDE 0.9 % IV SOLN: 250.0000 mL | INTRAVENOUS | Status: DC | PRN

## 2024-06-13 MED ORDER — CLOPIDOGREL BISULFATE 75 MG PO TABS: 75.0000 mg | ORAL_TABLET | Freq: Every day | ORAL | Status: DC

## 2024-06-13 MED ORDER — HYDRALAZINE HCL 20 MG/ML IJ SOLN: 10.0000 mg | INTRAMUSCULAR | Status: DC | PRN

## 2024-06-13 MED ORDER — HEPARIN SODIUM (PORCINE) 1000 UNIT/ML IJ SOLN
INTRAMUSCULAR | Status: AC
Start: 2024-06-13 — End: 2024-06-13
  Filled 2024-06-13: qty 10

## 2024-06-13 MED ORDER — ASPIRIN 81 MG PO TBEC: 81.0000 mg | DELAYED_RELEASE_TABLET | Freq: Every day | ORAL | 2 refills | Status: DC

## 2024-06-13 MED ORDER — CLOPIDOGREL BISULFATE 75 MG PO TABS: 75.0000 mg | ORAL_TABLET | Freq: Every day | ORAL | 6 refills | Status: DC

## 2024-06-13 MED ORDER — HEPARIN SODIUM (PORCINE) 1000 UNIT/ML IJ SOLN
INTRAMUSCULAR | Status: DC | PRN
  Administered 2024-06-13: 1000 [IU] via INTRAVENOUS
  Administered 2024-06-13: 8000 [IU] via INTRAVENOUS

## 2024-06-13 MED ORDER — FREE WATER: 500.0000 mL | Freq: Once | Status: DC

## 2024-06-13 MED ORDER — IOHEXOL 350 MG/ML SOLN
INTRAVENOUS | Status: DC | PRN
  Administered 2024-06-13: 65 mL

## 2024-06-13 MED ORDER — ASPIRIN 81 MG PO CHEW
81.0000 mg | CHEWABLE_TABLET | Freq: Every day | ORAL | Status: DC
Start: 2024-06-14 — End: 2024-06-13

## 2024-06-13 MED ORDER — ACETAMINOPHEN 325 MG PO TABS: 650.0000 mg | ORAL_TABLET | ORAL | Status: DC | PRN

## 2024-06-13 MED ORDER — MIDAZOLAM HCL 2 MG/2ML IJ SOLN
INTRAMUSCULAR | Status: AC
  Filled 2024-06-13: qty 2

## 2024-06-13 MED ORDER — MIDAZOLAM HCL (PF) 2 MG/2ML IJ SOLN
INTRAMUSCULAR | Status: DC | PRN
  Administered 2024-06-13: 1 mg via INTRAVENOUS

## 2024-06-13 MED ORDER — FENTANYL CITRATE (PF) 100 MCG/2ML IJ SOLN
INTRAMUSCULAR | Status: AC
  Filled 2024-06-13: qty 2

## 2024-06-13 MED ORDER — HEPARIN SODIUM (PORCINE) 1000 UNIT/ML IJ SOLN
INTRAMUSCULAR | Status: AC
  Filled 2024-06-13: qty 10

## 2024-06-13 MED ORDER — SODIUM CHLORIDE 0.9% FLUSH: 3.0000 mL | INTRAVENOUS | Status: DC | PRN

## 2024-06-13 MED ORDER — VERAPAMIL HCL 2.5 MG/ML IV SOLN
INTRAVENOUS | Status: AC
  Filled 2024-06-13: qty 2

## 2024-06-13 MED ORDER — LABETALOL HCL 5 MG/ML IV SOLN: 10.0000 mg | INTRAVENOUS | Status: DC | PRN

## 2024-06-13 MED ORDER — ONDANSETRON HCL 4 MG/2ML IJ SOLN: 4.0000 mg | Freq: Four times a day (QID) | INTRAMUSCULAR | Status: DC | PRN

## 2024-06-13 SURGICAL SUPPLY — 17 items
BALL DC AGENT 3.0X30 (BALLOONS) IMPLANT
BALLOON WOLVERINE 3.00X15 (BALLOONS) IMPLANT
BALLOON ~~LOC~~ EMERGE MR 3.25X15 (BALLOONS) IMPLANT
CATH DRAGONFLY OPSTAR (CATHETERS) IMPLANT
CATH INFINITI JR4 5F (CATHETERS) IMPLANT
CATH VISTA GUIDE 6FR XB3.5 EPK (CATHETERS) IMPLANT
DEVICE RAD COMP TR BAND LRG (VASCULAR PRODUCTS) IMPLANT
GLIDESHEATH SLEND SS 6F .021 (SHEATH) IMPLANT
GUIDEWIRE VAS SION BLUE 190 (WIRE) IMPLANT
KIT ENCORE 26 ADVANTAGE (KITS) IMPLANT
KIT ESSENTIALS PG (KITS) IMPLANT
KIT HEMO VALVE WATCHDOG (MISCELLANEOUS) IMPLANT
KIT SYRINGE INJ CVI SPIKEX1 (MISCELLANEOUS) IMPLANT
PACK CARDIAC CATHETERIZATION (CUSTOM PROCEDURE TRAY) ×2 IMPLANT
SET ATX-X65L (MISCELLANEOUS) IMPLANT
SHEATH PINNACLE ST 6F 65CM (SHEATH) IMPLANT
WIRE EMERALD 3MM-J .035X260CM (WIRE) IMPLANT

## 2024-06-13 NOTE — Interval H&P Note (Signed)
 History and Physical Interval Note:  06/13/2024 6:38 AM  Carlin ORN Dethloff  has presented today for surgery, with the diagnosis of angina.  The various methods of treatment have been discussed with the patient and family. After consideration of risks, benefits and other options for treatment, the patient has consented to  Procedure(s): LEFT HEART CATH AND CORONARY ANGIOGRAPHY (N/A) as a surgical intervention.  The patient's history has been reviewed, patient examined, no change in status, stable for surgery.  I have reviewed the patient's chart and labs.  Questions were answered to the patient's satisfaction.     Olivene Cookston K Darwin Guastella

## 2024-06-13 NOTE — Discharge Summary (Signed)
 Discharge Summary for Same Day PCI   Patient ID: Harold Warner MRN: [id number]; DOB: [birth year]  Admit date: 06/13/2024 Discharge date: 06/13/2024  Primary Care Provider: [facility]  Primary Cardiologist: Lurena MARLA Red, MD  Primary Electrophysiologist:  None   Discharge Diagnoses    Active Problems:   CAD (coronary artery disease)   Diagnostic Studies/Procedures    Cardiac Catheterization 06/13/2024:    Mid LM lesion is 20% stenosed.   Prox LAD lesion is 90% stenosed.   Post intervention, there is a 0% residual stenosis.   1.  Right upper extremity tortuosity requiring 65 cm destination sheath for coronary cannulation.  Consider longer destination sheath in the future. 2.  High-grade ISR of proximal LAD stent.  The LAD stent looks to be relatively undersized by OCT analysis.  Cutting Balloon angioplasty followed by drug-coated balloon angioplasty with a 3.0 mm agent balloon with prolonged inflation was performed.   Recommendation: Dual antiplatelet therapy with aspirin  and Plavix  for 6 months followed by Plavix  monotherapy indefinitely.  Same-day discharge after 6-hour bedrest.  Diagnostic Dominance: Right  Intervention   _____________   History of Present Illness     Harold Warner is a 63 y.o. male with past medical history of CAD s/p PCI of LAD complicated by VF arrest '22, ischemic cardiomyopathy, hypertension, hyperlipidemia, CKD stage II, OSA on CPAP, prediabetes.  Known history of anterior STEMI in 2022 with PCI of the LAD which was complicated by VF arrest.  Echocardiogram with LVEF of 40 to 45%.  He was started on Entresto  and spironolactone .  Repeat echocardiogram showed improved LV function.  He is followed by cardiology at the [facility] and underwent cardiac catheterization earlier this month and was found to have moderate in-stent restenosis in the proximal LAD.  He was seen by Dr. Red 10/14 to further discuss plans for cardiac  catheterization.  He was continued on Plavix  and scheduled for outpatient cardiac catheterization.  Hospital Course     The patient underwent cardiac cath as noted above with high-grade in-stent restenosis to the proximal LAD treated with Cutting Balloon angioplasty OTC guidance. Plan for DAPT with ASA/Plavix  for at least 6 months. The patient was seen by cardiac rehab while in short stay. There were no observed complications post cath. Radial cath site was re-evaluated prior to discharge and found to be stable without any complications. Instructions/precautions regarding cath site care were given prior to discharge.  Harold Warner was seen by Dr. Red and determined stable for discharge home. Follow up with our office has been arranged. Medications are listed below. Pertinent changes include n/a. _____________  Cath/PCI Registry Performance & Quality Measures: Aspirin  prescribed? - Yes ADP Receptor Inhibitor (Plavix /Clopidogrel , Brilinta /Ticagrelor  or Effient/Prasugrel) prescribed (includes medically managed patients)? - Yes High Intensity Statin (Lipitor  40-80mg  or Crestor 20-40mg ) prescribed? - Yes For EF <40%, was ACEI/ARB prescribed? - Yes For EF <40%, Aldosterone Antagonist (Spironolactone  or Eplerenone) prescribed? - Yes Cardiac Rehab Phase II ordered (Included Medically managed Patients)? - Yes  _____________   Discharge Vitals Blood pressure 104/63, pulse (!) 55, temperature 98 F (36.7 C), temperature source Oral, resp. rate 16, height 5' 7 (1.702 m), weight 113.9 kg, SpO2 93%.  Filed Weights   06/13/24 0725  Weight: 113.9 kg    Last Labs & Radiologic Studies    CBC No results for input(s): WBC, NEUTROABS, HGB, HCT, MCV, PLT in the last 72 hours. Basic Metabolic Panel Recent Labs    89/75/74 0628  NA  137  K 3.8  CL 105  CO2 22  GLUCOSE 120*  BUN 12  CREATININE 1.30*  CALCIUM  8.9   Liver Function Tests No results for input(s): AST, ALT,  ALKPHOS, BILITOT, PROT, ALBUMIN in the last 72 hours. No results for input(s): LIPASE, AMYLASE in the last 72 hours. High Sensitivity Troponin:   Recent Labs  Lab 05/30/24 1738 05/30/24 1921  TROPONINIHS 7 6    BNP Invalid input(s): POCBNP D-Dimer No results for input(s): DDIMER in the last 72 hours. Hemoglobin A1C No results for input(s): HGBA1C in the last 72 hours. Fasting Lipid Panel No results for input(s): CHOL, HDL, LDLCALC, TRIG, CHOLHDL, LDLDIRECT in the last 72 hours. Thyroid Function Tests No results for input(s): TSH, T4TOTAL, T3FREE, THYROIDAB in the last 72 hours.  Invalid input(s): FREET3 _____________  CARDIAC CATHETERIZATION Result Date: 06/13/2024   Mid LM lesion is 20% stenosed.   Prox LAD lesion is 90% stenosed.   Post intervention, there is a 0% residual stenosis. 1.  Right upper extremity tortuosity requiring 65 cm destination sheath for coronary cannulation.  Consider longer destination sheath in the future. 2.  High-grade ISR of proximal LAD stent.  The LAD stent looks to be relatively undersized by OCT analysis.  Cutting Balloon angioplasty followed by drug-coated balloon angioplasty with a 3.0 mm agent balloon with prolonged inflation was performed. Recommendation: Dual antiplatelet therapy with aspirin  and Plavix  for 6 months followed by Plavix  monotherapy indefinitely.  Same-day discharge after 6-hour bedrest.  CT Angio Chest PE W and/or Wo Contrast Result Date: 05/30/2024 CLINICAL DATA:  Chest pain and cough EXAM: CT ANGIOGRAPHY CHEST WITH CONTRAST TECHNIQUE: Multidetector CT imaging of the chest was performed using the standard protocol during bolus administration of intravenous contrast. Multiplanar CT image reconstructions and MIPs were obtained to evaluate the vascular anatomy. RADIATION DOSE REDUCTION: This exam was performed according to the departmental dose-optimization program which includes automated exposure  control, adjustment of the mA and/or kV according to patient size and/or use of iterative reconstruction technique. CONTRAST:  80mL OMNIPAQUE  IOHEXOL  350 MG/ML SOLN COMPARISON:  05/30/2024 FINDINGS: Cardiovascular: Thoracic aorta and its branches are within normal limits. No aneurysmal dilatation is seen. Scattered atherosclerotic calcifications are noted. Coronary calcifications are seen. The heart is not significantly enlarged. The pulmonary artery shows a normal branching pattern bilaterally. No intraluminal filling defect to suggest pulmonary embolism is seen. Mediastinum/Nodes: Thoracic inlet is within normal limits. No hilar or mediastinal adenopathy is noted. The esophagus as visualized is within normal limits. Lungs/Pleura: The lungs are well aerated bilaterally. Calcified granuloma is noted in the posterior left upper lobe. No sizable nodule is seen. No focal infiltrate or effusion is noted. Upper Abdomen: Visualized upper abdomen shows no acute abnormality. Musculoskeletal: Mild degenerative changes of the thoracic spine are seen. Review of the MIP images confirms the above findings. IMPRESSION: No evidence of pulmonary emboli. Changes of prior granulomatous disease. No other is seen. Aortic Atherosclerosis (ICD10-I70.0). Electronically Signed   By: Oneil Devonshire M.D.   On: 05/30/2024 20:16   DG Chest 2 View Result Date: 05/30/2024 CLINICAL DATA:  Chest pain, cough EXAM: CHEST - 2 VIEW COMPARISON:  06/19/2021 FINDINGS: Heart and mediastinal contours are within normal limits. No focal opacities or effusions. No acute bony abnormality. No pneumothorax. IMPRESSION: No active cardiopulmonary disease. Electronically Signed   By: Franky Crease M.D.   On: 05/30/2024 18:24    Disposition   Pt is being discharged home today in good condition.  Follow-up Plans &  Appointments     Discharge Instructions     Amb Referral to Cardiac Rehabilitation   Complete by: As directed    Diagnosis: PTCA   After  initial evaluation and assessments completed: Virtual Based Care may be provided alone or in conjunction with Phase 2 Cardiac Rehab based on patient barriers.: Yes   Intensive Cardiac Rehabilitation (ICR) [facility] location only OR Traditional Cardiac Rehabilitation (TCR) *If criteria for ICR are not met will enroll in TCR [facility] only): Yes        Discharge Medications   Allergies as of 06/13/2024       Reactions   Adhesive [tape] Rash        Medication List     TAKE these medications    albuterol  (2.5 MG/3ML) 0.083% nebulizer solution Commonly known as: PROVENTIL  Take 3 mLs (2.5 mg total) by nebulization every 4 (four) hours as needed for wheezing or shortness of breath.   albuterol  108 (90 Base) MCG/ACT inhaler Commonly known as: VENTOLIN  HFA 2 inhalations every 4-6 hours as needed for cough, wheeze, shortness of breath and chest tightness.   aspirin  EC 81 MG tablet Take 81 mg by mouth once. Swallow whole.   atorvastatin  80 MG tablet Commonly known as: LIPITOR  Take 1 tablet (80 mg total) by mouth daily.   benzonatate 100 MG capsule Commonly known as: TESSALON Take 200 mg by mouth.   budesonide  0.5 MG/2ML nebulizer solution Commonly known as: Pulmicort  Use Pulmicort  0.5mg  twice daily for 1-2 weeks.   busPIRone 10 MG tablet Commonly known as: BUSPAR Take 10 mg by mouth daily.   cetirizine  10 MG tablet Commonly known as: ZYRTEC  Take 1 tablet (10 mg total) by mouth daily as needed for allergies (Can use an extra dose during flares).   Cholecalciferol 50 MCG (2000 UT) Tabs Take 2,000 Units by mouth daily.   clopidogrel  75 MG tablet Commonly known as: PLAVIX  Take 1 tablet (75 mg total) by mouth daily.   famotidine  40 MG tablet Commonly known as: PEPCID  Take 1 tablet (40 mg total) by mouth 2 (two) times daily. 1 tablet by mouth 1-2 times a day   fluticasone  110 MCG/ACT inhaler Commonly known as: FLOVENT  HFA Inhale 2 puffs into the lungs every 4 (four) hours as  needed (Take with albuterol  during asthma flare ups.). 2 inhalations every 4-6 hours as needed for cough, shortness of breath, wheezing and chest tightness.   fluticasone  50 MCG/ACT nasal spray Commonly known as: FLONASE  Place 2 sprays into both nostrils daily.   fluticasone -salmeterol 250-50 MCG/ACT Aepb Commonly known as: Wixela Inhub Inhale 1 puff into the lungs in the morning and at bedtime. 1 inhalations 1-2 times a day.   ipratropium-albuterol  0.5-2.5 (3) MG/3ML Soln Commonly known as: DUONEB Take 3 mLs by nebulization every 4 (four) hours as needed.   levocetirizine 5 MG tablet Commonly known as: XYZAL  Take 1 tablet (5 mg total) by mouth daily as needed.   metoprolol  succinate 50 MG 24 hr tablet Commonly known as: TOPROL -XL Take 1 tablet (50 mg total) by mouth at bedtime.   montelukast  10 MG tablet Commonly known as: SINGULAIR  Take 1 tablet (10 mg total) by mouth at bedtime.   Mucinex  DM 30-600 MG Tb12 Take 2 tablets by mouth 2 (two) times daily as needed (Use if needed during flare ups.).   Nebulizer Mask Adult Misc 1 Device by Does not apply route as directed.   potassium chloride  10 MEQ tablet Commonly known as: KLOR-CON  Take by mouth.  sacubitril -valsartan  24-26 MG Commonly known as: ENTRESTO  Take 0.5 tablets by mouth 2 (two) times daily.   sertraline 100 MG tablet Commonly known as: ZOLOFT Take 50 mg by mouth every morning.   spironolactone  25 MG tablet Commonly known as: ALDACTONE  Take 25 mg by mouth daily.   tamsulosin 0.4 MG Caps capsule Commonly known as: FLOMAX Take 0.4 mg by mouth daily.           Allergies Allergies  Allergen Reactions   Adhesive [Tape] Rash    Outstanding Labs/Studies   N/a   Duration of Discharge Encounter   Greater than 30 minutes including physician time.  Signed, Manuelita Rummer, NP 06/13/2024, 11:50 AM

## 2024-06-13 NOTE — Progress Notes (Signed)
 Patient and patient mother given discharge instructions, education provided no further questions at this time. Patient able to ambulate and void before discharge. Able to tolerate PO intake. Patient site is clean, dry, intact with no hematoma noted upon discharge.

## 2024-06-13 NOTE — Research (Signed)
 Prevail Informed Consent   Subject Name: Harold Warner  Subject met inclusion and exclusion criteria.  The informed consent form, study requirements and expectations were reviewed with the subject and questions and concerns were addressed prior to the signing of the consent form.  The subject verbalized understanding of the trial requirements.  The subject agreed to participate in the Prevail trial and signed the informed consent at 0555 on 06/13/2024.  The informed consent was obtained prior to performance of any protocol-specific procedures for the subject.  A copy of the signed informed consent was given to the subject and a copy was placed in the subject's medical record.   [name]

## 2024-06-13 NOTE — Progress Notes (Signed)
 Pt was educated on PTCA Antiplatelet and ASA use, wt restrictions, no baths/daily wash-ups, s/s of infection, ex guidelines, s/s to stop exercising, NTG use and calling 911, heart healthy diet, risk factors and CRPII. Pt received materials on exercise, diet, and CRPII. Will refer to [facility].

## 2024-06-16 ENCOUNTER — Encounter (HOSPITAL_COMMUNITY): Payer: Self-pay | Admitting: Internal Medicine

## 2024-06-17 ENCOUNTER — Telehealth (HOSPITAL_COMMUNITY): Payer: Self-pay

## 2024-06-17 NOTE — Telephone Encounter (Signed)
 Phase II referral. Called patient to confirm interest in cardiac rehab, patient states he has done the program before and would like to do it but is not able to do it until he retires due to his work schedule. Patient states he will likely retire in March or April of 2026 and call us  back then regarding the program.  Closing referral.

## 2024-06-23 NOTE — Progress Notes (Addendum)
 Cardiology Office Note:   Date:  06/25/2024  ID:  Harold Warner, DOB 06/21/1961, MRN [id number] PCP:  [facility]  [facility] Providers Cardiologist:  Wendel Haws, MD Referring MD: [facility]  Chief Complaint/Reason for Referral: Cardiology follow-up ASSESSMENT:    1. Coronary artery disease without angina pectoris, unspecified vessel or lesion type, unspecified whether native or transplanted heart   2. Ischemic congestive cardiomyopathy (HCC)   3. Hyperlipidemia LDL goal <55   4. Essential hypertension   5. CKD (chronic kidney disease) stage 2, GFR 60-89 ml/min   6. BMI 38.0-38.9,adult   7. Prediabetes   8. Research study patient       PLAN:   In order of problems listed above: Coronary artery disease: Continue aspirin  81 mg, clopidogrel  75 mg, metoprolol  50 mg, atorvastatin  80 mg.  Continue DAPT for 6 months followed by Plavix  monotherapy indefinitely.  Will follow at [facility].  Follow with me as needed. Ischemic cardiomyopathy: Continue Entresto  49 x 51 twice daily, spironolactone  25 mg, Toprol  50 mg Hyperlipidemia: Continue atorvastatin  80 mg; lipids [facility] by [facility] Hypertension: Continue Toprol  50 mg, Entresto  49 x 51 twice daily, spironolactone  25 mg.  BP is well-controlled today CKD stage II: Continue Entresto  49 x 51 mg twice daily Elevated BMI: Diet and exercise modification Prediabetes: Continue to control cardiovascular risk factors Research study patient:  Enrolled in the PREVAIL DCB trial.           Dispo:  Return if symptoms worsen or fail to improve.      Medication Adjustments/Labs and Tests Ordered: Current medicines are reviewed at length with the patient today.  Concerns regarding medicines are outlined above.  The following changes have been made:  no change   Labs/tests ordered: No orders of the defined types were placed in this encounter.   Medication Changes: No orders of the defined types were placed in  this encounter.   Current medicines are reviewed at length with the patient today.  The patient does not have concerns regarding medicines.  I spent 39 minutes reviewing all clinical data during and prior to this visit including all relevant imaging studies, laboratories, clinical information from other health systems and prior notes from both Cardiology and other specialties, interviewing the patient, conducting a complete physical examination, and coordinating care in order to formulate a comprehensive and personalized evaluation and treatment plan.   History of Present Illness:      FOCUSED PROBLEM LIST:   Anterior STEMI 2022 PCI pLAD complicated by VF arrest Moderate ISR pLAD stent, coronary angiography [facility] October 2025 Cutting Balloon and drug-coated balloon angioplasty with OCT guidance October 2025 PREVAIL trial participant Ischemic cardiomyopathy EF 40 to 45% without valve problems TTE January 2024 Hypertension Hyperlipidemia LP(a) 56.7 CKD stage II BMI 38 OSA on CPAP Prediabetes   7/24:  The patient is a 63 y.o. male with the indicated medical history here for routine cardiology follow-up.   The patient had previously been cared for by Dr. [name].  The patient was last seen in February of this year in response to an echocardiogram done in December due to shortness of breath.  This demonstrated ejection fraction of 40 to 45%.  The patient's Brilinta  was switched to clopidogrel  due to shortness of breath; Entresto  was started and eventually uptitrated and spironolactone  was also initiated.  A repeat echocardiogram in June of this year demonstrated normalized LV function.  The patient is doing well.  He denies any chest pain,  exertional angina, or exertional dyspnea.  He has not been as active as he was when he was participating in cardiac rehabilitation.  He is gaining good amount of weight per his own admission.  He however has not required any hospitalizations or emergency  room visits for cardiovascular issues.  He is tolerating his atorvastatin  and dual antiplatelet therapy well.  Plan: Stop aspirin  and continue Plavix  monotherapy indefinitely; start Jardiance  10 mg daily, check lipid panel, LFTs, LP(a), change Toprol  to 50 mg at bedtime.  Check hemoglobin A1c.  1/25: In the interim the patient's lipid panel was at goal.  His LP(a) was relatively low and his hemoglobin A1c was 6.2.  The patient is doing well from a cardiovascular standpoint.  He denies any cardiovascular complaints.  His biggest complaint today is a URI.  He saw his PCP recently with a productive cough.  He was prescribed Pulmicort  which has helped.  He is otherwise well and without significant complaints today.  Plan: Continue current therapy.  October 2025:  Patient consents to use of AI scribe. In the interim the patient underwent coronary angiography at [facility] which demonstrated  in-stent restenosis of previously placed LAD stent.  He is here to discuss further since he is has episodes of angina at times.  He reports a blockage in his stent, possibly due to scar tissue, causing a dull ache periodically, sometimes when walking. He has not used nitroglycerin  for these symptoms but carries it with him. He is on Plavix  and is concerned about a significant blockage. 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  He has had a persistent cough for about four weeks, which he attributes to long COVID. He was prescribed amoxicillin, but the cough is thought to be viral in nature. The cough causes dizziness and near blackouts, impacting his ability to work, leading to time off since early October.  He works at the post office, lifting heavy packages, and has been off work due to his cough. No chest pain with every walk and has not experienced any bad bleeding or bruising.  Plan: Refer for PCI.  November 2025:  Patient consents  to use of AI scribe. In the interim the patient underwent OCT guided cutting and drug-coated balloon angioplasty of the in-stent restenosis of the proximal LAD stent placed in 2022.  This was an uncomplicated procedure.  He returns for follow-up.  He feels significantly better following the cardiac procedure, with noticeable improvement in his overall condition. He recalls experiencing a cardiac arrest during a previous procedure, which required defibrillation to restore his heart rhythm. Upon waking, he described the sensation as feeling like 'a horse kicked me.'  He has a hematoma that was massaged down. The hematoma was massaged down. He states that he did not used to bruise easily.  He is currently taking Plavix  and aspirin  without any issues. He is also following up with a cardiologist at the [facility]. No problems with his current medications, specifically Plavix  and aspirin , were reported.          Current Medications: Current Meds  Medication Sig   albuterol  (PROVENTIL ) (2.5 MG/3ML) 0.083% nebulizer solution Take 3 mLs (2.5 mg total) by nebulization every 4 (four) hours as needed for wheezing or shortness of breath.   albuterol  (VENTOLIN  HFA) 108 (90 Base) MCG/ACT inhaler 2 inhalations every 4-6 hours as needed for cough, wheeze, shortness of breath and chest tightness.  aspirin  EC 81 MG tablet Take 81 mg by mouth once. Swallow whole.   atorvastatin  (LIPITOR ) 80 MG tablet Take 1 tablet (80 mg total) by mouth daily.   benzonatate (TESSALON) 100 MG capsule Take 200 mg by mouth.   busPIRone (BUSPAR) 10 MG tablet Take 10 mg by mouth daily.   cetirizine  (ZYRTEC ) 10 MG tablet Take 1 tablet (10 mg total) by mouth daily as needed for allergies (Can use an extra dose during flares).   Cholecalciferol 50 MCG (2000 UT) TABS Take 2,000 Units by mouth daily.   clopidogrel  (PLAVIX ) 75 MG tablet Take 1 tablet (75 mg total) by mouth daily.   famotidine  (PEPCID ) 40 MG tablet Take 1 tablet (40 mg total) by  mouth 2 (two) times daily. 1 tablet by mouth 1-2 times a day   fluticasone  (FLONASE ) 50 MCG/ACT nasal spray Place 2 sprays into both nostrils daily.   fluticasone -salmeterol (WIXELA INHUB) 250-50 MCG/ACT AEPB Inhale 1 puff into the lungs in the morning and at bedtime. 1 inhalations 1-2 times a day.   ipratropium-albuterol  (DUONEB) 0.5-2.5 (3) MG/3ML SOLN Take 3 mLs by nebulization every 4 (four) hours as needed.   metoprolol  succinate (TOPROL -XL) 50 MG 24 hr tablet Take 1 tablet (50 mg total) by mouth at bedtime.   montelukast  (SINGULAIR ) 10 MG tablet Take 1 tablet (10 mg total) by mouth at bedtime.   potassium chloride  (KLOR-CON ) 10 MEQ tablet Take by mouth.   Respiratory Therapy Supplies (NEBULIZER MASK ADULT) MISC 1 Device by Does not apply route as directed.   sacubitril -valsartan  (ENTRESTO ) 24-26 MG Take 0.5 tablets by mouth 2 (two) times daily.   sertraline (ZOLOFT) 100 MG tablet Take 50 mg by mouth every morning.   spironolactone  (ALDACTONE ) 25 MG tablet Take 25 mg by mouth daily.   tamsulosin (FLOMAX) 0.4 MG CAPS capsule Take 0.4 mg by mouth daily.     Review of Systems:   Please see the history of present illness.    All other systems reviewed and are negative.     EKGs/Labs/Other Test Reviewed:   EKG:  EKG performed November 2023 that I personally reviewed demonstrates sinus rhythm with occasional PVCs and anterior T wave inversions   EKG Interpretation Date/Time:    Ventricular Rate:    PR Interval:    QRS Duration:    QT Interval:    QTC Calculation:   R Axis:      Text Interpretation:           Risk Assessment/Calculations:          Physical Exam:   VS:  BP 110/70 (BP Location: Right Arm, Patient Position: Sitting, Cuff Size: Large)   Pulse 62   Ht 5' 7 (1.702 m)   Wt 253 lb 12.8 oz (115.1 kg)   SpO2 95%   BMI 39.75 kg/m        Wt Readings from Last 3 Encounters:  06/25/24 253 lb 12.8 oz (115.1 kg)  06/13/24 251 lb (113.9 kg)  06/03/24 255 lb 6.4 oz  (115.8 kg)      GENERAL:  No apparent distress, AOx3 HEENT:  No carotid bruits, +2 carotid impulses, no scleral icterus CAR: RRR  no murmurs, gallops, rubs, or thrills RES:  Clear to auscultation bilaterally ABD:  Soft, nontender, nondistended, positive bowel sounds x 4 VASC:  +2 radial pulses, +2 carotid pulses NEURO:  CN 2-12 grossly intact; motor and sensory grossly intact PSYCH:  No active depression or anxiety EXT: Mild bruising of right  upper extremity  Signed, Lurena MARLA Red, MD  06/25/2024 12:11 PM    [facility] [address] [place], [place], [place]  [address] Phone: [phone]; Fax: [phone]   Note:  This document was prepared using Dragon voice recognition software and may include unintentional dictation errors.

## 2024-06-25 ENCOUNTER — Encounter: Payer: Self-pay | Admitting: Internal Medicine

## 2024-06-25 ENCOUNTER — Ambulatory Visit: Admitting: Physician Assistant

## 2024-06-25 ENCOUNTER — Ambulatory Visit: Attending: Internal Medicine | Admitting: Internal Medicine

## 2024-06-25 VITALS — BP 110/70 | HR 62 | Ht 67.0 in | Wt 253.8 lb

## 2024-06-25 DIAGNOSIS — I255 Ischemic cardiomyopathy: Secondary | ICD-10-CM | POA: Diagnosis not present

## 2024-06-25 DIAGNOSIS — N182 Chronic kidney disease, stage 2 (mild): Secondary | ICD-10-CM

## 2024-06-25 DIAGNOSIS — R7303 Prediabetes: Secondary | ICD-10-CM

## 2024-06-25 DIAGNOSIS — I2510 Atherosclerotic heart disease of native coronary artery without angina pectoris: Secondary | ICD-10-CM | POA: Diagnosis not present

## 2024-06-25 DIAGNOSIS — I10 Essential (primary) hypertension: Secondary | ICD-10-CM

## 2024-06-25 DIAGNOSIS — E785 Hyperlipidemia, unspecified: Secondary | ICD-10-CM

## 2024-06-25 DIAGNOSIS — Z006 Encounter for examination for normal comparison and control in clinical research program: Secondary | ICD-10-CM

## 2024-06-25 DIAGNOSIS — I420 Dilated cardiomyopathy: Secondary | ICD-10-CM

## 2024-06-25 DIAGNOSIS — Z6838 Body mass index (BMI) 38.0-38.9, adult: Secondary | ICD-10-CM

## 2024-06-25 NOTE — Patient Instructions (Signed)
 Medication Instructions:  No medication changes were made at this visit. Continue current regimen.   *If you need a refill on your cardiac medications before your next appointment, please call your pharmacy*  Lab Work: None ordered today. If you have labs (blood work) drawn today and your tests are completely normal, you will receive your results only by: MyChart Message (if you have MyChart) OR A paper copy in the mail If you have any lab test that is abnormal or we need to change your treatment, we will call you to review the results.  Testing/Procedures: None ordered today.  Follow-Up: At [facility], you and your health needs are our priority.  As part of our continuing mission to provide you with exceptional heart care, our providers are all part of one team.  This team includes your primary Cardiologist (physician) and Advanced Practice Providers or APPs (Physician Assistants and Nurse Practitioners) who all work together to provide you with the care you need, when you need it.  Your next appointment:   As needed

## 2024-07-14 DIAGNOSIS — Z006 Encounter for examination for normal comparison and control in clinical research program: Secondary | ICD-10-CM

## 2024-07-14 NOTE — Research (Signed)
 Prevail 30 Day Post  No adverse events or cv medication changes to report at this time.
